# Patient Record
Sex: Female | Born: 1935
Health system: Southern US, Community
[De-identification: ages and names within clinical notes are randomized; demographics above are authoritative.]

## PROBLEM LIST (undated history)

## (undated) DIAGNOSIS — E7800 Pure hypercholesterolemia, unspecified: Secondary | ICD-10-CM

## (undated) DIAGNOSIS — K219 Gastro-esophageal reflux disease without esophagitis: Secondary | ICD-10-CM

## (undated) DIAGNOSIS — D649 Anemia, unspecified: Secondary | ICD-10-CM

## (undated) DIAGNOSIS — F329 Major depressive disorder, single episode, unspecified: Secondary | ICD-10-CM

## (undated) DIAGNOSIS — C50919 Malignant neoplasm of unspecified site of unspecified female breast: Secondary | ICD-10-CM

## (undated) DIAGNOSIS — M1990 Unspecified osteoarthritis, unspecified site: Secondary | ICD-10-CM

## (undated) DIAGNOSIS — Z801 Family history of malignant neoplasm of trachea, bronchus and lung: Secondary | ICD-10-CM

## (undated) DIAGNOSIS — G2581 Restless legs syndrome: Secondary | ICD-10-CM

## (undated) DIAGNOSIS — F32A Depression, unspecified: Secondary | ICD-10-CM

## (undated) DIAGNOSIS — F419 Anxiety disorder, unspecified: Secondary | ICD-10-CM

## (undated) DIAGNOSIS — I10 Essential (primary) hypertension: Secondary | ICD-10-CM

## (undated) DIAGNOSIS — R51 Headache: Secondary | ICD-10-CM

## (undated) DIAGNOSIS — C801 Malignant (primary) neoplasm, unspecified: Secondary | ICD-10-CM

## (undated) DIAGNOSIS — Z803 Family history of malignant neoplasm of breast: Secondary | ICD-10-CM

## (undated) DIAGNOSIS — K8020 Calculus of gallbladder without cholecystitis without obstruction: Secondary | ICD-10-CM

## (undated) DIAGNOSIS — R519 Headache, unspecified: Secondary | ICD-10-CM

## (undated) DIAGNOSIS — Z923 Personal history of irradiation: Secondary | ICD-10-CM

## (undated) DIAGNOSIS — N390 Urinary tract infection, site not specified: Secondary | ICD-10-CM

## (undated) HISTORY — PX: COLONOSCOPY: SHX174

## (undated) HISTORY — PX: ABDOMINAL HYSTERECTOMY: SHX81

## (undated) HISTORY — PX: EYE SURGERY: SHX253

## (undated) HISTORY — DX: Calculus of gallbladder without cholecystitis without obstruction: K80.20

## (undated) HISTORY — PX: BREAST SURGERY: SHX581

## (undated) HISTORY — PX: BREAST LUMPECTOMY: SHX2

## (undated) HISTORY — DX: Urinary tract infection, site not specified: N39.0

## (undated) HISTORY — PX: BREAST BIOPSY: SHX20

## (undated) HISTORY — PX: BREAST CYST EXCISION: SHX579

## (undated) HISTORY — DX: Family history of malignant neoplasm of trachea, bronchus and lung: Z80.1

## (undated) HISTORY — DX: Family history of malignant neoplasm of breast: Z80.3

## (undated) HISTORY — PX: MUSCLE BIOPSY: SHX716

## (undated) HISTORY — PX: CHOLECYSTECTOMY: SHX55

---

## 2000-11-05 ENCOUNTER — Ambulatory Visit (HOSPITAL_COMMUNITY): Admission: RE | Admit: 2000-11-05 | Discharge: 2000-11-05 | Payer: Self-pay | Admitting: Internal Medicine

## 2000-11-05 ENCOUNTER — Encounter: Payer: Self-pay | Admitting: Internal Medicine

## 2002-01-04 ENCOUNTER — Ambulatory Visit (HOSPITAL_COMMUNITY): Admission: RE | Admit: 2002-01-04 | Discharge: 2002-01-04 | Payer: Self-pay | Admitting: Internal Medicine

## 2002-01-04 ENCOUNTER — Encounter: Payer: Self-pay | Admitting: Internal Medicine

## 2003-05-09 ENCOUNTER — Ambulatory Visit (HOSPITAL_COMMUNITY): Admission: RE | Admit: 2003-05-09 | Discharge: 2003-05-09 | Payer: Self-pay | Admitting: Internal Medicine

## 2004-09-03 ENCOUNTER — Ambulatory Visit (HOSPITAL_COMMUNITY): Admission: RE | Admit: 2004-09-03 | Discharge: 2004-09-03 | Payer: Self-pay | Admitting: Internal Medicine

## 2004-10-03 ENCOUNTER — Ambulatory Visit: Payer: Self-pay | Admitting: Internal Medicine

## 2004-10-28 ENCOUNTER — Ambulatory Visit (HOSPITAL_COMMUNITY): Admission: RE | Admit: 2004-10-28 | Discharge: 2004-10-28 | Payer: Self-pay | Admitting: Internal Medicine

## 2004-10-28 ENCOUNTER — Ambulatory Visit: Payer: Self-pay | Admitting: Internal Medicine

## 2005-11-10 ENCOUNTER — Ambulatory Visit (HOSPITAL_COMMUNITY): Admission: RE | Admit: 2005-11-10 | Discharge: 2005-11-10 | Payer: Self-pay | Admitting: Internal Medicine

## 2006-12-24 ENCOUNTER — Ambulatory Visit (HOSPITAL_COMMUNITY): Admission: RE | Admit: 2006-12-24 | Discharge: 2006-12-24 | Payer: Self-pay | Admitting: Internal Medicine

## 2007-10-27 ENCOUNTER — Other Ambulatory Visit: Admission: RE | Admit: 2007-10-27 | Discharge: 2007-10-27 | Payer: Self-pay | Admitting: Obstetrics and Gynecology

## 2007-12-29 ENCOUNTER — Ambulatory Visit (HOSPITAL_COMMUNITY): Admission: RE | Admit: 2007-12-29 | Discharge: 2007-12-29 | Payer: Self-pay | Admitting: Internal Medicine

## 2008-12-29 ENCOUNTER — Ambulatory Visit (HOSPITAL_COMMUNITY): Admission: RE | Admit: 2008-12-29 | Discharge: 2008-12-29 | Payer: Self-pay | Admitting: Internal Medicine

## 2010-01-03 ENCOUNTER — Ambulatory Visit (HOSPITAL_COMMUNITY): Admission: RE | Admit: 2010-01-03 | Discharge: 2010-01-03 | Payer: Self-pay | Admitting: Internal Medicine

## 2010-08-23 NOTE — Op Note (Signed)
NAME:  Julie Sosa, Julie Sosa              ACCOUNT NO.:  [id number]   MEDICAL RECORD NO.:  [id number]          PATIENT TYPE:  AMB   LOCATION:  DAY                           FACILITY:  [facility]   PHYSICIAN:  R. Roetta Sessions, M.D. DATE OF BIRTH:  1935-10-13   DATE OF PROCEDURE:  10/28/2004  DATE OF DISCHARGE:                                 OPERATIVE REPORT   PROCEDURE:  Colonoscopy.   ENDOSCOPIST:  Jonathon Bellows, M.D.   INDICATIONS FOR PROCEDURE:  The patient is a 75 year old lady sent from Dr.  Ouida Sills for further evaluation of hemoccult positive stool found on digital  rectal exam.  [name] is devoid of any GI symptoms.  She is not having any  melena or hematochezia.  She had a sigmoidoscopy several years ago without  significant findings (Dr. Ouida Sills).  No family history of colorectal  neoplasia.  Colonoscopy is now being done.  This procedure was discussed  with the patient at length.  Potential risks, benefits, alternatives, and  maneuvers with any questions answered.  She is agreeable to proceed.  Oxygen  saturation, blood pressure, and pulse were monitored throughout the entire  procedure.   SEDATION:  Conscious sedation with Versed 3 mg intravenously, Demerol 50 mg  intravenously in divided doses.   INSTRUMENT:  Olympus video system.   FINDINGS:  Digital rectal exam revealed no abnormalities.  The prep was  adequate in the rectum.  Examination of the rectal mucosa including  retroflexed view of the anal verge revealed a couple of internal  hemorrhoids.  One was somewhat juicy, otherwise, her rectal mucosa appeared  normal.  Colonic mucosa was surveyed from the rectosigmoid junction to the  low transverse right colon, appendiceal orifice, ileocecal valve and  cecum.  The structures were seen and photographed for the record.  The Olympus scope  was slowly withdrawn and previously mentioned gastric surfaces were again  seen.  The patient was noted to have sigmoid diverticula.  Colonic  mucosa  appeared normal.  Attempt to intubate the terminal ileum was unsuccessful.  The patient tolerated the procedure well and was reacting in Endoscopy.   IMPRESSION:  1.  Internal hemorrhoids, otherwise normal rectum.  2.  Left-sided diverticula; colonic mucosa appeared normal.   RECOMMENDATIONS:  Will check a CBC today and see where we stand.  If she is  anemic, she will need further evaluation.  However, hemoccult positive stool  could conceivably have been related to the presence of internal hemorrhoids.       RMR/MEDQ  D:  10/28/2004  T:  10/28/2004  Job:  [id number]   cc:   Kingsley Callander. Ouida Sills, MD  [address]  [place]  [place] [address]  Fax: [phone]

## 2010-08-23 NOTE — Consult Note (Signed)
NAME:  Julie Sosa, Julie Sosa              ACCOUNT NO.:  [id number]   MEDICAL RECORD NO.:  [id number]          PATIENT TYPE:  AMB   LOCATION:  DAY                           FACILITY:  [facility]   PHYSICIAN:  R. Roetta Sessions, M.D. DATE OF BIRTH:  10/04/1935   DATE OF CONSULTATION:  DATE OF DISCHARGE:                                   CONSULTATION   REASON FOR CONSULTATION:  Heme-positive stools.   PHYSICIAN REQUESTING CONSULTATION:  Dr. Carylon Perches.   PHYSICIAN COSIGNING NOTE:  Dr. Roetta Sessions.   HISTORY OF PRESENT ILLNESS:  The patient is a 75 year old Caucasian female  patient of Dr. Carylon Perches, who presents today for further evaluation of  hemoccult-positive stool recently found on rectal examination.  She denies  any melena or gross hematochezia.  Her bowel movements sometimes are  irregular.  She has increased her dietary fiber intake and has been more  regular of lately.  She does have some fecal urgency at times.  Sometimes  she feels like she has incomplete rectal evacuation.  Denies any abdominal  pain, nausea, or vomiting.  She rarely has heartburn.  She takes Advil only  occasionally for arthritis.  She has never had a colonoscopy, but had a  flexible sigmoidoscopy by Dr. Ouida Sills in 1999.   CURRENT MEDICATIONS:  1.  Zoloft 100 mg 1/2 tablet daily.  2.  Crestor 10 mg daily.  3.  Hydrochlorothiazide 12.5 mg daily.  4.  Potassium chloride 20 mEq daily.  5.  Atenolol 50 mg daily.  6.  Requip 0.5 mg daily.  7.  Advil occasionally.   ALLERGIES:  No known drug allergies.   PAST MEDICAL HISTORY:  1.  Hypertension.  2.  Hypercholesterolemia.  3.  Depression.  4.  Restless legs syndrome.  5.  Arthritis.  6.  Status post partial hysterectomy, cholecystectomy, and left breast      biopsy, which was benign.   FAMILY HISTORY:  She has a sister, who had throat cancer and history of DVT.  She has another sister, who was treated for cervical dysplasia.  She has a  granddaughter, who is  currently being treated for cervical dysplasia.   SOCIAL HISTORY:  She is married and has two daughters.  She is retired.  She  has never been a smoker.  She denies any alcohol use.   REVIEW OF SYSTEMS:  See HPI for GI.  Constitutional:  Denies weight loss.  Cardiopulmonary:  Denies any chest pain or shortness of breath.   PHYSICAL EXAMINATION:  Weight 164, height 5 feet 4 inches, temperature 98.3,  blood pressure 124/64, pulse 64.  General:  Pleasant well-nourished well-  developed Caucasian female in no acute distress.  Skin:  Warm and dry.  No  jaundice.  HEENT:  Conjunctivae are pink.  Sclerae are nonicteric.  Oropharyngeal mucosa moist and pink.  No lesions, erythema, or exudate.  No  lymphadenopathy or thyromegaly.  Chest:  Lungs are clear to auscultation.  Cardiac exam reveals regular rate and rhythm, normal S1/S2, no murmurs,  rubs, or gallops.  Abdomen:  Positive bowel sounds, soft, nontender,  nondistended, no organomegaly or masses.  No rebound tenderness or guarding.  Extremities:  No edema.   IMPRESSION:  Julie Sosa is a 75 year old lady, who recently was found to  have Hemoccult-positive stool on digital rectal examination. She has never  had a complete colonoscopy.  She needs to have a colonoscopy at this time to  rule out colorectal polyps or colorectal cancer.  She is otherwise devoid of  any significant GI symptoms or alarm symptoms.  Denies any upper GI symptoms  at all.  I have discussed risks, alternatives, and benefits with regards to  colonoscopy and including risk of medication reactions, incomplete exam,  bleeding, infection, perforation, and she is agreeable to proceed.   PLAN:  Colonoscopy with Dr. Jena Gauss in the near future.       LL/MEDQ  D:  10/03/2004  T:  10/03/2004  Job:  [id number]   cc:   Kingsley Callander. Ouida Sills, MD  [address]  [place]  [place] [address]  Fax: [phone]

## 2010-11-13 DIAGNOSIS — C439 Malignant melanoma of skin, unspecified: Secondary | ICD-10-CM

## 2010-11-13 HISTORY — DX: Malignant melanoma of skin, unspecified: C43.9

## 2011-01-13 ENCOUNTER — Other Ambulatory Visit (HOSPITAL_COMMUNITY): Payer: Self-pay | Admitting: Internal Medicine

## 2011-01-13 DIAGNOSIS — Z139 Encounter for screening, unspecified: Secondary | ICD-10-CM

## 2011-01-16 ENCOUNTER — Ambulatory Visit (HOSPITAL_COMMUNITY)
Admission: RE | Admit: 2011-01-16 | Discharge: 2011-01-16 | Disposition: A | Discharge status: Home / Self Care | Payer: Medicare HMO | Source: Ambulatory Visit | Attending: Internal Medicine | Admitting: Internal Medicine

## 2011-01-16 DIAGNOSIS — Z139 Encounter for screening, unspecified: Secondary | ICD-10-CM

## 2011-01-16 DIAGNOSIS — Z1231 Encounter for screening mammogram for malignant neoplasm of breast: Secondary | ICD-10-CM | POA: Insufficient documentation

## 2011-04-17 ENCOUNTER — Ambulatory Visit (HOSPITAL_COMMUNITY)
Admission: RE | Admit: 2011-04-17 | Discharge: 2011-04-17 | Disposition: A | Discharge status: Home / Self Care | Payer: MEDICARE | Source: Ambulatory Visit | Attending: Internal Medicine | Admitting: Internal Medicine

## 2011-04-17 ENCOUNTER — Other Ambulatory Visit (HOSPITAL_COMMUNITY): Payer: Self-pay | Admitting: Internal Medicine

## 2011-04-17 DIAGNOSIS — R05 Cough: Secondary | ICD-10-CM

## 2011-04-17 DIAGNOSIS — R059 Cough, unspecified: Secondary | ICD-10-CM | POA: Insufficient documentation

## 2011-12-26 ENCOUNTER — Other Ambulatory Visit (HOSPITAL_COMMUNITY): Payer: Self-pay | Admitting: Internal Medicine

## 2011-12-26 DIAGNOSIS — Z139 Encounter for screening, unspecified: Secondary | ICD-10-CM

## 2012-01-19 ENCOUNTER — Ambulatory Visit (HOSPITAL_COMMUNITY)
Admission: RE | Admit: 2012-01-19 | Discharge: 2012-01-19 | Disposition: A | Discharge status: Home / Self Care | Payer: Medicare Other | Source: Ambulatory Visit | Attending: Internal Medicine | Admitting: Internal Medicine

## 2012-01-19 DIAGNOSIS — Z1231 Encounter for screening mammogram for malignant neoplasm of breast: Secondary | ICD-10-CM | POA: Insufficient documentation

## 2012-01-19 DIAGNOSIS — Z139 Encounter for screening, unspecified: Secondary | ICD-10-CM

## 2012-02-24 ENCOUNTER — Encounter (HOSPITAL_COMMUNITY): Payer: Self-pay | Admitting: Pharmacy Technician

## 2012-03-01 ENCOUNTER — Encounter (HOSPITAL_COMMUNITY)
Admission: RE | Admit: 2012-03-01 | Discharge: 2012-03-01 | Payer: Medicare Other | Source: Ambulatory Visit | Attending: Ophthalmology | Admitting: Ophthalmology

## 2012-03-01 ENCOUNTER — Encounter (HOSPITAL_COMMUNITY): Payer: Self-pay

## 2012-03-01 ENCOUNTER — Other Ambulatory Visit: Payer: Self-pay

## 2012-03-01 HISTORY — DX: Anxiety disorder, unspecified: F41.9

## 2012-03-01 HISTORY — DX: Pure hypercholesterolemia, unspecified: E78.00

## 2012-03-01 HISTORY — DX: Restless legs syndrome: G25.81

## 2012-03-01 HISTORY — DX: Major depressive disorder, single episode, unspecified: F32.9

## 2012-03-01 HISTORY — DX: Essential (primary) hypertension: I10

## 2012-03-01 HISTORY — DX: Unspecified osteoarthritis, unspecified site: M19.90

## 2012-03-01 HISTORY — DX: Depression, unspecified: F32.A

## 2012-03-01 LAB — BASIC METABOLIC PANEL
BUN: 14 mg/dL (ref 6–23)
GFR calc Af Amer: 61 mL/min — ABNORMAL LOW (ref >90)
GFR calc non Af Amer: 53 mL/min — ABNORMAL LOW (ref >90)
Potassium: 3.8 mEq/L (ref 3.5–5.1)
Sodium: 137 mEq/L (ref 135–145)

## 2012-03-01 LAB — HEMOGLOBIN AND HEMATOCRIT, BLOOD
HCT: 37.6 % (ref 36.0–46.0)
Hemoglobin: 12.6 g/dL (ref 12.0–15.0)

## 2012-03-01 NOTE — Patient Instructions (Addendum)
Your procedure is scheduled on: 03/11/2012  Report to [facility] at   800     AM.  Call this number if you have problems the morning of surgery: [phone]   Do not eat food or drink liquids :After Midnight.      Take these medicines the morning of surgery with A SIP OF WATER: atenolol,zoloft,clonazepam   Do not wear jewelry, make-up or nail polish.  Do not wear lotions, powders, or perfumes. You may wear deodorant.  Do not shave 48 hours prior to surgery.  Do not bring valuables to the hospital.  Contacts, dentures or bridgework may not be worn into surgery.  Leave suitcase in the car. After surgery it may be brought to your room.  For patients admitted to the hospital, checkout time is 11:00 AM the day of discharge.   Patients discharged the day of surgery will not be allowed to drive home.  :     Please read over the following fact sheets that you were given: Coughing and Deep Breathing, Surgical Site Infection Prevention, Anesthesia Post-op Instructions and Care and Recovery After Surgery    Cataract A cataract is a clouding of the lens of the eye. When a lens becomes cloudy, vision is reduced based on the degree and nature of the clouding. Many cataracts reduce vision to some degree. Some cataracts make people more near-sighted as they develop. Other cataracts increase glare. Cataracts that are ignored and become worse can sometimes look white. The white color can be seen through the pupil. CAUSES   Aging. However, cataracts may occur at any age, even in newborns.   Certain drugs.   Trauma to the eye.   Certain diseases such as diabetes.   Specific eye diseases such as chronic inflammation inside the eye or a sudden attack of a rare form of glaucoma.   Inherited or acquired medical problems.  SYMPTOMS   Gradual, progressive drop in vision in the affected eye.   Severe, rapid visual loss. This most often happens when trauma is the cause.  DIAGNOSIS  To detect a cataract,  an eye doctor examines the lens. Cataracts are best diagnosed with an exam of the eyes with the pupils enlarged (dilated) by drops.  TREATMENT  For an early cataract, vision may improve by using different eyeglasses or stronger lighting. If that does not help your vision, surgery is the only effective treatment. A cataract needs to be surgically removed when vision loss interferes with your everyday activities, such as driving, reading, or watching TV. A cataract may also have to be removed if it prevents examination or treatment of another eye problem. Surgery removes the cloudy lens and usually replaces it with a substitute lens (intraocular lens, IOL).  At a time when both you and your doctor agree, the cataract will be surgically removed. If you have cataracts in both eyes, only one is usually removed at a time. This allows the operated eye to heal and be out of danger from any possible problems after surgery (such as infection or poor wound healing). In rare cases, a cataract may be doing damage to your eye. In these cases, your caregiver may advise surgical removal right away. The vast majority of people who have cataract surgery have better vision afterward. HOME CARE INSTRUCTIONS  If you are not planning surgery, you may be asked to do the following:  Use different eyeglasses.   Use stronger or brighter lighting.   Ask your eye doctor about reducing  your medicine dose or changing medicines if it is thought that a medicine caused your cataract. Changing medicines does not make the cataract go away on its own.   Become familiar with your surroundings. Poor vision can lead to injury. Avoid bumping into things on the affected side. You are at a higher risk for tripping or falling.   Exercise extreme care when driving or operating machinery.   Wear sunglasses if you are sensitive to bright light or experiencing problems with glare.  SEEK IMMEDIATE MEDICAL CARE IF:   You have a worsening or  sudden vision loss.   You notice redness, swelling, or increasing pain in the eye.   You have a fever.  Document Released: 03/24/2005 Document Revised: 03/13/2011 Document Reviewed: 11/15/2010 [facility] Patient Information 2012 [place], [place].PATIENT INSTRUCTIONS POST-ANESTHESIA  IMMEDIATELY FOLLOWING SURGERY:  Do not drive or operate machinery for the first twenty four hours after surgery.  Do not make any important decisions for twenty four hours after surgery or while taking narcotic pain medications or sedatives.  If you develop intractable nausea and vomiting or a severe headache please notify your doctor immediately.  FOLLOW-UP:  Please make an appointment with your surgeon as instructed. You do not need to follow up with anesthesia unless specifically instructed to do so.  WOUND CARE INSTRUCTIONS (if applicable):  Keep a dry clean dressing on the anesthesia/puncture wound site if there is drainage.  Once the wound has quit draining you may leave it open to air.  Generally you should leave the bandage intact for twenty four hours unless there is drainage.  If the epidural site drains for more than 36-48 hours please call the anesthesia department.  QUESTIONS?:  Please feel free to call your physician or the hospital operator if you have any questions, and they will be happy to assist you.

## 2012-03-10 MED ORDER — LIDOCAINE HCL (PF) 1 % IJ SOLN
INTRAMUSCULAR | Status: AC
  Filled 2012-03-10: qty 2

## 2012-03-10 MED ORDER — PHENYLEPHRINE HCL 2.5 % OP SOLN
OPHTHALMIC | Status: AC
  Filled 2012-03-10: qty 2

## 2012-03-10 MED ORDER — LIDOCAINE HCL 3.5 % OP GEL
OPHTHALMIC | Status: AC
  Filled 2012-03-10: qty 5

## 2012-03-10 MED ORDER — CYCLOPENTOLATE-PHENYLEPHRINE 0.2-1 % OP SOLN
OPHTHALMIC | Status: AC
  Filled 2012-03-10: qty 2

## 2012-03-10 MED ORDER — TETRACAINE HCL 0.5 % OP SOLN
OPHTHALMIC | Status: AC
  Filled 2012-03-10: qty 2

## 2012-03-10 MED ORDER — NEOMYCIN-POLYMYXIN-DEXAMETH 3.5-10000-0.1 OP OINT
TOPICAL_OINTMENT | OPHTHALMIC | Status: AC
  Filled 2012-03-10: qty 3.5

## 2012-03-11 ENCOUNTER — Encounter (HOSPITAL_COMMUNITY): Payer: Self-pay | Admitting: *Deleted

## 2012-03-11 ENCOUNTER — Encounter (HOSPITAL_COMMUNITY): Payer: Self-pay | Admitting: Anesthesiology

## 2012-03-11 ENCOUNTER — Ambulatory Visit (HOSPITAL_COMMUNITY): Payer: Medicare Other | Admitting: Anesthesiology

## 2012-03-11 ENCOUNTER — Ambulatory Visit (HOSPITAL_COMMUNITY)
Admission: RE | Admit: 2012-03-11 | Discharge: 2012-03-11 | Disposition: A | Discharge status: Home / Self Care | Payer: Medicare Other | Source: Ambulatory Visit | Attending: Ophthalmology | Admitting: Ophthalmology

## 2012-03-11 ENCOUNTER — Encounter (HOSPITAL_COMMUNITY)
Admission: RE | Disposition: A | Discharge status: Home / Self Care | Payer: Self-pay | Source: Ambulatory Visit | Attending: Ophthalmology

## 2012-03-11 DIAGNOSIS — Z01812 Encounter for preprocedural laboratory examination: Secondary | ICD-10-CM | POA: Insufficient documentation

## 2012-03-11 DIAGNOSIS — I10 Essential (primary) hypertension: Secondary | ICD-10-CM | POA: Insufficient documentation

## 2012-03-11 DIAGNOSIS — Z01810 Encounter for preprocedural cardiovascular examination: Secondary | ICD-10-CM | POA: Insufficient documentation

## 2012-03-11 DIAGNOSIS — H2510 Age-related nuclear cataract, unspecified eye: Secondary | ICD-10-CM | POA: Insufficient documentation

## 2012-03-11 HISTORY — PX: CATARACT EXTRACTION W/PHACO: SHX586

## 2012-03-11 SURGERY — PHACOEMULSIFICATION, CATARACT, WITH IOL INSERTION
Anesthesia: Monitor Anesthesia Care | Site: Eye | Laterality: Right | Wound class: Clean

## 2012-03-11 MED ORDER — LIDOCAINE HCL (PF) 1 % IJ SOLN
INTRAMUSCULAR | Status: DC | PRN
  Administered 2012-03-11: .4 mL

## 2012-03-11 MED ORDER — LACTATED RINGERS IV SOLN
INTRAVENOUS | Status: DC
  Administered 2012-03-11: 08:00:00 via INTRAVENOUS

## 2012-03-11 MED ORDER — LIDOCAINE HCL 3.5 % OP GEL
1.0000 "application " | Freq: Once | OPHTHALMIC | Status: AC
  Administered 2012-03-11: 1 via OPHTHALMIC

## 2012-03-11 MED ORDER — CYCLOPENTOLATE-PHENYLEPHRINE 0.2-1 % OP SOLN
1.0000 [drp] | OPHTHALMIC | Status: AC
  Administered 2012-03-11 (×3): 1 [drp] via OPHTHALMIC

## 2012-03-11 MED ORDER — MIDAZOLAM HCL 2 MG/2ML IJ SOLN
INTRAMUSCULAR | Status: AC
  Filled 2012-03-11: qty 2

## 2012-03-11 MED ORDER — POVIDONE-IODINE 5 % OP SOLN
OPHTHALMIC | Status: DC | PRN
  Administered 2012-03-11: 1 via OPHTHALMIC

## 2012-03-11 MED ORDER — EPINEPHRINE HCL 1 MG/ML IJ SOLN
INTRAMUSCULAR | Status: AC
  Filled 2012-03-11: qty 1

## 2012-03-11 MED ORDER — NEOMYCIN-POLYMYXIN-DEXAMETH 0.1 % OP OINT
TOPICAL_OINTMENT | OPHTHALMIC | Status: DC | PRN
  Administered 2012-03-11: 1 via OPHTHALMIC

## 2012-03-11 MED ORDER — PHENYLEPHRINE HCL 2.5 % OP SOLN
1.0000 [drp] | OPHTHALMIC | Status: AC
  Administered 2012-03-11 (×3): 1 [drp] via OPHTHALMIC

## 2012-03-11 MED ORDER — LIDOCAINE 3.5 % OP GEL OPTIME - NO CHARGE
OPHTHALMIC | Status: DC | PRN
  Administered 2012-03-11: 1 [drp] via OPHTHALMIC

## 2012-03-11 MED ORDER — EPINEPHRINE HCL 1 MG/ML IJ SOLN
INTRAOCULAR | Status: DC | PRN
  Administered 2012-03-11: 09:00:00

## 2012-03-11 MED ORDER — MIDAZOLAM HCL 2 MG/2ML IJ SOLN
1.0000 mg | INTRAMUSCULAR | Status: AC | PRN
  Administered 2012-03-11 (×2): 1 mg via INTRAVENOUS
  Administered 2012-03-11: 2 mg via INTRAVENOUS

## 2012-03-11 MED ORDER — TETRACAINE HCL 0.5 % OP SOLN
1.0000 [drp] | OPHTHALMIC | Status: AC
  Administered 2012-03-11 (×3): 1 [drp] via OPHTHALMIC

## 2012-03-11 MED ORDER — PROVISC 10 MG/ML IO SOLN
INTRAOCULAR | Status: DC | PRN
  Administered 2012-03-11: 8.5 mg via INTRAOCULAR

## 2012-03-11 MED ORDER — BSS IO SOLN
INTRAOCULAR | Status: DC | PRN
  Administered 2012-03-11: 15 mL via INTRAOCULAR

## 2012-03-11 SURGICAL SUPPLY — 32 items

## 2012-03-11 NOTE — Anesthesia Postprocedure Evaluation (Signed)
  Anesthesia Post-op Note  Patient: Julie Sosa  Procedure(s) Performed: Procedure(s) (LRB) with comments: CATARACT EXTRACTION PHACO AND INTRAOCULAR LENS PLACEMENT (IOC) (Right) - CDE:17.71  Patient Location: Short Stay  Anesthesia Type:MAC  Level of Consciousness: awake, alert , oriented and patient cooperative  Airway and Oxygen Therapy: Patient Spontanous Breathing  Post-op Pain: none  Post-op Assessment: Post-op Vital signs reviewed, Patient's Cardiovascular Status Stable, Respiratory Function Stable, Patent Airway, No signs of Nausea or vomiting and Adequate PO intake  Post-op Vital Signs: Reviewed and stable  Complications: No apparent anesthesia complications

## 2012-03-11 NOTE — Brief Op Note (Signed)
Pre-Op Dx: Cataract OD Post-Op Dx: Cataract OD Surgeon: Cortina Vultaggio Anesthesia: Topical with MAC Surgery: Cataract Extraction with Intraocular lens Implant OD Implant: B&L enVista Specimen: None Complications: None 

## 2012-03-11 NOTE — Op Note (Signed)
NAME:  Julie Sosa, Julie Sosa              ACCOUNT NO.:  [id number]  MEDICAL RECORD NO.:  [id number]  LOCATION:  APPO                          FACILITY:  [facility]  PHYSICIAN:  Susanne Greenhouse, MD       DATE OF BIRTH:  1935/09/15  DATE OF PROCEDURE:  03/11/2012 DATE OF DISCHARGE:  03/11/2012                              OPERATIVE REPORT   PREOPERATIVE DIAGNOSIS:  Nuclear cataract, right eye, diagnosis code 366.16.  POSTOPERATIVE DIAGNOSIS:  Nuclear cataract, right eye, diagnosis code 366.16.  SURGEON:  Susanne Greenhouse, MD  OPERATION PERFORMED:  Phacoemulsification with posterior chamber intraocular lens implantation, right eye.  ANESTHESIA:  Topical with monitored anesthesia care and IV sedation.  OPERATIVE SUMMARY:  In the preoperative area, dilating drops were placed into the right eye.  The patient was then brought into the operating room where she was placed under topical anesthesia and IV sedation.  The eye was then prepped and draped.  Beginning with a 75 blade, a paracentesis port was made at the surgeon's 2 o'clock position.  The anterior chamber was then filled with a 1% nonpreserved lidocaine solution with epinephrine.  This was followed by Viscoat to deepen the chamber.  A small fornix-based peritomy was performed superiorly.  Next, a single iris hook was placed through the limbus superiorly.  A 2.4-mm keratome blade was then used to make a clear corneal incision over the iris hook.  A bent cystotome needle and Utrata forceps were used to create a continuous tear capsulotomy.  Hydrodissection was performed using balanced salt solution on a fine cannula.  The lens nucleus was then removed using phacoemulsification in a quadrant cracking technique. The cortical material was then removed with irrigation and aspiration. The capsular bag and anterior chamber were refilled with Provisc.  The wound was widened to approximately 3 mm and a posterior chamber intraocular lens was placed into  the capsular bag without difficulty using an [employer] lens injecting system.  A single 10-0 nylon suture was then used to close the incision as well as stromal hydration. The Provisc was removed from the anterior chamber and capsular bag with irrigation and aspiration.  At this point, the wounds were tested for leak, which were negative.  The anterior chamber remained deep and stable.  The patient tolerated the procedure well.  There were no operative complications, and she awoke from topical anesthesia and IV sedation without problem.  No surgical specimens.  Prosthetic device used is a Bausch and Lomb posterior chamber lens, model enVista, model number MX60, power of 23.5, serial number is [id number].          ______________________________ Susanne Greenhouse, MD     KEH/MEDQ  D:  03/11/2012  T:  03/11/2012  Job:  [id number]

## 2012-03-11 NOTE — H&P (Signed)
I have reviewed the H&P, the patient was re-examined, and I have identified no interval changes in medical condition and plan of care since the history and physical of record  

## 2012-03-11 NOTE — Transfer of Care (Signed)
Immediate Anesthesia Transfer of Care Note  Patient: Julie Sosa  Procedure(s) Performed: Procedure(s) (LRB) with comments: CATARACT EXTRACTION PHACO AND INTRAOCULAR LENS PLACEMENT (IOC) (Right) - CDE:17.71  Patient Location: Short Stay  Anesthesia Type:MAC  Level of Consciousness: awake, alert , oriented and patient cooperative  Airway & Oxygen Therapy: Patient Spontanous Breathing  Post-op Assessment: Report given to PACU RN, Post -op Vital signs reviewed and stable and Patient moving all extremities  Post vital signs: Reviewed and stable  Complications: No apparent anesthesia complications

## 2012-03-11 NOTE — Anesthesia Preprocedure Evaluation (Signed)
Anesthesia Evaluation  Patient identified by MRN, date of birth, ID band Patient awake    Reviewed: Allergy & Precautions, H&P , NPO status , Patient's Chart, lab work & pertinent test results, reviewed documented beta blocker date and time   Airway Mallampati: II      Dental  (+) Teeth Intact   Pulmonary neg pulmonary ROS,  breath sounds clear to auscultation        Cardiovascular hypertension, Pt. on medications Rhythm:Regular Rate:Normal     Neuro/Psych PSYCHIATRIC DISORDERS Anxiety Depression    GI/Hepatic negative GI ROS,   Endo/Other    Renal/GU      Musculoskeletal   Abdominal   Peds  Hematology   Anesthesia Other Findings   Reproductive/Obstetrics                           Anesthesia Physical Anesthesia Plan  ASA: II  Anesthesia Plan: MAC   Post-op Pain Management:    Induction: Intravenous  Airway Management Planned: Nasal Cannula  Additional Equipment:   Intra-op Plan:   Post-operative Plan:   Informed Consent: I have reviewed the patients History and Physical, chart, labs and discussed the procedure including the risks, benefits and alternatives for the proposed anesthesia with the patient or authorized representative who has indicated his/her understanding and acceptance.     Plan Discussed with:   Anesthesia Plan Comments:         Anesthesia Quick Evaluation

## 2012-03-15 ENCOUNTER — Encounter (HOSPITAL_COMMUNITY): Payer: Self-pay | Admitting: Ophthalmology

## 2012-03-19 ENCOUNTER — Encounter (HOSPITAL_COMMUNITY): Payer: Self-pay | Admitting: Pharmacy Technician

## 2012-03-23 ENCOUNTER — Encounter (HOSPITAL_COMMUNITY)
Admission: RE | Admit: 2012-03-23 | Discharge: 2012-03-23 | Payer: Medicare Other | Source: Ambulatory Visit | Admitting: Ophthalmology

## 2012-03-23 ENCOUNTER — Encounter (HOSPITAL_COMMUNITY): Payer: Self-pay

## 2012-03-23 NOTE — Patient Instructions (Signed)
[address] [name]  03/23/2012   Your procedure is scheduled on:  03/29/2012  Report to [facility] at 7:30 am AM.  Call this number if you have problems the morning of surgery: [phone]   Remember:   Do not eat food:After Midnight.  May have clear liquids:until Midnight .  Clear liquids include soda, tea, black coffee, apple or grape juice, broth.  Take these medicines the morning of surgery with A SIP OF WATER: atenolol   Do not wear jewelry, make-up or nail polish.  Do not wear lotions, powders, or perfumes. You may wear deodorant.  Do not shave 48 hours prior to surgery. Men may shave face and neck.  Do not bring valuables to the hospital.  Contacts, dentures or bridgework may not be worn into surgery.  Leave suitcase in the car. After surgery it may be brought to your room.  For patients admitted to the hospital, checkout time is 11:00 AM the day of discharge.   Patients discharged the day of surgery will not be allowed to drive home.  Name and phone number of your driver: Family  Special Instructions: N/A   Please read over the following fact sheets that you were given: Care and Recovery After Surgery

## 2012-03-26 MED ORDER — ONDANSETRON HCL 4 MG/2ML IJ SOLN: 4.0000 mg | Freq: Once | INTRAMUSCULAR | Status: AC | PRN

## 2012-03-26 MED ORDER — FENTANYL CITRATE 0.05 MG/ML IJ SOLN: 25.0000 ug | INTRAMUSCULAR | Status: DC | PRN

## 2012-03-29 ENCOUNTER — Ambulatory Visit (HOSPITAL_COMMUNITY): Payer: Medicare Other | Admitting: Anesthesiology

## 2012-03-29 ENCOUNTER — Encounter (HOSPITAL_COMMUNITY): Payer: Self-pay | Admitting: *Deleted

## 2012-03-29 ENCOUNTER — Ambulatory Visit (HOSPITAL_COMMUNITY)
Admission: RE | Admit: 2012-03-29 | Discharge: 2012-03-29 | Disposition: A | Discharge status: Home / Self Care | Payer: Medicare Other | Source: Ambulatory Visit | Attending: Ophthalmology | Admitting: Ophthalmology

## 2012-03-29 ENCOUNTER — Encounter (HOSPITAL_COMMUNITY)
Admission: RE | Disposition: A | Discharge status: Home / Self Care | Payer: Self-pay | Source: Ambulatory Visit | Attending: Ophthalmology

## 2012-03-29 ENCOUNTER — Encounter (HOSPITAL_COMMUNITY): Payer: Self-pay | Admitting: Anesthesiology

## 2012-03-29 DIAGNOSIS — H2510 Age-related nuclear cataract, unspecified eye: Secondary | ICD-10-CM | POA: Insufficient documentation

## 2012-03-29 DIAGNOSIS — I10 Essential (primary) hypertension: Secondary | ICD-10-CM | POA: Insufficient documentation

## 2012-03-29 HISTORY — PX: CATARACT EXTRACTION W/PHACO: SHX586

## 2012-03-29 SURGERY — PHACOEMULSIFICATION, CATARACT, WITH IOL INSERTION
Anesthesia: Monitor Anesthesia Care | Site: Eye | Laterality: Left | Wound class: Clean

## 2012-03-29 MED ORDER — LACTATED RINGERS IV SOLN
INTRAVENOUS | Status: DC
  Administered 2012-03-29: 1000 mL via INTRAVENOUS

## 2012-03-29 MED ORDER — POVIDONE-IODINE 5 % OP SOLN
OPHTHALMIC | Status: DC | PRN
  Administered 2012-03-29: 1 via OPHTHALMIC

## 2012-03-29 MED ORDER — LIDOCAINE HCL 3.5 % OP GEL
1.0000 "application " | Freq: Once | OPHTHALMIC | Status: AC
  Administered 2012-03-29: 1 via OPHTHALMIC

## 2012-03-29 MED ORDER — MIDAZOLAM HCL 2 MG/2ML IJ SOLN
INTRAMUSCULAR | Status: AC
  Filled 2012-03-29: qty 2

## 2012-03-29 MED ORDER — TETRACAINE HCL 0.5 % OP SOLN
1.0000 [drp] | OPHTHALMIC | Status: AC
  Administered 2012-03-29 (×3): 1 [drp] via OPHTHALMIC

## 2012-03-29 MED ORDER — MIDAZOLAM HCL 2 MG/2ML IJ SOLN
1.0000 mg | INTRAMUSCULAR | Status: DC | PRN
  Administered 2012-03-29: 2 mg via INTRAVENOUS

## 2012-03-29 MED ORDER — CYCLOPENTOLATE-PHENYLEPHRINE 0.2-1 % OP SOLN
1.0000 [drp] | OPHTHALMIC | Status: AC
  Administered 2012-03-29 (×3): 1 [drp] via OPHTHALMIC

## 2012-03-29 MED ORDER — EPINEPHRINE HCL 1 MG/ML IJ SOLN
INTRAOCULAR | Status: DC | PRN
  Administered 2012-03-29: 09:00:00

## 2012-03-29 MED ORDER — PHENYLEPHRINE HCL 2.5 % OP SOLN
1.0000 [drp] | OPHTHALMIC | Status: AC
  Administered 2012-03-29 (×3): 1 [drp] via OPHTHALMIC

## 2012-03-29 MED ORDER — PROVISC 10 MG/ML IO SOLN
INTRAOCULAR | Status: DC | PRN
  Administered 2012-03-29: 8.5 mg via INTRAOCULAR

## 2012-03-29 MED ORDER — LIDOCAINE HCL (PF) 1 % IJ SOLN
INTRAMUSCULAR | Status: DC | PRN
  Administered 2012-03-29: .3 mL

## 2012-03-29 MED ORDER — BSS IO SOLN
INTRAOCULAR | Status: DC | PRN
  Administered 2012-03-29: 15 mL via INTRAOCULAR

## 2012-03-29 MED ORDER — NEOMYCIN-POLYMYXIN-DEXAMETH 0.1 % OP OINT
TOPICAL_OINTMENT | OPHTHALMIC | Status: DC | PRN
  Administered 2012-03-29: 1 via OPHTHALMIC

## 2012-03-29 SURGICAL SUPPLY — 32 items

## 2012-03-29 NOTE — Brief Op Note (Signed)
Pre-Op Dx: Cataract OS Post-Op Dx: Cataract OS Surgeon: Cherrise Occhipinti Anesthesia: Topical with MAC Surgery: Cataract Extraction with Intraocular lens Implant OS Implant: B&L enVista Specimen: None Complications: None 

## 2012-03-29 NOTE — H&P (Signed)
I have reviewed the H&P, the patient was re-examined, and I have identified no interval changes in medical condition and plan of care since the history and physical of record  

## 2012-03-29 NOTE — Anesthesia Procedure Notes (Signed)
Date/Time: 03/29/2012 8:42 AM Performed by: Franco Nones

## 2012-03-29 NOTE — Anesthesia Preprocedure Evaluation (Signed)
Anesthesia Evaluation  Patient identified by MRN, date of birth, ID band Patient awake    Reviewed: Allergy & Precautions, H&P , NPO status , Patient's Chart, lab work & pertinent test results, reviewed documented beta blocker date and time   Airway Mallampati: II      Dental  (+) Teeth Intact   Pulmonary neg pulmonary ROS,  breath sounds clear to auscultation        Cardiovascular hypertension, Pt. on medications Rhythm:Regular Rate:Normal     Neuro/Psych PSYCHIATRIC DISORDERS Anxiety Depression    GI/Hepatic negative GI ROS,   Endo/Other    Renal/GU      Musculoskeletal   Abdominal   Peds  Hematology   Anesthesia Other Findings   Reproductive/Obstetrics                           Anesthesia Physical Anesthesia Plan  ASA: II  Anesthesia Plan: MAC   Post-op Pain Management:    Induction: Intravenous  Airway Management Planned: Nasal Cannula  Additional Equipment:   Intra-op Plan:   Post-operative Plan:   Informed Consent: I have reviewed the patients History and Physical, chart, labs and discussed the procedure including the risks, benefits and alternatives for the proposed anesthesia with the patient or authorized representative who has indicated his/her understanding and acceptance.     Plan Discussed with:   Anesthesia Plan Comments:         Anesthesia Quick Evaluation  

## 2012-03-29 NOTE — Anesthesia Postprocedure Evaluation (Signed)
  Anesthesia Post-op Note  Patient: Julie Sosa  Procedure(s) Performed: Procedure(s) (LRB): CATARACT EXTRACTION PHACO AND INTRAOCULAR LENS PLACEMENT (IOC) (Left)  Patient Location:  Short Stay  Anesthesia Type: MAC  Level of Consciousness: awake  Airway and Oxygen Therapy: Patient Spontanous Breathing  Post-op Pain: none  Post-op Assessment: Post-op Vital signs reviewed, Patient's Cardiovascular Status Stable, Respiratory Function Stable, Patent Airway, No signs of Nausea or vomiting and Pain level controlled  Post-op Vital Signs: Reviewed and stable  Complications: No apparent anesthesia complications

## 2012-03-29 NOTE — Op Note (Signed)
NAME:  Julie Sosa, Julie Sosa              ACCOUNT NO.:  [id number]  MEDICAL RECORD NO.:  [id number]  LOCATION:  [facility]                          FACILITY:  [facility]  PHYSICIAN:  Susanne Greenhouse, MD       DATE OF BIRTH:  [birth year]  DATE OF PROCEDURE:  03/29/2012 DATE OF DISCHARGE:  03/29/2012                              OPERATIVE REPORT   PREOPERATIVE DIAGNOSIS:  Nuclear cataract, left eye, diagnosis code 366.16.  POSTOPERATIVE DIAGNOSIS:  Nuclear cataract, left eye, diagnosis code 366.16.  OPERATION PERFORMED:  Phacoemulsification with posterior chamber intraocular lens implantation, left eye.  SURGEON:  Bonne Dolores. Antoneo Ghrist, MD.  ANESTHESIA:  Topical with monitored anesthesia care and IV sedation.  OPERATIVE SUMMARY:  In the preoperative area, dilating drops were placed into the left eye.  The patient was then brought into the operating room where she was placed under topical anesthesia and IV sedation.  The eye was then prepped and draped.  Beginning with a 75 blade, a paracentesis port was made at the surgeon's 2 o'clock position.  The anterior chamber was then filled with a 1% nonpreserved lidocaine solution with epinephrine.  This was followed by Viscoat to deepen the chamber.  A small fornix-based peritomy was performed superiorly.  Next, a single iris hook was placed through the limbus superiorly.  A 2.4-mm keratome blade was then used to make a clear corneal incision over the iris hook. A bent cystotome needle and Utrata forceps were used to create a continuous tear capsulotomy.  Hydrodissection was performed using balanced salt solution on a fine cannula.  The lens nucleus was then removed using phacoemulsification in a quadrant cracking technique.  The cortical material was then removed with irrigation and aspiration.  The capsular bag and anterior chamber were refilled with Provisc.  The wound was widened to approximately 3 mm and a posterior chamber intraocular lens was placed into the  capsular bag without difficulty using an [employer] lens injecting system.  A single 10-0 nylon suture was then used to close the incision as well as stromal hydration.  The Provisc was removed from the anterior chamber and capsular bag with irrigation and aspiration.  At this point, the wounds were tested for leak, which were negative.  The anterior chamber remained deep and stable.  The patient tolerated the procedure well.  There were no operative complications, and she awoke from topical anesthesia and IV sedation without problem.  No surgical specimens.  Prosthetic device used is a Theme park manager, model [employer], model number MX60 power of 23.5, serial number is [id number].          ______________________________ Susanne Greenhouse, MD     KEH/MEDQ  D:  03/29/2012  T:  03/29/2012  Job:  [id number]

## 2012-03-29 NOTE — Transfer of Care (Signed)
Immediate Anesthesia Transfer of Care Note  Patient: Julie Sosa  Procedure(s) Performed: Procedure(s) (LRB): CATARACT EXTRACTION PHACO AND INTRAOCULAR LENS PLACEMENT (IOC) (Left)  Patient Location: Shortstay  Anesthesia Type: MAC  Level of Consciousness: awake  Airway & Oxygen Therapy: Patient Spontanous Breathing   Post-op Assessment: Report given to PACU RN, Post -op Vital signs reviewed and stable and Patient moving all extremities  Post vital signs: Reviewed and stable  Complications: No apparent anesthesia complications

## 2012-04-02 ENCOUNTER — Encounter (HOSPITAL_COMMUNITY): Payer: Self-pay | Admitting: Ophthalmology

## 2012-12-31 ENCOUNTER — Other Ambulatory Visit (HOSPITAL_COMMUNITY): Payer: Self-pay | Admitting: Internal Medicine

## 2012-12-31 DIAGNOSIS — Z139 Encounter for screening, unspecified: Secondary | ICD-10-CM

## 2013-01-20 ENCOUNTER — Ambulatory Visit (HOSPITAL_COMMUNITY)
Admission: RE | Admit: 2013-01-20 | Discharge: 2013-01-20 | Disposition: A | Discharge status: Home / Self Care | Payer: Medicare Other | Source: Ambulatory Visit | Attending: Internal Medicine | Admitting: Internal Medicine

## 2013-01-20 DIAGNOSIS — Z139 Encounter for screening, unspecified: Secondary | ICD-10-CM

## 2013-01-20 DIAGNOSIS — Z1231 Encounter for screening mammogram for malignant neoplasm of breast: Secondary | ICD-10-CM | POA: Insufficient documentation

## 2014-01-09 ENCOUNTER — Other Ambulatory Visit (HOSPITAL_COMMUNITY): Payer: Self-pay | Admitting: Internal Medicine

## 2014-01-09 DIAGNOSIS — Z139 Encounter for screening, unspecified: Secondary | ICD-10-CM

## 2014-01-23 ENCOUNTER — Ambulatory Visit (HOSPITAL_COMMUNITY): Payer: Medicare Other

## 2014-01-26 ENCOUNTER — Ambulatory Visit (HOSPITAL_COMMUNITY)
Admission: RE | Admit: 2014-01-26 | Discharge: 2014-01-26 | Disposition: A | Discharge status: Home / Self Care | Payer: Medicare HMO | Source: Ambulatory Visit | Attending: Internal Medicine | Admitting: Internal Medicine

## 2014-01-26 DIAGNOSIS — Z139 Encounter for screening, unspecified: Secondary | ICD-10-CM

## 2014-01-26 DIAGNOSIS — Z1231 Encounter for screening mammogram for malignant neoplasm of breast: Secondary | ICD-10-CM | POA: Diagnosis present

## 2014-09-11 ENCOUNTER — Other Ambulatory Visit (HOSPITAL_COMMUNITY): Payer: Self-pay | Admitting: Internal Medicine

## 2014-09-11 ENCOUNTER — Ambulatory Visit (HOSPITAL_COMMUNITY)
Admission: RE | Admit: 2014-09-11 | Discharge: 2014-09-11 | Disposition: A | Discharge status: Home / Self Care | Payer: Medicare HMO | Source: Ambulatory Visit | Attending: Internal Medicine | Admitting: Internal Medicine

## 2014-09-11 DIAGNOSIS — M25552 Pain in left hip: Secondary | ICD-10-CM | POA: Insufficient documentation

## 2014-09-11 DIAGNOSIS — M25562 Pain in left knee: Secondary | ICD-10-CM | POA: Diagnosis not present

## 2014-09-11 DIAGNOSIS — M25551 Pain in right hip: Secondary | ICD-10-CM

## 2014-09-11 DIAGNOSIS — M25561 Pain in right knee: Secondary | ICD-10-CM | POA: Diagnosis present

## 2014-12-13 ENCOUNTER — Encounter
Admission: RE | Admit: 2014-12-13 | Discharge: 2014-12-13 | Disposition: A | Discharge status: Home / Self Care | Payer: Medicare HMO | Source: Ambulatory Visit | Attending: Orthopedic Surgery | Admitting: Orthopedic Surgery

## 2014-12-13 DIAGNOSIS — E785 Hyperlipidemia, unspecified: Secondary | ICD-10-CM | POA: Diagnosis not present

## 2014-12-13 DIAGNOSIS — M79605 Pain in left leg: Secondary | ICD-10-CM | POA: Insufficient documentation

## 2014-12-13 DIAGNOSIS — M1612 Unilateral primary osteoarthritis, left hip: Secondary | ICD-10-CM | POA: Diagnosis not present

## 2014-12-13 DIAGNOSIS — I10 Essential (primary) hypertension: Secondary | ICD-10-CM | POA: Insufficient documentation

## 2014-12-13 DIAGNOSIS — Z01812 Encounter for preprocedural laboratory examination: Secondary | ICD-10-CM | POA: Diagnosis not present

## 2014-12-13 HISTORY — DX: Gastro-esophageal reflux disease without esophagitis: K21.9

## 2014-12-13 HISTORY — DX: Malignant (primary) neoplasm, unspecified: C80.1

## 2014-12-13 LAB — URINALYSIS COMPLETE WITH MICROSCOPIC (ARMC ONLY)
BACTERIA UA: NONE SEEN
BILIRUBIN URINE: NEGATIVE
GLUCOSE, UA: NEGATIVE mg/dL
Ketones, ur: NEGATIVE mg/dL
NITRITE: NEGATIVE
PH: 5 (ref 5.0–8.0)
Protein, ur: NEGATIVE mg/dL
SPECIFIC GRAVITY, URINE: 1.016 (ref 1.005–1.030)

## 2014-12-13 LAB — BASIC METABOLIC PANEL
ANION GAP: 8 (ref 5–15)
BUN: 13 mg/dL (ref 6–20)
CALCIUM: 9.2 mg/dL (ref 8.9–10.3)
CO2: 26 mmol/L (ref 22–32)
CREATININE: 1.03 mg/dL — AB (ref 0.44–1.00)
Chloride: 106 mmol/L (ref 101–111)
GFR, EST AFRICAN AMERICAN: 60 mL/min — AB (ref >60)
GFR, EST NON AFRICAN AMERICAN: 51 mL/min — AB (ref >60)
Glucose, Bld: 103 mg/dL — ABNORMAL HIGH (ref 65–99)
Potassium: 3.8 mmol/L (ref 3.5–5.1)
SODIUM: 140 mmol/L (ref 135–145)

## 2014-12-13 LAB — CBC
HEMATOCRIT: 37.3 % (ref 35.0–47.0)
Hemoglobin: 12.5 g/dL (ref 12.0–16.0)
MCH: 29.3 pg (ref 26.0–34.0)
MCHC: 33.5 g/dL (ref 32.0–36.0)
MCV: 87.4 fL (ref 80.0–100.0)
PLATELETS: 239 10*3/uL (ref 150–440)
RBC: 4.27 MIL/uL (ref 3.80–5.20)
RDW: 13.2 % (ref 11.5–14.5)
WBC: 6.6 10*3/uL (ref 3.6–11.0)

## 2014-12-13 LAB — SEDIMENTATION RATE: Sed Rate: 17 mm/hr (ref 0–30)

## 2014-12-13 LAB — PROTIME-INR
INR: 0.99
Prothrombin Time: 13.3 seconds (ref 11.4–15.0)

## 2014-12-13 LAB — TYPE AND SCREEN
ABO/RH(D): A POS
Antibody Screen: NEGATIVE

## 2014-12-13 LAB — APTT: aPTT: 28 seconds (ref 24–36)

## 2014-12-13 LAB — ABO/RH: ABO/RH(D): A POS

## 2014-12-13 LAB — SURGICAL PCR SCREEN
MRSA, PCR: NEGATIVE
STAPHYLOCOCCUS AUREUS: NEGATIVE

## 2014-12-13 NOTE — Patient Instructions (Signed)
  Your procedure is scheduled on: December 25, 2014 (Monday) Report to Day Surgery. [name]) Second Floor To find out your arrival time please call [phone] between 1PM - 3PM on December 22, 2014 (Friday).  Remember: Instructions that are not followed completely may result in serious medical risk, up to and including death, or upon the discretion of your surgeon and anesthesiologist your surgery may need to be rescheduled.    __x__ 1. Do not eat food or drink liquids after midnight. No gum chewing or hard candies.     ____ 2. No Alcohol for 24 hours before or after surgery.   ____ 3. Bring all medications with you on the day of surgery if instructed.    __x__ 4. Notify your doctor if there is any change in your medical condition     (cold, fever, infections).     Do not wear jewelry, make-up, hairpins, clips or nail polish.  Do not wear lotions, powders, or perfumes. You may wear deodorant.  Do not shave 48 hours prior to surgery. Men may shave face and neck.  Do not bring valuables to the hospital.    [facility] is not responsible for any belongings or valuables.               Contacts, dentures or bridgework may not be worn into surgery.  Leave your suitcase in the car. After surgery it may be brought to your room.  For patients admitted to the hospital, discharge time is determined by your                treatment team.   Patients discharged the day of surgery will not be allowed to drive home.   Please read over the following fact sheets that you were given:   MRSA Information and Surgical Site Infection Prevention   ____ Take these medicines the morning of surgery with A SIP OF WATER:    1. Atenolol  2. Simvastatin  3.   4.  5.  6.  ____ Fleet Enema (as directed)   _x___ Use CHG Soap as directed  ____ Use inhalers on the day of surgery  ____ Stop metformin 2 days prior to surgery    ____ Take 1/2 of usual insulin dose the night before surgery and  none on the morning of surgery.   ____ Stop Coumadin/Plavix/aspirin on   ____ Stop Anti-inflammatories on    ____ Stop supplements until after surgery.    ____ Bring C-Pap to the hospital.

## 2014-12-16 LAB — URINE CULTURE

## 2014-12-18 NOTE — Pre-Procedure Instructions (Signed)
Faxed and called Dr. Marry Guan office regarding urine culture results , spoke to [place].

## 2014-12-25 ENCOUNTER — Inpatient Hospital Stay: Payer: Medicare HMO | Admitting: Anesthesiology

## 2014-12-25 ENCOUNTER — Encounter: Payer: Self-pay | Admitting: *Deleted

## 2014-12-25 ENCOUNTER — Inpatient Hospital Stay
Admission: RE | Admit: 2014-12-25 | Discharge: 2014-12-28 | DRG: 470 | Disposition: A | Discharge status: Home Health | Payer: Medicare HMO | Source: Ambulatory Visit | Attending: Orthopedic Surgery | Admitting: Orthopedic Surgery

## 2014-12-25 ENCOUNTER — Inpatient Hospital Stay: Payer: Medicare HMO

## 2014-12-25 ENCOUNTER — Encounter
Admission: RE | Disposition: A | Discharge status: Home Health | Payer: Self-pay | Source: Ambulatory Visit | Attending: Orthopedic Surgery

## 2014-12-25 DIAGNOSIS — F329 Major depressive disorder, single episode, unspecified: Secondary | ICD-10-CM | POA: Diagnosis present

## 2014-12-25 DIAGNOSIS — E785 Hyperlipidemia, unspecified: Secondary | ICD-10-CM | POA: Diagnosis present

## 2014-12-25 DIAGNOSIS — G2581 Restless legs syndrome: Secondary | ICD-10-CM | POA: Diagnosis present

## 2014-12-25 DIAGNOSIS — E780 Pure hypercholesterolemia: Secondary | ICD-10-CM | POA: Diagnosis present

## 2014-12-25 DIAGNOSIS — M169 Osteoarthritis of hip, unspecified: Secondary | ICD-10-CM | POA: Diagnosis present

## 2014-12-25 DIAGNOSIS — M1612 Unilateral primary osteoarthritis, left hip: Secondary | ICD-10-CM | POA: Diagnosis present

## 2014-12-25 DIAGNOSIS — Z85820 Personal history of malignant melanoma of skin: Secondary | ICD-10-CM

## 2014-12-25 DIAGNOSIS — K219 Gastro-esophageal reflux disease without esophagitis: Secondary | ICD-10-CM | POA: Diagnosis present

## 2014-12-25 DIAGNOSIS — I10 Essential (primary) hypertension: Secondary | ICD-10-CM | POA: Diagnosis present

## 2014-12-25 DIAGNOSIS — D62 Acute posthemorrhagic anemia: Secondary | ICD-10-CM | POA: Diagnosis not present

## 2014-12-25 DIAGNOSIS — E876 Hypokalemia: Secondary | ICD-10-CM | POA: Diagnosis not present

## 2014-12-25 DIAGNOSIS — Z96649 Presence of unspecified artificial hip joint: Secondary | ICD-10-CM

## 2014-12-25 DIAGNOSIS — F419 Anxiety disorder, unspecified: Secondary | ICD-10-CM | POA: Diagnosis present

## 2014-12-25 DIAGNOSIS — Z888 Allergy status to other drugs, medicaments and biological substances status: Secondary | ICD-10-CM

## 2014-12-25 HISTORY — PX: TOTAL HIP ARTHROPLASTY: SHX124

## 2014-12-25 SURGERY — ARTHROPLASTY, HIP, TOTAL,POSTERIOR APPROACH
Anesthesia: Spinal | Site: Hip | Laterality: Left | Wound class: Clean

## 2014-12-25 MED ORDER — ACETAMINOPHEN 10 MG/ML IV SOLN
INTRAVENOUS | Status: AC
  Filled 2014-12-25: qty 100

## 2014-12-25 MED ORDER — PROPOFOL 10 MG/ML IV BOLUS
INTRAVENOUS | Status: DC | PRN
  Administered 2014-12-25: 7.5 mg via INTRAVENOUS

## 2014-12-25 MED ORDER — ONDANSETRON HCL 4 MG PO TABS
4.0000 mg | ORAL_TABLET | Freq: Four times a day (QID) | ORAL | Status: DC | PRN
Start: 2014-12-25 — End: 2014-12-28

## 2014-12-25 MED ORDER — ACETAMINOPHEN 650 MG RE SUPP: 650.0000 mg | Freq: Four times a day (QID) | RECTAL | Status: DC | PRN

## 2014-12-25 MED ORDER — PROPOFOL INFUSION 10 MG/ML OPTIME
INTRAVENOUS | Status: DC | PRN
  Administered 2014-12-25: 20 ug/kg/min via INTRAVENOUS

## 2014-12-25 MED ORDER — SODIUM CHLORIDE 0.9 % IV SOLN
10000.0000 ug | INTRAVENOUS | Status: DC | PRN
  Administered 2014-12-25: 20 ug/min via INTRAVENOUS

## 2014-12-25 MED ORDER — FAMOTIDINE 20 MG PO TABS
ORAL_TABLET | ORAL | Status: AC
  Filled 2014-12-25: qty 1

## 2014-12-25 MED ORDER — SIMVASTATIN 20 MG PO TABS
20.0000 mg | ORAL_TABLET | ORAL | Status: DC
  Administered 2014-12-26 – 2014-12-28 (×3): 20 mg via ORAL
  Filled 2014-12-25 (×3): qty 1

## 2014-12-25 MED ORDER — SODIUM CHLORIDE 0.9 % IV SOLN
INTRAVENOUS | Status: DC
  Administered 2014-12-25 – 2014-12-26 (×3): via INTRAVENOUS

## 2014-12-25 MED ORDER — ATENOLOL 25 MG PO TABS
25.0000 mg | ORAL_TABLET | Freq: Every day | ORAL | Status: DC
  Administered 2014-12-26 – 2014-12-28 (×3): 25 mg via ORAL
  Filled 2014-12-25 (×3): qty 1

## 2014-12-25 MED ORDER — INFLUENZA VAC SPLIT QUAD 0.5 ML IM SUSY
0.5000 mL | PREFILLED_SYRINGE | INTRAMUSCULAR | Status: AC
  Administered 2014-12-26: 0.5 mL via INTRAMUSCULAR
  Filled 2014-12-25: qty 0.5

## 2014-12-25 MED ORDER — TRAMADOL HCL 50 MG PO TABS
50.0000 mg | ORAL_TABLET | ORAL | Status: DC | PRN
  Administered 2014-12-25 – 2014-12-26 (×4): 50 mg via ORAL
  Filled 2014-12-25 (×4): qty 1

## 2014-12-25 MED ORDER — ALUM & MAG HYDROXIDE-SIMETH 200-200-20 MG/5ML PO SUSP: 30.0000 mL | ORAL | Status: DC | PRN

## 2014-12-25 MED ORDER — PHENOL 1.4 % MT LIQD: 1.0000 | OROMUCOSAL | Status: DC | PRN

## 2014-12-25 MED ORDER — MORPHINE SULFATE (PF) 2 MG/ML IV SOLN
2.0000 mg | INTRAVENOUS | Status: DC | PRN
  Administered 2014-12-25: 4 mg via INTRAVENOUS
  Administered 2014-12-25 (×2): 2 mg via INTRAVENOUS
  Filled 2014-12-25 (×2): qty 1
  Filled 2014-12-25: qty 2
  Filled 2014-12-25: qty 1

## 2014-12-25 MED ORDER — PROMETHAZINE HCL 25 MG/ML IJ SOLN
12.5000 mg | Freq: Once | INTRAMUSCULAR | Status: AC
  Administered 2014-12-25: 12.5 mg via INTRAVENOUS
  Filled 2014-12-25: qty 1

## 2014-12-25 MED ORDER — OXYCODONE HCL 5 MG PO TABS
5.0000 mg | ORAL_TABLET | ORAL | Status: DC | PRN
  Administered 2014-12-25 – 2014-12-26 (×3): 5 mg via ORAL
  Administered 2014-12-26: 10 mg via ORAL
  Administered 2014-12-26 – 2014-12-28 (×10): 5 mg via ORAL
  Filled 2014-12-25 (×6): qty 1
  Filled 2014-12-25: qty 2
  Filled 2014-12-25 (×7): qty 1

## 2014-12-25 MED ORDER — SENNOSIDES-DOCUSATE SODIUM 8.6-50 MG PO TABS
1.0000 | ORAL_TABLET | Freq: Two times a day (BID) | ORAL | Status: DC
  Administered 2014-12-25 – 2014-12-27 (×6): 1 via ORAL
  Filled 2014-12-25 (×7): qty 1

## 2014-12-25 MED ORDER — GLYCOPYRROLATE 0.2 MG/ML IJ SOLN
INTRAMUSCULAR | Status: DC | PRN
  Administered 2014-12-25: 0.3 mg via INTRAVENOUS

## 2014-12-25 MED ORDER — BISACODYL 10 MG RE SUPP: 10.0000 mg | Freq: Every day | RECTAL | Status: DC | PRN

## 2014-12-25 MED ORDER — POTASSIUM CHLORIDE CRYS ER 20 MEQ PO TBCR
40.0000 meq | EXTENDED_RELEASE_TABLET | ORAL | Status: DC
  Administered 2014-12-25 – 2014-12-28 (×4): 40 meq via ORAL
  Filled 2014-12-25 (×4): qty 2

## 2014-12-25 MED ORDER — SERTRALINE HCL 100 MG PO TABS
100.0000 mg | ORAL_TABLET | Freq: Every day | ORAL | Status: DC
  Administered 2014-12-25 – 2014-12-28 (×4): 100 mg via ORAL
  Filled 2014-12-25 (×4): qty 1

## 2014-12-25 MED ORDER — CLINDAMYCIN PHOSPHATE 600 MG/50ML IV SOLN
600.0000 mg | Freq: Four times a day (QID) | INTRAVENOUS | Status: AC
  Administered 2014-12-25 – 2014-12-26 (×4): 600 mg via INTRAVENOUS
  Filled 2014-12-25 (×4): qty 50

## 2014-12-25 MED ORDER — CLINDAMYCIN PHOSPHATE 600 MG/50ML IV SOLN: 600.0000 mg | Freq: Once | INTRAVENOUS | Status: DC

## 2014-12-25 MED ORDER — ENOXAPARIN SODIUM 30 MG/0.3ML ~~LOC~~ SOLN
30.0000 mg | Freq: Two times a day (BID) | SUBCUTANEOUS | Status: DC
  Administered 2014-12-26 – 2014-12-28 (×5): 30 mg via SUBCUTANEOUS
  Filled 2014-12-25 (×5): qty 0.3

## 2014-12-25 MED ORDER — MEPERIDINE HCL 25 MG/ML IJ SOLN
25.0000 mg | Freq: Once | INTRAMUSCULAR | Status: AC
  Administered 2014-12-25: 25 mg via INTRAVENOUS
  Filled 2014-12-25: qty 1

## 2014-12-25 MED ORDER — CLINDAMYCIN PHOSPHATE 600 MG/50ML IV SOLN
INTRAVENOUS | Status: AC
  Administered 2014-12-25: 600 mg via INTRAVENOUS
  Filled 2014-12-25: qty 50

## 2014-12-25 MED ORDER — ACETAMINOPHEN 325 MG PO TABS: 650.0000 mg | ORAL_TABLET | Freq: Four times a day (QID) | ORAL | Status: DC | PRN

## 2014-12-25 MED ORDER — MENTHOL 3 MG MT LOZG: 1.0000 | LOZENGE | OROMUCOSAL | Status: DC | PRN

## 2014-12-25 MED ORDER — POLYVINYL ALCOHOL 1.4 % OP SOLN
1.0000 [drp] | Freq: Every day | OPHTHALMIC | Status: DC | PRN
Start: 2014-12-25 — End: 2014-12-28

## 2014-12-25 MED ORDER — TRANEXAMIC ACID 1000 MG/10ML IV SOLN
1000.0000 mg | INTRAVENOUS | Status: AC
  Administered 2014-12-25: 1000 mg via INTRAVENOUS
  Filled 2014-12-25: qty 10

## 2014-12-25 MED ORDER — ROPINIROLE HCL 1 MG PO TABS
1.0000 mg | ORAL_TABLET | Freq: Every day | ORAL | Status: DC
  Administered 2014-12-25 – 2014-12-27 (×3): 1 mg via ORAL
  Filled 2014-12-25 (×3): qty 1

## 2014-12-25 MED ORDER — NEOMYCIN-POLYMYXIN B GU 40-200000 IR SOLN
Status: DC | PRN
  Administered 2014-12-25: 2 mL

## 2014-12-25 MED ORDER — ACETAMINOPHEN 10 MG/ML IV SOLN
INTRAVENOUS | Status: DC | PRN
  Administered 2014-12-25: 1000 mg via INTRAVENOUS

## 2014-12-25 MED ORDER — SODIUM CHLORIDE 0.9 % IV SOLN
250.0000 mg | INTRAVENOUS | Status: DC | PRN
  Administered 2014-12-25: 2 ug/kg/min via INTRAVENOUS

## 2014-12-25 MED ORDER — FENTANYL CITRATE (PF) 100 MCG/2ML IJ SOLN
25.0000 ug | INTRAMUSCULAR | Status: DC | PRN
  Administered 2014-12-25 (×2): 25 ug via INTRAVENOUS

## 2014-12-25 MED ORDER — NEOMYCIN-POLYMYXIN B GU 40-200000 IR SOLN
Status: AC
  Filled 2014-12-25: qty 20

## 2014-12-25 MED ORDER — FENTANYL CITRATE (PF) 100 MCG/2ML IJ SOLN
INTRAMUSCULAR | Status: DC | PRN
  Administered 2014-12-25: 100 ug via INTRAVENOUS

## 2014-12-25 MED ORDER — HYDROCHLOROTHIAZIDE 25 MG PO TABS
25.0000 mg | ORAL_TABLET | Freq: Every day | ORAL | Status: DC
  Administered 2014-12-26 – 2014-12-28 (×2): 25 mg via ORAL
  Filled 2014-12-25 (×3): qty 1

## 2014-12-25 MED ORDER — FERROUS SULFATE 325 (65 FE) MG PO TABS
325.0000 mg | ORAL_TABLET | Freq: Two times a day (BID) | ORAL | Status: DC
Start: 2014-12-25 — End: 2014-12-28
  Administered 2014-12-25 – 2014-12-28 (×6): 325 mg via ORAL
  Filled 2014-12-25 (×6): qty 1

## 2014-12-25 MED ORDER — TRANEXAMIC ACID 1000 MG/10ML IV SOLN
1000.0000 mg | Freq: Once | INTRAVENOUS | Status: AC
  Administered 2014-12-25: 1000 mg via INTRAVENOUS
  Filled 2014-12-25: qty 10

## 2014-12-25 MED ORDER — FLEET ENEMA 7-19 GM/118ML RE ENEM: 1.0000 | ENEMA | Freq: Once | RECTAL | Status: DC | PRN

## 2014-12-25 MED ORDER — CLONAZEPAM 0.5 MG PO TABS
0.2500 mg | ORAL_TABLET | Freq: Every day | ORAL | Status: DC
  Administered 2014-12-25 – 2014-12-27 (×3): 0.25 mg via ORAL
  Filled 2014-12-25 (×3): qty 1

## 2014-12-25 MED ORDER — KETAMINE HCL 50 MG/ML IJ SOLN
INTRAMUSCULAR | Status: DC | PRN
  Administered 2014-12-25: .75 mg via INTRAMUSCULAR

## 2014-12-25 MED ORDER — ONDANSETRON HCL 4 MG/2ML IJ SOLN: 4.0000 mg | Freq: Once | INTRAMUSCULAR | Status: DC | PRN

## 2014-12-25 MED ORDER — MAGNESIUM HYDROXIDE 400 MG/5ML PO SUSP
30.0000 mL | Freq: Every day | ORAL | Status: DC | PRN
  Administered 2014-12-26 – 2014-12-27 (×2): 30 mL via ORAL
  Filled 2014-12-25 (×2): qty 30

## 2014-12-25 MED ORDER — METOCLOPRAMIDE HCL 10 MG PO TABS
10.0000 mg | ORAL_TABLET | Freq: Three times a day (TID) | ORAL | Status: AC
  Administered 2014-12-25 – 2014-12-27 (×8): 10 mg via ORAL
  Filled 2014-12-25 (×8): qty 1

## 2014-12-25 MED ORDER — FAMOTIDINE 20 MG PO TABS
20.0000 mg | ORAL_TABLET | Freq: Once | ORAL | Status: AC
  Administered 2014-12-25: 20 mg via ORAL

## 2014-12-25 MED ORDER — DIPHENHYDRAMINE HCL 12.5 MG/5ML PO ELIX: 12.5000 mg | ORAL_SOLUTION | ORAL | Status: DC | PRN

## 2014-12-25 MED ORDER — ONDANSETRON HCL 4 MG/2ML IJ SOLN: 4.0000 mg | Freq: Four times a day (QID) | INTRAMUSCULAR | Status: DC | PRN

## 2014-12-25 MED ORDER — MIDAZOLAM HCL 5 MG/5ML IJ SOLN
INTRAMUSCULAR | Status: DC | PRN
  Administered 2014-12-25: 1 mg via INTRAVENOUS

## 2014-12-25 MED ORDER — FENTANYL CITRATE (PF) 100 MCG/2ML IJ SOLN
INTRAMUSCULAR | Status: AC
  Filled 2014-12-25: qty 2

## 2014-12-25 MED ORDER — ACETAMINOPHEN 10 MG/ML IV SOLN
1000.0000 mg | Freq: Four times a day (QID) | INTRAVENOUS | Status: AC
  Administered 2014-12-25 – 2014-12-26 (×3): 1000 mg via INTRAVENOUS
  Filled 2014-12-25 (×5): qty 100

## 2014-12-25 MED ORDER — LACTATED RINGERS IV SOLN
INTRAVENOUS | Status: DC
  Administered 2014-12-25 (×2): via INTRAVENOUS

## 2014-12-25 SURGICAL SUPPLY — 53 items
BLADE DRUM FLTD (BLADE) ×3 IMPLANT
BLADE SAW 1 (BLADE) ×3 IMPLANT
CANISTER SUCT 1200ML W/VALVE (MISCELLANEOUS) ×3 IMPLANT
CANISTER SUCT 3000ML (MISCELLANEOUS) ×6 IMPLANT
CAPT HIP TOTAL 2 ×2 IMPLANT
CARTRIDGE OIL MAESTRO DRILL (MISCELLANEOUS) ×1 IMPLANT
CATH FOL LEG HOLDER (MISCELLANEOUS) ×3 IMPLANT
CATH TRAY METER 16FR LF (MISCELLANEOUS) ×3 IMPLANT
DIFFUSER MAESTRO (MISCELLANEOUS) ×3 IMPLANT
DRAPE INCISE IOBAN 66X60 STRL (DRAPES) ×3 IMPLANT
DRAPE SHEET LG 3/4 BI-LAMINATE (DRAPES) ×3 IMPLANT
DRAPE TABLE BACK 80X90 (DRAPES) ×3 IMPLANT
DRSG DERMACEA 8X12 NADH (GAUZE/BANDAGES/DRESSINGS) ×3 IMPLANT
DRSG OPSITE POSTOP 3X4 (GAUZE/BANDAGES/DRESSINGS) ×3 IMPLANT
DRSG OPSITE POSTOP 4X12 (GAUZE/BANDAGES/DRESSINGS) ×3 IMPLANT
DRSG OPSITE POSTOP 4X14 (GAUZE/BANDAGES/DRESSINGS) ×3 IMPLANT
DRSG TEGADERM 4X4.75 (GAUZE/BANDAGES/DRESSINGS) ×3 IMPLANT
DURAPREP 26ML APPLICATOR (WOUND CARE) ×3 IMPLANT
ELECT BLADE 6.5 EXT (BLADE) ×3 IMPLANT
ELECT CAUTERY BLADE 6.4 (BLADE) ×3 IMPLANT
GLOVE BIOGEL M STRL SZ7.5 (GLOVE) ×3 IMPLANT
GLOVE INDICATOR 8.0 STRL GRN (GLOVE) ×3 IMPLANT
GLOVE SURG 9.0 ORTHO LTXF (GLOVE) ×3 IMPLANT
GLOVE SURG ORTHO 9.0 STRL STRW (GLOVE) ×3 IMPLANT
GOWN STRL REUS W/ TWL LRG LVL3 (GOWN DISPOSABLE) ×1 IMPLANT
GOWN STRL REUS W/ TWL LRG LVL4 (GOWN DISPOSABLE) ×1 IMPLANT
GOWN STRL REUS W/TWL 2XL LVL3 (GOWN DISPOSABLE) ×3 IMPLANT
GOWN STRL REUS W/TWL LRG LVL3 (GOWN DISPOSABLE) ×3
GOWN STRL REUS W/TWL LRG LVL4 (GOWN DISPOSABLE) ×3
HANDPIECE SUCTION TUBG SURGILV (MISCELLANEOUS) ×3 IMPLANT
HEMOVAC 400CC 10FR (MISCELLANEOUS) ×3 IMPLANT
HOOD PEEL AWAY FACE SHEILD DIS (HOOD) ×6 IMPLANT
KIT RM TURNOVER STRD PROC AR (KITS) ×3 IMPLANT
NDL SAFETY 18GX1.5 (NEEDLE) ×3 IMPLANT
NS IRRIG 1000ML POUR BTL (IV SOLUTION) ×3 IMPLANT
OIL CARTRIDGE MAESTRO DRILL (MISCELLANEOUS) ×3
PACK HIP PROSTHESIS (MISCELLANEOUS) ×3 IMPLANT
SOL .9 NS 3000ML IRR  AL (IV SOLUTION) ×2
SOL .9 NS 3000ML IRR AL (IV SOLUTION) ×1
SOL .9 NS 3000ML IRR UROMATIC (IV SOLUTION) ×1 IMPLANT
SOL PREP PVP 2OZ (MISCELLANEOUS) ×3
SOLUTION PREP PVP 2OZ (MISCELLANEOUS) ×1 IMPLANT
SPONGE DRAIN TRACH 4X4 STRL 2S (GAUZE/BANDAGES/DRESSINGS) ×3 IMPLANT
STAPLER SKIN PROX 35W (STAPLE) ×3 IMPLANT
SUT ETHIBOND #5 BRAIDED 30INL (SUTURE) ×3 IMPLANT
SUT VIC AB 0 CT1 36 (SUTURE) ×3 IMPLANT
SUT VIC AB 1 CT1 36 (SUTURE) ×6 IMPLANT
SUT VIC AB 2-0 CT1 27 (SUTURE) ×3
SUT VIC AB 2-0 CT1 TAPERPNT 27 (SUTURE) ×1 IMPLANT
SYR 20CC LL (SYRINGE) ×3 IMPLANT
TAPE ADH 3 LX (MISCELLANEOUS) ×3 IMPLANT
TAPE TRANSPORE STRL 2 31045 (GAUZE/BANDAGES/DRESSINGS) ×3 IMPLANT
WATER STERILE IRR 1000ML POUR (IV SOLUTION) ×2 IMPLANT

## 2014-12-25 NOTE — Anesthesia Procedure Notes (Signed)
Spinal Patient location during procedure: OR Start time: 12/25/2014 7:13 AM End time: 12/25/2014 7:23 AM Staffing Performed by: anesthesiologist  Preanesthetic Checklist Completed: patient identified, site marked, surgical consent, pre-op evaluation, timeout performed, IV checked, risks and benefits discussed and monitors and equipment checked Spinal Block Patient position: sitting Prep: Betadine Patient monitoring: heart rate, continuous pulse ox, blood pressure and cardiac monitor Approach: midline Location: L4-5 Injection technique: single-shot Needle Needle type: Whitacre and Introducer  Needle gauge: 25 G Needle length: 9 cm Needle insertion depth: 6 cm Additional Notes Negative paresthesia. Negative blood return. Positive free-flowing CSF. Expiration date of kit checked and confirmed. Patient tolerated procedure well, without complications.

## 2014-12-25 NOTE — H&P (Signed)
The patient has been re-examined, and the chart reviewed, and there have been no interval changes to the documented history and physical.    The risks, benefits, and alternatives have been discussed at length. The patient expressed understanding of the risks benefits and agreed with plans for surgical intervention.  James P. Hooten, Jr. M.D.    

## 2014-12-25 NOTE — Progress Notes (Signed)
To OR with thermal cap in place Cleocin, TXA, and sacral dressing sent to OR with patient

## 2014-12-25 NOTE — Progress Notes (Signed)
Dr Marry Guan informed of pt pain level orders received

## 2014-12-25 NOTE — Transfer of Care (Signed)
Immediate Anesthesia Transfer of Care Note  Patient: Julie Sosa  Procedure(s) Performed: Procedure(s): TOTAL HIP ARTHROPLASTY (Left)  Patient Location: PACU  Anesthesia Type:Spinal  Level of Consciousness: awake, alert , oriented and patient cooperative  Airway & Oxygen Therapy: Patient Spontanous Breathing and Patient connected to nasal cannula oxygen  Post-op Assessment: Report given to RN and Post -op Vital signs reviewed and stable  Post vital signs: Reviewed and stable  Last Vitals:  Filed Vitals:   12/25/14 1033  BP: 97/57  Pulse:   Temp: 37 C  Resp:     Complications: No apparent anesthesia complications

## 2014-12-25 NOTE — Care Management (Signed)
List of home health care agencies shared with patient's husband along with my contact card. Patient received from PACU; uncomfortable at this time. I will follow up tomorrow to discuss discharge planning. RN at bedside with patient.

## 2014-12-25 NOTE — Anesthesia Preprocedure Evaluation (Signed)
Anesthesia Evaluation  Patient identified by MRN, date of birth, ID band Patient awake    Reviewed: Allergy & Precautions, NPO status , Patient's Chart, lab work & pertinent test results  History of Anesthesia Complications Negative for: history of anesthetic complications  Airway Mallampati: III  TM Distance: >3 FB     Dental  (+) Teeth Intact   Pulmonary           Cardiovascular hypertension, Pt. on medications and Pt. on home beta blockers      Neuro/Psych Anxiety Depression    GI/Hepatic GERD (occassional)  ,  Endo/Other    Renal/GU      Musculoskeletal  (+) Arthritis , Osteoarthritis,    Abdominal   Peds  Hematology   Anesthesia Other Findings   Reproductive/Obstetrics                             Anesthesia Physical Anesthesia Plan  ASA: II  Anesthesia Plan: Spinal   Post-op Pain Management:    Induction: Intravenous  Airway Management Planned: Nasal Cannula  Additional Equipment:   Intra-op Plan:   Post-operative Plan:   Informed Consent: I have reviewed the patients History and Physical, chart, labs and discussed the procedure including the risks, benefits and alternatives for the proposed anesthesia with the patient or authorized representative who has indicated his/her understanding and acceptance.     Plan Discussed with:   Anesthesia Plan Comments:         Anesthesia Quick Evaluation

## 2014-12-25 NOTE — Progress Notes (Signed)
Pt. C/o pain 8 out of 10. Morphine IV given. Pain down to 3 out of 10. IS at the bedside and pt. Encouraged to use it every time she wakes up tonight. Foley patent. Pt. Resting quietly.

## 2014-12-25 NOTE — Op Note (Signed)
OPERATIVE NOTE  DATE OF SURGERY:  12/25/2014  PATIENT NAME:  Julie Sosa   DOB: 11-May-1935  MRN: [id number]  PRE-OPERATIVE DIAGNOSIS: Degenerative arthrosis of the left hip, primary  POST-OPERATIVE DIAGNOSIS:  Same  PROCEDURE:  Left total hip arthroplasty  SURGEON:  Marciano Sequin. M.D.  ASSISTANT:  Vance Peper, PA (present and scrubbed throughout the case, critical for assistance with exposure, retraction, instrumentation, and closure)  ANESTHESIA: spinal  ESTIMATED BLOOD LOSS: 250 mL  FLUIDS REPLACED: 1900 mL of crystalloid  DRAINS: 2 medium drains to a Hemovac reservoir  IMPLANTS UTILIZED: DePuy 12 mm small stature AML femoral stem, 50 mm OD Pinnacle 100 acetabular component, +4 mm neutral Pinnacle Marathon polyethylene insert, and a 32 mm CoCr +1 mm hip ball  INDICATIONS FOR SURGERY: Julie Sosa is a 79 y.o. year old female with a long history of progressive hip and groin  pain. X-rays demonstrated severe degenerative changes. The patient had not seen any significant improvement despite conservative nonsurgical intervention. After discussion of the risks and benefits of surgical intervention, the patient expressed understanding of the risks benefits and agree with plans for total hip arthroplasty.   The risks, benefits, and alternatives were discussed at length including but not limited to the risks of infection, bleeding, nerve injury, stiffness, blood clots, the need for revision surgery, limb length inequality, dislocation, cardiopulmonary complications, among others, and they were willing to proceed.  PROCEDURE IN DETAIL: The patient was brought into the operating room and, after adequate spinal anesthesia was achieved, the patient was placed in a right lateral decubitus position. Axillary roll was placed and all bony prominences were well-padded. The patient's left hip was cleaned and prepped with alcohol and DuraPrep and draped in the usual sterile fashion. A  "timeout" was performed as per usual protocol. A lateral curvilinear incision was made gently curving towards the posterior superior iliac spine. The IT band was incised in line with the skin incision and the fibers of the gluteus maximus were split in line. The piriformis tendon was identified, skeletonized, and incised at its insertion to the proximal femur and reflected posteriorly. A T type posterior capsulotomy was performed. Prior to dislocation of the femoral head, a threaded Steinmann pin was inserted through a separate stab incision into the pelvis superior to the acetabulum and bent in the form of a stylus so as to assess limb length and hip offset throughout the procedure. The femoral head was then dislocated posteriorly. Inspection of the femoral head demonstrated severe degenerative changes with full-thickness loss of articular cartilage. The femoral neck cut was performed using an oscillating saw. The anterior capsule was elevated off of the femoral neck using a periosteal elevator. Attention was then directed to the acetabulum. The remnant of the labrum was excised using electrocautery. Inspection of the acetabulum also demonstrated significant degenerative changes. The acetabulum was reamed in sequential fashion up to a 49 mm diameter. Good punctate bleeding bone was encountered. A 50 mm Pinnacle 100 acetabular component was positioned and impacted into place. Good scratch fit was appreciated. A +4 mm polyethylene trial was inserted.  Attention was then directed to the proximal femur. A hole for reaming of the proximal femoral canal was created using a high-speed burr. The femoral canal was reamed in sequential fashion up to a 12 mm diameter. This allowed for approximately 7 mm of scratch fit. Serial broaches were inserted up to a 12 mm small stature femoral broach. Calcar region was planed and a trial  reduction was performed using a 32 mm hip ball with a +1 mm neck length. Good equalization of  limb lengths and hip offset was appreciated and excellent stability was noted both anteriorly and posteriorly. Trial components were removed. The acetabular shell was irrigated with copious amounts of normal saline with antibiotic solution and suctioned dry. A +4 mm Pinnacle Marathon polyethylene insert was positioned and impacted into place. Next, a 12 mm small stature AML femoral stem was positioned and impacted into place. Excellent scratch fit was appreciated. A trial reduction was again performed with a 32 mm hip ball with a +1 mm neck length. Again, good equalization of limb lengths was appreciated and excellent stability appreciated both anteriorly and posteriorly. The hip was then dislocated and the trial hip ball was removed. The Morse taper was cleaned and dried. A 32 mm CoCr hip ball with a +1 mm neck length was placed on the trunnion and impacted into place. The hip was then reduced and placed through range of motion. Excellent stability was appreciated both anteriorly and posteriorly.  The wound was irrigated with copious amounts of normal saline with antibiotic solution and suctioned dry. Good hemostasis was appreciated. The posterior capsulotomy was repaired using #5 Ethibond. Piriformis tendon was reapproximated to the undersurface of the gluteus medius tendon using #5 Ethibond. Two medium drains were placed in the wound bed and brought out through separate stab incisions to be attached to a Hemovac reservoir. The IT band was reapproximated using interrupted sutures of #1 Vicryl. Subcutaneous tissue was proximal phalanx using first #0 Vicryl followed by #2-0 Vicryl. The skin was closed with skin staples.  The patient tolerated the procedure well and was transported to the recovery room in stable condition.   Marciano Sequin., M.D.

## 2014-12-25 NOTE — Brief Op Note (Signed)
12/25/2014  10:32 AM  PATIENT:  Julie Sosa  79 y.o. female  PRE-OPERATIVE DIAGNOSIS:  degenerative arthrosis left hip  POST-OPERATIVE DIAGNOSIS:  degenerative arthrosis left hip  PROCEDURE:  Procedure(s): TOTAL HIP ARTHROPLASTY (Left)  SURGEON:  Surgeon(s) and Role:    * Dereck Leep, MD - Primary  ASSISTANTS: Vance Peper, PA    ANESTHESIA:   spinal  EBL:  Total I/O In: 1900 [I.V.:1900] Out: 370 [Urine:120; Blood:250]  BLOOD ADMINISTERED:none  DRAINS: 2 medium hemovac   LOCAL MEDICATIONS USED:  NONE  SPECIMEN:  Source of Specimen:  left femoral head  DISPOSITION OF SPECIMEN:  PATHOLOGY  COUNTS:  YES  TOURNIQUET:  * No tourniquets in log *  DICTATION: .Dragon Dictation  PLAN OF CARE: Admit to inpatient   PATIENT DISPOSITION:  PACU - hemodynamically stable.   Delay start of Pharmacological VTE agent (>24hrs) due to surgical blood loss or risk of bleeding: yes

## 2014-12-25 NOTE — Evaluation (Signed)
Physical Therapy Evaluation Patient Details Name: Julie Sosa MRN: [id number] DOB: July 30, 1935 Today's Date: 12/25/2014   History of Present Illness  Patient is a 79 y/o female that is s/p THR (via posterior approach) on LLE on 12/25/2014.  Clinical Impression  PT discussed case with RN, patient had been in pain prior to this session however upon questioning she states this had more to do with her position in bed as she has become use to having her hips adducted. Patient asked to attempt sitting and possibly ambulation. Patient displayed decreased bed mobility, however provided surprisingly good effort with sit to stand and ambulation attempts with RW. Patient demonstrates decreased gait speed and step length, however no loss of balance noted. All neurologic symptoms had worn off prior to this session. Patient appears on track and motivated to return home at this time, though PT will continue to assess with subsequent sessions. Skilled acute PT services are indicated to address her mobility deficits.     Follow Up Recommendations Home health PT    Equipment Recommendations       Recommendations for Other Services       Precautions / Restrictions Precautions Precautions: Posterior Hip Restrictions Weight Bearing Restrictions: Yes LLE Weight Bearing: Weight bearing as tolerated      Mobility  Bed Mobility Overal bed mobility: Needs Assistance Bed Mobility: Supine to Sit;Sit to Supine     Supine to sit: Mod assist Sit to supine: Min assist   General bed mobility comments: Patient requires vc's for hand rail usage and assistance with transferring LE to and from dangling position. Patient able to use her trunk and UE musculature to bring torso upright.   Transfers Overall transfer level: Needs assistance Equipment used: Rolling walker (2 wheeled) Transfers: Sit to/from Stand Sit to Stand: Min guard         General transfer comment: Patient requires vc's for hand  placement, otherwise patient requires no physical assistance with transfer. No loss of balance noted.   Ambulation/Gait Ambulation/Gait assistance: Min guard Ambulation Distance (Feet): 10 Feet Assistive device: Rolling walker (2 wheeled) Gait Pattern/deviations: Decreased step length - left;Decreased step length - right   Gait velocity interpretation: <1.8 ft/sec, indicative of risk for recurrent falls General Gait Details: Patient with valgus bilaterally though this is chronic per patient. Step length and turnover rate decreased secondary to pain from positioning.   Stairs            Wheelchair Mobility    Modified Rankin (Stroke Patients Only)       Balance Overall balance assessment: Needs assistance Sitting-balance support: Feet unsupported Sitting balance-Leahy Scale: Good Sitting balance - Comments: No balance deficits noted      Standing balance-Leahy Scale: Fair Standing balance comment: Patient is able to turn appropriately with RW and no loss of balance noted, though minimal foot clearance in gait noted.                              Pertinent Vitals/Pain Pain Assessment: 0-10 Pain Score: 7  (Patient stated she wanted to attempt mobiltiy ) Pain Location: Bilateral hips  Pain Descriptors / Indicators: Aching Pain Intervention(s): Limited activity within patient's tolerance;Monitored during session;Premedicated before session;Ice applied    Home Living Family/patient expects to be discharged to:: Private residence Living Arrangements: Spouse/significant other Available Help at Discharge: Family Type of Home: House Home Access: Stairs to enter   [employer] of Steps: 2  Home Equipment: [place] - 2 wheels;Toilet riser;Shower seat;Bedside commode      Prior Function Level of Independence: Independent         Comments: Per patient no falls and independent with no devices prior to this admission.      Hand Dominance         Extremity/Trunk Assessment   Upper Extremity Assessment: Overall WFL for tasks assessed           Lower Extremity Assessment: Overall WFL for tasks assessed (Assessed motor and sensation prior to mobility, both had returned appropriately. )         Communication   Communication: No difficulties  Cognition Arousal/Alertness: Awake/alert Behavior During Therapy: WFL for tasks assessed/performed Overall Cognitive Status: Within Functional Limits for tasks assessed                      General Comments General comments (skin integrity, edema, etc.): Sensory and motor in tact. Drains in place.     Exercises Total Joint Exercises Ankle Circles/Pumps: AROM;10 reps;Both Hip ABduction/ADduction: AROM;Right;10 reps Straight Leg Raises: AROM;10 reps;Right Long Arc Quad: AROM;Both;10 reps      Assessment/Plan    PT Assessment Patient needs continued PT services  PT Diagnosis Difficulty walking;Generalized weakness;Acute pain;Abnormality of gait   PT Problem List Decreased strength;Pain;Decreased knowledge of use of DME;Decreased activity tolerance;Decreased balance;Decreased mobility  PT Treatment Interventions DME instruction;Gait training;Stair training;Therapeutic activities;Balance training;Therapeutic exercise   PT Goals (Current goals can be found in the Care Plan section) Acute Rehab PT Goals Patient Stated Goal: To return home safely  PT Goal Formulation: With patient/family Time For Goal Achievement: 01/08/15 Potential to Achieve Goals: Good    Frequency BID   Barriers to discharge        Co-evaluation               End of Session Equipment Utilized During Treatment: Gait belt Activity Tolerance: Patient tolerated treatment well Patient left: in bed;with call bell/phone within reach;with bed alarm set;with family/visitor present Nurse Communication: Mobility status         Time: 0998-3382 PT Time Calculation (min) (ACUTE ONLY): 27  min   Charges:   PT Evaluation $Initial PT Evaluation Tier I: 1 Procedure PT Treatments $Therapeutic Exercise: 8-22 mins   PT G Codes:       Kerman Passey, PT, DPT    12/25/2014, 5:15 PM

## 2014-12-26 DIAGNOSIS — M1612 Unilateral primary osteoarthritis, left hip: Secondary | ICD-10-CM | POA: Diagnosis not present

## 2014-12-26 LAB — BASIC METABOLIC PANEL
ANION GAP: 7 (ref 5–15)
BUN: 12 mg/dL (ref 6–20)
CHLORIDE: 102 mmol/L (ref 101–111)
CO2: 26 mmol/L (ref 22–32)
Calcium: 7.8 mg/dL — ABNORMAL LOW (ref 8.9–10.3)
Creatinine, Ser: 0.91 mg/dL (ref 0.44–1.00)
GFR calc Af Amer: >60 mL/min (ref >60)
GFR, EST NON AFRICAN AMERICAN: 59 mL/min — AB (ref >60)
GLUCOSE: 137 mg/dL — AB (ref 65–99)
POTASSIUM: 3.4 mmol/L — AB (ref 3.5–5.1)
Sodium: 135 mmol/L (ref 135–145)

## 2014-12-26 LAB — CBC
HCT: 29.5 % — ABNORMAL LOW (ref 35.0–47.0)
HEMOGLOBIN: 9.9 g/dL — AB (ref 12.0–16.0)
MCH: 29.7 pg (ref 26.0–34.0)
MCHC: 33.7 g/dL (ref 32.0–36.0)
MCV: 88.2 fL (ref 80.0–100.0)
Platelets: 165 10*3/uL (ref 150–440)
RBC: 3.34 MIL/uL — AB (ref 3.80–5.20)
RDW: 13.1 % (ref 11.5–14.5)
WBC: 8.9 10*3/uL (ref 3.6–11.0)

## 2014-12-26 NOTE — Progress Notes (Signed)
   Subjective: 1 Day Post-Op Procedure(s) (LRB): TOTAL HIP ARTHROPLASTY (Left) Patient reports pain as mild.   Patient is well, and has had no acute complaints or problems We will start therapy today.  No CP,SOB, N/V, abdominal pain.  Objective: Vital signs in last 24 hours: Temp:  [97.4 F (36.3 C)-98.6 F (37 C)] 98.4 F (36.9 C) (09/20 0744) Pulse Rate:  [54-79] 74 (09/20 0744) Resp:  [10-20] 16 (09/20 0744) BP: (93-149)/(54-83) 93/66 mmHg (09/20 0744) SpO2:  [89 %-100 %] 95 % (09/20 0744)  Intake/Output from previous day: 09/19 0701 - 09/20 0700 In: 4334 [I.V.:3824; IV Piggyback:510] Out: 1990 [UUVOZ:3664; Drains:120; Blood:250] Intake/Output this shift:     Recent Labs  12/26/14 0545  HGB 9.9*    Recent Labs  12/26/14 0545  WBC 8.9  RBC 3.34*  HCT 29.5*  PLT 165    Recent Labs  12/26/14 0545  NA 135  K 3.4*  CL 102  CO2 26  BUN 12  CREATININE 0.91  GLUCOSE 137*  CALCIUM 7.8*   No results for input(s): LABPT, INR in the last 72 hours.  EXAM General - Patient is Alert, Appropriate and Oriented Extremity - Neurologically intact Neurovascular intact Sensation intact distally Intact pulses distally Dorsiflexion/Plantar flexion intact Dressing - dressing C/D/I, no drainage and drain in place Motor Function - intact, moving foot and toes well on exam.   Past Medical History  Diagnosis Date  . Hypertension   . Anxiety   . Depression   . Hypercholesteremia   . Restless leg syndrome   . GERD (gastroesophageal reflux disease)   . Cancer     melanoma right arm  . Arthritis     osteoarthritis    Assessment/Plan:   1 Day Post-Op Procedure(s) (LRB): TOTAL HIP ARTHROPLASTY (Left) Active Problems:   Degenerative joint disease (DJD) of hip   S/P total hip arthroplasty   Acute post op blood loss anemia    Hypokalemia  Estimated body mass index is 31.76 kg/(m^2) as calculated from the following:   Height as of this encounter: 5\' 1"  (1.549  m).   Weight as of this encounter: 76.204 kg (168 lb). Advance diet Up with therapy  Needs BM Recheck labs in the am Continue with po potassium   DVT Prophylaxis - Lovenox, Foot Pumps and TED hose Weight-Bearing as tolerated to left leg D/C O2 and Pulse OX and try on Room Air  T. Rachelle Hora, PA-C [place] 12/26/2014, 7:50 AM

## 2014-12-26 NOTE — Progress Notes (Signed)
FOLEY D/C'D AT 0600

## 2014-12-26 NOTE — Care Management Note (Addendum)
Case Management Note  Patient Details  Name: Julie Sosa MRN: [id number] Date of Birth: 1936/01/22  Subjective/Objective:                  Met with patient to discuss discharge planning. She states she feels much better today with pain control. She plans to return home with her husband at discharge; PT recommending HHPT. She would like to use [facility] which is in-network with her [employer]. She states she has front-wheeled walker and bedside commode available to use at home. She uses [facility] [phone] for Rx.   Action/Plan: List of home health care agencies left with patient. Referral made to [facility] with [place]. [facility] RNCM will continue to follow.   Expected Discharge Date:  12/29/14               Expected Discharge Plan:     In-House Referral:     Discharge planning Services     Post Acute Care Choice:  Home Health Choice offered to:  Patient, Sibling  DME Arranged:  N/A DME Agency:     HH Arranged:  [place] Status of Service:  In process, will continue to follow  Medicare Important Message Given:    Date Medicare IM Given:    Medicare IM give by:    Date Additional Medicare IM Given:    Additional Medicare Important Message give by:     If discussed at [place] Stay Meetings, dates discussed:    Additional Comments: [facility] has accepted this patient for HHPT. Lovenox $192.00. Patient aware and agrees.   AMarshell Garfinkel RN 12/26/2014, 10:28 AM

## 2014-12-26 NOTE — Progress Notes (Signed)
Pt. Awake with c/o pain in left hip 4 out of 10. Oxy 5mg  administered. Neurochecks WDL. IS encouraged.

## 2014-12-26 NOTE — Anesthesia Postprocedure Evaluation (Signed)
  Anesthesia Post-op Note  Patient: Julie Sosa  Procedure(s) Performed: Procedure(s): TOTAL HIP ARTHROPLASTY (Left)  Anesthesia type:Spinal  Patient location: 156  Post pain: Pain level controlled  Post assessment: Post-op Vital signs reviewed, Patient's Cardiovascular Status Stable, Respiratory Function Stable, Patent Airway and No signs of Nausea or vomiting  Post vital signs: Reviewed and stable  Last Vitals:  Filed Vitals:   12/26/14 0450  BP: 110/65  Pulse: 77  Temp: 36.8 C  Resp: 18    Level of consciousness: awake, alert  and patient cooperative  Complications: No apparent anesthesia complications

## 2014-12-26 NOTE — Evaluation (Signed)
Occupational Therapy Evaluation Patient Details Name: Julie Sosa MRN: [id number] DOB: December 02, 1935 Today's Date: 12/26/2014    History of Present Illness This patient is a 79 year old female who came to [facility] for a L THR (posterior)   Clinical Impression   This patient is a 79 year old female who came to [facility] for a left total hip replacement (posterior approach).  Patient lives in a one story home with a basement with her husband.  She had been independent with ADL and functional mobility.  She now requires some assistance and would benefit from Occupational Therapy for activities of daily living functional mobility training while staying within hip precautions (posterior approach)      Follow Up Recommendations       Equipment Recommendations    Hip kit   Recommendations for Other Services       Precautions / Restrictions Precautions Precautions: Posterior Hip Restrictions LLE Weight Bearing: Weight bearing as tolerated      Mobility Bed Mobility                  Transfers                      Balance                                            ADL                                         General ADL Comments: Patient had been independent with ADL and functional mobility with out assistive devices. She now requires assist. Patient practiced techniques for Donned/doffed socks and pants to knees (drain still in place). She required minimal assist and verbal cues and physical cues to stay within hip precautions. Patient used a hip kit and instructed in their use.     Vision     Perception     Praxis      Pertinent Vitals/Pain       Hand Dominance     Extremity/Trunk Assessment Upper Extremity Assessment Upper Extremity Assessment: Overall WFL for tasks assessed   Lower Extremity Assessment Lower Extremity Assessment: Defer to PT evaluation       Communication  Communication Communication: No difficulties   Cognition Arousal/Alertness: Awake/alert Behavior During Therapy: WFL for tasks assessed/performed Overall Cognitive Status: Within Functional Limits for tasks assessed                     General Comments       Exercises       Shoulder Instructions      Home Living Family/patient expects to be discharged to:: Private residence Living Arrangements: Spouse/significant other Available Help at Discharge: Family Type of Home: House Home Access: Stairs to enter Technical brewer of Steps: 2         Bathroom Shower/Tub: Walk-in shower         Home Equipment: Environmental consultant - 2 wheels;Toilet riser;Shower seat;Bedside commode          Prior Functioning/Environment Level of Independence: Independent             OT Diagnosis:     OT Problem List:     OT Treatment/Interventions:  OT Goals(Current goals can be found in the care plan section)    OT Frequency:     Barriers to D/C:            Co-evaluation              End of Session    Activity Tolerance:        Time:  -    Charges:    G-Codes:    Myrene Galas, MS/OTR/L  12/26/2014, 10:25 AM

## 2014-12-26 NOTE — Progress Notes (Addendum)
Physical Therapy Treatment Patient Details Name: Julie Sosa MRN: [id number] DOB: Sep 17, 1935 Today's Date: 12/26/2014    History of Present Illness This patient is a 79 year old female who came to [facility] for a L THR (posterior) (12/24/14), WBAT.    PT Comments    Patient with excellent efforts and progress during therapy session this date.  Reports good pain control (5/10 at most) and demonstrates all mobility with no greater than cga/min assist (including gait x140' with RW).  Patient very pleased/encouraged by progress; eager for continued progress and discharge home.  Will plan to complete gait trial around nursing station this PM.   Anticipate patient appropriate for discharge home next date from therapy stand-point (if cleared medically) with all mobility goals addressed during hospitalization.   Follow Up Recommendations  Home health PT     Equipment Recommendations       Recommendations for Other Services       Precautions / Restrictions Precautions Precautions: Posterior Hip Restrictions Weight Bearing Restrictions: Yes LLE Weight Bearing: Weight bearing as tolerated    Mobility  Bed Mobility Overal bed mobility: Needs Assistance Bed Mobility: Supine to Sit     Supine to sit: Min assist     General bed mobility comments: min assist for L LE management and adherence to posterior THPs; heavy use of bedrails during movement transitions  Transfers Overall transfer level: Needs assistance Equipment used: Rolling walker (2 wheeled) Transfers: Sit to/from Stand Sit to Stand: Min guard         General transfer comment: improved hand placement, fair WBing L LE with movement transition  Ambulation/Gait Ambulation/Gait assistance: Min guard Ambulation Distance (Feet): 140 Feet Assistive device: Rolling walker (2 wheeled)       General Gait Details: reciprocal stepping pattern with fair stance time/weight acceptance to L LE; no buckling or LOB.  Steady  cadence with good adherence to THPs during gait efforts.   Stairs            Wheelchair Mobility    Modified Rankin (Stroke Patients Only)       Balance Overall balance assessment: Needs assistance Sitting-balance support: No upper extremity supported;Feet supported Sitting balance-Leahy Scale: Good     Standing balance support: Bilateral upper extremity supported Standing balance-Leahy Scale: Fair                      Cognition Arousal/Alertness: Awake/alert Behavior During Therapy: WFL for tasks assessed/performed Overall Cognitive Status: Within Functional Limits for tasks assessed                      Exercises Other Exercises Other Exercises: Seated LE therex, 1x20, AROM for muscular strength/endurance: ankle pumps, LAQs, marching (within THPs), hip abduct/adduct.  Good LE strength/control noted. Other Exercises: Toilet transfer, SPT with RW, cga; sit/stand from [facility] with RW, cga; standing balance for hygiene and clothing management, cga.  Min cuing for awareness of THPs with toileting activities. Other Exercises: Verbally reviewed THPs--patient able to indep recall 1/3; 3/3 with education.  continues to require cuing for adherence with functional activities.    General Comments        Pertinent Vitals/Pain Pain Assessment: 0-10 Pain Score: 5  Pain Location: L hip Pain Descriptors / Indicators: Aching Pain Intervention(s): Limited activity within patient's tolerance;Monitored during session;Premedicated before session;Repositioned    Home Living Family/patient expects to be discharged to:: Private residence Living Arrangements: Spouse/significant other Available Help at Discharge: Family Type of Home:  House Home Access: Stairs to enter     Home Equipment: Gilford Rile - 2 wheels;Toilet riser;Shower seat;Bedside commode      Prior Function Level of Independence: Independent          PT Goals (current goals can now be found in the care  plan section) Acute Rehab PT Goals Patient Stated Goal: To return home safely  PT Goal Formulation: With patient/family Time For Goal Achievement: 01/08/15 Potential to Achieve Goals: Good Progress towards PT goals: Progressing toward goals    Frequency  BID    PT Plan Current plan remains appropriate    Co-evaluation             End of Session Equipment Utilized During Treatment: Gait belt Activity Tolerance: Patient tolerated treatment well Patient left: with call bell/phone within reach;with family/visitor present;in chair;with chair alarm set     Time: 0086-7619 PT Time Calculation (min) (ACUTE ONLY): 29 min  Charges:  $Gait Training: 8-22 mins $Therapeutic Exercise: 8-22 mins                    G Codes:      Kristen H. Owens Shark, PT, DPT, NCS 12/26/2014, 10:47 AM [phone]

## 2014-12-26 NOTE — Progress Notes (Signed)
POD 1. VSS. Pain controlled with meds per MAR. Foley patent. Neurochecks WDL. IS at the bedside and pt. Instructed to use it on every round. Tolerating PO's. Heels elevated off bed with towel rolls. Resting quietly with husband at the bedside.

## 2014-12-26 NOTE — Progress Notes (Signed)
Physical Therapy Treatment Patient Details Name: Julie Sosa MRN: [id number] DOB: 12-21-1935 Today's Date: 12/26/2014    History of Present Illness This patient is a 79 year old female who came to [facility] for a L THR (posterior) (12/24/14), WBAT.    PT Comments    Patient able to complete gait training around nursing station this date (cga with RW) with good cadence, gait speed, completing 10' walk time in 7 seconds.  Continued improvement in comfort/confidence with all functional activities. Will plan to initiate stair training with RW (has single threshold to enter home) next date; per patient request, will simulate getting in/out of higher bed surface (to simulate home environment).  Follow Up Recommendations  Home health PT     Equipment Recommendations       Recommendations for Other Services       Precautions / Restrictions Precautions Precautions: Fall;Posterior Hip Restrictions Weight Bearing Restrictions: Yes LLE Weight Bearing: Weight bearing as tolerated    Mobility  Bed Mobility Overal bed mobility: Needs Assistance Bed Mobility: Sit to Supine     Supine to sit: Min assist Sit to supine: Min guard   General bed mobility comments: minimal guidance from PT for elevation of L LE over bed surface with sit to supine  Transfers Overall transfer level: Needs assistance Equipment used: Rolling walker (2 wheeled) Transfers: Sit to/from Stand Sit to Stand: Min guard;Supervision         General transfer comment: improved hand placement, fair WBing L LE with movement transition  Ambulation/Gait Ambulation/Gait assistance: Min guard Ambulation Distance (Feet): 240 Feet Assistive device: Rolling walker (2 wheeled)   Gait velocity: 10' walk time, 7 seconds   General Gait Details: progressive increase in L LE step height/length as distance increased.  Mildly antalgic, but with good stance time, control of L LE.  Good cadence, gait speed.   Stairs             Wheelchair Mobility    Modified Rankin (Stroke Patients Only)       Balance Overall balance assessment: Needs assistance Sitting-balance support: No upper extremity supported;Feet supported Sitting balance-Leahy Scale: Good     Standing balance support: Bilateral upper extremity supported Standing balance-Leahy Scale: Fair                      Cognition Arousal/Alertness: Awake/alert Behavior During Therapy: WFL for tasks assessed/performed Overall Cognitive Status: Within Functional Limits for tasks assessed                      Exercises Other Exercises Other Exercises: Issued handout with written/pictorial descriptions of supine LE therex (per THR protocol); encouraged performance x1 outside of therapy this PM.  Patient voiced agreement/understanding. Other Exercises: Toilet transfer, SPT with RW, cga; sit/stand from [facility] with RW, cga; standing balance for hygiene and clothing management, cga.  Min cuing for awareness of THPs with toileting activities.  Hand hygiene at sink, close sup; functional reach at 3-4" outside immediate BOS. Other Exercises: Verbally reviewed technique for car transfer; patient able to indep voice technique without education from therapist.  Did encourage use of standard car vs. trunk (requires stepping up on running board) for optimal safety/indep with car transfers upon discharge.    General Comments        Pertinent Vitals/Pain Pain Assessment: 0-10 Pain Score: 3  Pain Location: L hip Pain Descriptors / Indicators: Aching;Sore Pain Intervention(s): Limited activity within patient's tolerance;Monitored during session;Premedicated before session;Repositioned  Home Living                      Prior Function            PT Goals (current goals can now be found in the care plan section) Acute Rehab PT Goals Patient Stated Goal: To return home safely  PT Goal Formulation: With patient/family Time For Goal  Achievement: 01/08/15 Potential to Achieve Goals: Good Progress towards PT goals: Progressing toward goals    Frequency  BID    PT Plan Current plan remains appropriate    Co-evaluation             End of Session Equipment Utilized During Treatment: Gait belt Activity Tolerance: Patient tolerated treatment well Patient left: with family/visitor present;in bed;with bed alarm set     Time: 8413-2440 PT Time Calculation (min) (ACUTE ONLY): 23 min  Charges:  $Gait Training: 8-22 mins $Therapeutic Exercise: 8-22 mins $Therapeutic Activity: 8-22 mins                    G Codes:      Kristen H. Owens Shark, PT, DPT, NCS 12/26/2014, 2:32 PM [phone]

## 2014-12-26 NOTE — Progress Notes (Signed)
Referred to CSW today for ?SNF. Chart reviewed and patient discussed in unit rounds with  RNCM and RN who indicate patient plans to d/c home with [facility] and DME. CSW to sign off- please contact us if SW needs arise. Eduard Clos, MSW, [place]

## 2014-12-27 LAB — BASIC METABOLIC PANEL
ANION GAP: 4 — AB (ref 5–15)
BUN: 9 mg/dL (ref 6–20)
CALCIUM: 8.2 mg/dL — AB (ref 8.9–10.3)
CO2: 29 mmol/L (ref 22–32)
Chloride: 103 mmol/L (ref 101–111)
Creatinine, Ser: 0.94 mg/dL (ref 0.44–1.00)
GFR, EST NON AFRICAN AMERICAN: 57 mL/min — AB (ref >60)
GLUCOSE: 139 mg/dL — AB (ref 65–99)
POTASSIUM: 3.4 mmol/L — AB (ref 3.5–5.1)
SODIUM: 136 mmol/L (ref 135–145)

## 2014-12-27 LAB — CBC
HCT: 28.5 % — ABNORMAL LOW (ref 35.0–47.0)
Hemoglobin: 9.7 g/dL — ABNORMAL LOW (ref 12.0–16.0)
MCH: 29.7 pg (ref 26.0–34.0)
MCHC: 33.9 g/dL (ref 32.0–36.0)
MCV: 87.5 fL (ref 80.0–100.0)
PLATELETS: 162 10*3/uL (ref 150–440)
RBC: 3.25 MIL/uL — AB (ref 3.80–5.20)
RDW: 13.4 % (ref 11.5–14.5)
WBC: 8 10*3/uL (ref 3.6–11.0)

## 2014-12-27 LAB — SURGICAL PATHOLOGY

## 2014-12-27 NOTE — Progress Notes (Signed)
Physical Therapy Treatment Patient Details Name: Julie Sosa MRN: [id number] DOB: November 13, 1935 Today's Date: 12/27/2014    History of Present Illness This patient is a 79 year old female who came to [facility] for a L THR (posterior) (12/24/14), WBAT.    PT Comments    Able to initiate/complete stair training this AM, completing both curb negotiation and step negotiation (required for getting into elevated bed surface) with RW and cga/close sup.  Good understanding and demonstration of technique and safety awareness. Pain continues to be well-managed at 5/10; no reports of increased pain with WBing or activities. Patient comfortable with pending discharge home; no additional questions/concerns regarding mobility at this time.   Follow Up Recommendations  Home health PT     Equipment Recommendations       Recommendations for Other Services       Precautions / Restrictions Precautions Precautions: Fall;Posterior Hip Restrictions Weight Bearing Restrictions: Yes LLE Weight Bearing: Weight bearing as tolerated    Mobility  Bed Mobility Overal bed mobility: Needs Assistance Bed Mobility: Supine to Sit     Supine to sit: Min assist     General bed mobility comments: assist for truncal elevation; performed towards L side of bed to simulate home environment  Transfers Overall transfer level: Needs assistance Equipment used: Rolling walker (2 wheeled) Transfers: Sit to/from Stand Sit to Stand: Supervision         General transfer comment: good hand placement, good WBing bilat LEs  Ambulation/Gait Ambulation/Gait assistance: Supervision;Min guard Ambulation Distance (Feet): 250 Feet Assistive device: Rolling walker (2 wheeled)       General Gait Details: reciprocal stepping pattern; mild vaulting on L LE.  Good WBing and stance time.  Steady cadence and gait speed without buckling or LOB.   Stairs Stairs: Yes Stairs assistance: Min guard Stair Management: No  rails;With walker Number of Stairs: 1 General stair comments: stair negotiation over 'curb' with RW, cga/close sup, to simulate entry/exit of home; good technique, good safety and stability.   Wheelchair Mobility    Modified Rankin (Stroke Patients Only)       Balance Overall balance assessment: Needs assistance Sitting-balance support: No upper extremity supported;Feet supported Sitting balance-Leahy Scale: Good     Standing balance support: Bilateral upper extremity supported Standing balance-Leahy Scale: Good                      Cognition Arousal/Alertness: Awake/alert Behavior During Therapy: WFL for tasks assessed/performed Overall Cognitive Status: Within Functional Limits for tasks assessed                      Exercises Other Exercises Other Exercises: Reviewed and practiced mobility on/off step to assist with getting in elevated bed surface (encouraged to back up to step, back onto step and pull walker back for support).  Able to complete with cga/close sup.  Good understanding of technique. Other Exercises: Toilet transfer, ambulatory wth RW, sup; sit/stand from [facility] with RW, sup/mod indep; standing balance for hygiene and clothing management, sup/mod indep.     General Comments General comments (skin integrity, edema, etc.): L hip hemovac removed, dressing changed prior to session this date      Pertinent Vitals/Pain Pain Score: 5  Pain Location: L hip Pain Descriptors / Indicators: Aching Pain Intervention(s): Limited activity within patient's tolerance;Monitored during session;Premedicated before session;Repositioned    Home Living  Prior Function            PT Goals (current goals can now be found in the care plan section) Acute Rehab PT Goals Patient Stated Goal: To return home safely  PT Goal Formulation: With patient/family Time For Goal Achievement: 01/08/15 Potential to Achieve Goals: Good Progress  towards PT goals: Progressing toward goals    Frequency  BID    PT Plan Current plan remains appropriate    Co-evaluation             End of Session Equipment Utilized During Treatment: Gait belt Activity Tolerance: Patient tolerated treatment well Patient left: with family/visitor present;in bed;with bed alarm set     Time: 5520-8022 PT Time Calculation (min) (ACUTE ONLY): 23 min  Charges:  $Gait Training: 23-37 mins                    G Codes:      Kristen H. Owens Shark, PT, DPT, NCS 12/27/2014, 11:20 AM [phone]

## 2014-12-27 NOTE — Progress Notes (Signed)
Lovenox teaching done with pt and her husband.

## 2014-12-27 NOTE — Progress Notes (Signed)
Pts BP 102/61, MD notified. Per MD Hooten given atenolol and do not give hydrochlothiazide. Will continue to monitor.

## 2014-12-27 NOTE — Progress Notes (Signed)
   Subjective: 2 Days Post-Op Procedure(s) (LRB): TOTAL HIP ARTHROPLASTY (Left) Patient reports pain as mild.   Patient is well, and has had no acute complaints or problems We will continue physical therapy today.  No CP,SOB, N/V, abdominal pain.  Objective: Vital signs in last 24 hours: Temp:  [98 F (36.7 C)-98.4 F (36.9 C)] 98 F (36.7 C) (09/21 0440) Pulse Rate:  [71-95] 95 (09/21 0440) Resp:  [16-18] 18 (09/21 0440) BP: (93-109)/(60-67) 109/67 mmHg (09/21 0440) SpO2:  [85 %-98 %] 92 % (09/21 0440)  Intake/Output from previous day: 09/20 0701 - 09/21 0700 In: 720 [P.O.:720] Out: 350 [Urine:350] Intake/Output this shift:     Recent Labs  12/26/14 0545 12/27/14 0500  HGB 9.9* 9.7*    Recent Labs  12/26/14 0545 12/27/14 0500  WBC 8.9 8.0  RBC 3.34* 3.25*  HCT 29.5* 28.5*  PLT 165 162    Recent Labs  12/26/14 0545 12/27/14 0500  NA 135 136  K 3.4* 3.4*  CL 102 103  CO2 26 29  BUN 12 9  CREATININE 0.91 0.94  GLUCOSE 137* 139*  CALCIUM 7.8* 8.2*   No results for input(s): LABPT, INR in the last 72 hours.  EXAM General - Patient is Alert, Appropriate and Oriented Extremity - Neurologically intact Neurovascular intact Sensation intact distally Intact pulses distally Dorsiflexion/Plantar flexion intact hemovac removed Dressing - dressing C/D/I and no drainage Motor Function - intact, moving foot and toes well on exam.   Past Medical History  Diagnosis Date  . Hypertension   . Anxiety   . Depression   . Hypercholesteremia   . Restless leg syndrome   . GERD (gastroesophageal reflux disease)   . Cancer     melanoma right arm  . Arthritis     osteoarthritis    Assessment/Plan:   2 Days Post-Op Procedure(s) (LRB): TOTAL HIP ARTHROPLASTY (Left) Active Problems:   Degenerative joint disease (DJD) of hip   S/P total hip arthroplasty   Acute post op blood loss anemia    Hypokalemia  Estimated body mass index is 31.76 kg/(m^2) as  calculated from the following:   Height as of this encounter: 5\' 1"  (1.549 m).   Weight as of this encounter: 76.204 kg (168 lb). Advance diet Up with therapy  Needs BM Recheck labs in the am Continue with po potassium  Plan on discharge to home with HHPT Thursday  DVT Prophylaxis - Lovenox, Foot Pumps and TED hose Weight-Bearing as tolerated to left leg D/C O2 and Pulse OX and try on Room Air  T. Rachelle Hora, PA-C [place] [facility] 12/27/2014, 7:07 AM

## 2014-12-27 NOTE — Care Management Important Message (Signed)
Important Message  Patient Details  Name: Julie Sosa MRN: [id number] Date of Birth: [birth year]   Medicare Important Message Given:  Yes-second notification given    [name] 12/27/2014, 10:08 AM

## 2014-12-27 NOTE — Progress Notes (Signed)
Occupational Therapy Treatment Patient Details Name: Julie Sosa MRN: [id number] DOB: 24-Jun-1935 Today's Date: 12/27/2014    History of present illness This patient is a 79 year old female who came to [facility] for a L THR (posterior) (12/24/14), WBAT.   OT comments  Patient doing very well with functional mobility using rolling walker to go to the bathroom with verbal cues for safe technique and to stay within hip precautions (posterior approach). She demonstrates proper technique for lower body dressing using hip kit with 2 cues.   Follow Up Recommendations       Equipment Recommendations       Recommendations for Other Services      Precautions / Restrictions Precautions Precautions: Fall;Posterior Hip Restrictions Weight Bearing Restrictions: Yes LLE Weight Bearing: Weight bearing as tolerated       Mobility Bed Mobility Overal bed mobility: Needs Assistance Bed Mobility: Sit to Supine;Supine to Sit     Supine to sit: Supervision Sit to supine: Modified independent (Device/Increase time)   General bed mobility comments: cues to stay with in posterior hip precautions.  Transfers Overall transfer level:  (Transfer to raised toilet seat in bathroom) Equipment used: Rolling walker (2 wheeled) Transfers: Sit to/from Stand Sit to Stand: Modified independent (Device/Increase time);Supervision         General transfer comment: cues to push of bed.    Balance Overall balance assessment: Needs assistance Sitting-balance support: Feet supported;No upper extremity supported Sitting balance-Leahy Scale: Good     Standing balance support: Bilateral upper extremity supported Standing balance-Leahy Scale: Good                     ADL                                         General ADL Comments: .Patient got out of bed and walked to the bathroom using rolling walker, supervision,  and verbal cues for safety and technique.  Practiced techniques  for lower body dressing using hip kit with verbal cues and supervision with set up.      Vision                     Perception     Praxis      Cognition   Behavior During Therapy: WFL for tasks assessed/performed Overall Cognitive Status: Within Functional Limits for tasks assessed                       Extremity/Trunk Assessment               Exercises Other Exercises Other Exercises: Standing LE therex, 1x10, AROM with RW, cga/close sup: heel raises, mini squats, marching, hip abduct/adduct.  Good tolerance for partial SLS on L LE; no buckling, no increase in pain reported.   Shoulder Instructions       General Comments      Pertinent Vitals/ Pain       Pain Score: 4  Pain Location: L hip Pain Descriptors / Indicators: Aching Pain Intervention(s): Limited activity within patient's tolerance;Monitored during session;Repositioned  Home Living                                          Prior Functioning/Environment  Frequency       Progress Toward Goals  OT Goals(current goals can now be found in the care plan section)     Acute Rehab OT Goals Patient Stated Goal: To return home safely   Plan      Co-evaluation                 End of Session     Activity Tolerance     Patient Left in bed;with call bell/phone within reach;with bed alarm set;with family/visitor present   Nurse Communication          Time: 1530-1600 OT Time Calculation (min): 30 min  Charges: OT General Charges $OT Visit: 1 Procedure OT Treatments $Self Care/Home Management : 23-37 mins Sharon Mt, MS/OTR/L  Sharon Mt 12/27/2014, 4:27 PM

## 2014-12-27 NOTE — Progress Notes (Signed)
MOM given for constipation.

## 2014-12-27 NOTE — Progress Notes (Signed)
Physical Therapy Treatment Patient Details Name: STACHIA SLUTSKY MRN: [id number] DOB: [birth year] Today's Date: 12/27/2014    History of Present Illness This patient is a 79 year old female who came to [facility] for a L THR (posterior) (12/24/14), WBAT.    PT Comments    Patient continues with excellent progress towards all mobility goals; bed mobility, sit/stand, transfer and stair goal met at this time.  Anticipate achievement of gait goal next date. Patient with questions regarding possible LLD (she reports was pre-existing prior to surgery, appears R LE slightly shorter than L) and measurements of LEs.  Encouraged to discuss with physician and discussed benefit of orthotist referral for evaluation of shoe lift/build-up to R LE as needed.  Patient voiced understanding.   Follow Up Recommendations  Home health PT     Equipment Recommendations       Recommendations for Other Services       Precautions / Restrictions Precautions Precautions: Fall;Posterior Hip Restrictions Weight Bearing Restrictions: Yes LLE Weight Bearing: Weight bearing as tolerated    Mobility  Bed Mobility Overal bed mobility: Needs Assistance Bed Mobility: Sit to Supine;Supine to Sit     Supine to sit: Supervision Sit to supine: Modified independent (Device/Increase time)   General bed mobility comments: encouraged for bridging to scoot towards edge of bed vs. sliding LEs across bed in extended position to decrease stress on L hip joint and improve indep with transition  Transfers Overall transfer level: Needs assistance Equipment used: Rolling walker (2 wheeled) Transfers: Sit to/from Stand Sit to Stand: Supervision;Modified independent (Device/Increase time)            Ambulation/Gait Ambulation/Gait assistance: Supervision Ambulation Distance (Feet): 220 Feet Assistive device: Rolling walker (2 wheeled)       General Gait Details: reciprocal stepping pattern; mild vaulting on L LE  (question some degree of R LE LLD?).  Good WBing and stance time.  Steady cadence and gait speed without buckling or LOB.   Stairs            Wheelchair Mobility    Modified Rankin (Stroke Patients Only)       Balance Overall balance assessment: Needs assistance Sitting-balance support: Feet supported;No upper extremity supported Sitting balance-Leahy Scale: Good     Standing balance support: Bilateral upper extremity supported Standing balance-Leahy Scale: Good                      Cognition                            Exercises Other Exercises Other Exercises: Standing LE therex, 1x10, AROM with RW, cga/close sup: heel raises, mini squats, marching, hip abduct/adduct.  Good tolerance for partial SLS on L LE; no buckling, no increase in pain reported.    General Comments        Pertinent Vitals/Pain Pain Score: 4  Pain Location: L hip Pain Descriptors / Indicators: Aching Pain Intervention(s): Limited activity within patient's tolerance;Monitored during session;Repositioned    Home Living                      Prior Function            PT Goals (current goals can now be found in the care plan section) Acute Rehab PT Goals Patient Stated Goal: To return home safely  PT Goal Formulation: With patient/family Time For Goal Achievement: 01/08/15 Potential to Achieve Goals:  Good Progress towards PT goals: Progressing toward goals    Frequency  BID    PT Plan Current plan remains appropriate    Co-evaluation             End of Session Equipment Utilized During Treatment: Gait belt Activity Tolerance: Patient tolerated treatment well Patient left: with family/visitor present;in bed;with bed alarm set     Time: 7076-1518 PT Time Calculation (min) (ACUTE ONLY): 20 min  Charges:  $Gait Training: 8-22 mins                    G Codes:      Kristen H. Owens Shark, PT, DPT, NCS 12/27/2014, 2:29 PM [phone]

## 2014-12-27 NOTE — Progress Notes (Signed)
POD 2. Alert and oriented. VSS. Up ambulating to BR with walker and stand-by assist. Pain controlled with PO pain meds. Dressing clean dry and intact. Rested quietly throughout the night.

## 2014-12-28 MED ORDER — ENOXAPARIN SODIUM 40 MG/0.4ML ~~LOC~~ SOLN
40.0000 mg | SUBCUTANEOUS | Status: DC
Start: 2014-12-28 — End: 2015-10-08

## 2014-12-28 MED ORDER — OXYCODONE HCL 5 MG PO TABS: 5.0000 mg | ORAL_TABLET | ORAL | Status: DC | PRN

## 2014-12-28 MED ORDER — TRAMADOL HCL 50 MG PO TABS: 50.0000 mg | ORAL_TABLET | ORAL | Status: DC | PRN

## 2014-12-28 NOTE — Progress Notes (Signed)
   Subjective: 3 Days Post-Op Procedure(s) (LRB): TOTAL HIP ARTHROPLASTY (Left) Patient reports pain as mild.   Patient is well, and has had no acute complaints or problems We will continue physical therapy today.  No CP,SOB, N/V, abdominal pain.  Objective: Vital signs in last 24 hours: Temp:  [98 F (36.7 C)-99.1 F (37.3 C)] 98.4 F (36.9 C) (09/22 0738) Pulse Rate:  [76-90] 76 (09/22 0738) Resp:  [17-18] 18 (09/22 0738) BP: (96-137)/(66-95) 111/66 mmHg (09/22 0738) SpO2:  [92 %-97 %] 94 % (09/22 0738)  Intake/Output from previous day: 09/21 0701 - 09/22 0700 In: 720 [P.O.:720] Out: 0  Intake/Output this shift:     Recent Labs  12/26/14 0545 12/27/14 0500  HGB 9.9* 9.7*    Recent Labs  12/26/14 0545 12/27/14 0500  WBC 8.9 8.0  RBC 3.34* 3.25*  HCT 29.5* 28.5*  PLT 165 162    Recent Labs  12/26/14 0545 12/27/14 0500  NA 135 136  K 3.4* 3.4*  CL 102 103  CO2 26 29  BUN 12 9  CREATININE 0.91 0.94  GLUCOSE 137* 139*  CALCIUM 7.8* 8.2*   No results for input(s): LABPT, INR in the last 72 hours.  EXAM General - Patient is Alert, Appropriate and Oriented Extremity - Neurologically intact Neurovascular intact Sensation intact distally Intact pulses distally Dorsiflexion/Plantar flexion intact Dressing - dressing C/D/I and no drainage Motor Function - intact, moving foot and toes well on exam.   Past Medical History  Diagnosis Date  . Hypertension   . Anxiety   . Depression   . Hypercholesteremia   . Restless leg syndrome   . GERD (gastroesophageal reflux disease)   . Cancer     melanoma right arm  . Arthritis     osteoarthritis    Assessment/Plan:   3 Days Post-Op Procedure(s) (LRB): TOTAL HIP ARTHROPLASTY (Left) Active Problems:   Degenerative joint disease (DJD) of hip   S/P total hip arthroplasty   Acute post op blood loss anemia    Hypokalemia  Estimated body mass index is 31.76 kg/(m^2) as calculated from the following:  Height as of this encounter: 5\' 1"  (1.549 m).   Weight as of this encounter: 76.204 kg (168 lb). Advance diet Up with therapy  discharge to home with HHPT Today Follow up with [facility] in 2 weeks  DVT Prophylaxis - Lovenox, Foot Pumps and TED hose Weight-Bearing as tolerated to left leg D/C O2 and Pulse OX and try on Room Air  T. Rachelle Hora, PA-C [place] 12/28/2014, 7:54 AM

## 2014-12-28 NOTE — Discharge Instructions (Signed)
POSTERIOR TOTAL HIP REPLACEMENT POSTOPERATIVE DIRECTIONS  Hip Rehabilitation, Guidelines Following Surgery  The results of a hip operation are greatly improved after range of motion and muscle strengthening exercises. Follow all safety measures which are given to protect your hip. If any of these exercises cause increased pain or swelling in your joint, decrease the amount until you are comfortable again. Then slowly increase the exercises. Call your caregiver if you have problems or questions.   HOME CARE INSTRUCTIONS  Remove items at home which could result in a fall. This includes throw rugs or furniture in walking pathways.   ICE to the affected hip every three hours for 30 minutes at a time and then as needed for pain and swelling.  Continue to use ice on the hip for pain and swelling from surgery. You may notice swelling that will progress down to the foot and ankle.  This is normal after surgery.  Elevate the leg when you are not up walking on it.    Continue to use the breathing machine which will help keep your temperature down.  It is common for your temperature to cycle up and down following surgery, especially at night when you are not up moving around and exerting yourself.  The breathing machine keeps your lungs expanded and your temperature down.  Remove staples and apply steri strips on 02/08/15  DIET You may resume your previous home diet once your are discharged from the hospital.  DRESSING / WOUND CARE / SHOWERING You may start showering once staples have been removed. Change dressing as needed.    ACTIVITY Walk with your walker as instructed. Use walker as long as suggested by your caregivers. Avoid periods of inactivity such as sitting longer than an hour when not asleep. This helps prevent blood clots.  You may resume a sexual relationship in one month or when given the OK by your doctor.  You may return to work once you are cleared by your doctor.  Do not drive  a car for 6 weeks or until released by you surgeon.  Do not drive while taking narcotics.  WEIGHT BEARING Weight bearing as tolerated  POSTOPERATIVE CONSTIPATION PROTOCOL Constipation - defined medically as fewer than three stools per week and severe constipation as less than one stool per week.  One of the most common issues patients have following surgery is constipation.  Even if you have a regular bowel pattern at home, your normal regimen is likely to be disrupted due to multiple reasons following surgery.  Combination of anesthesia, postoperative narcotics, change in appetite and fluid intake all can affect your bowels.  In order to avoid complications following surgery, here are some recommendations in order to help you during your recovery period.  Colace (docusate) - Pick up an over-the-counter form of Colace or another stool softener and take twice a day as long as you are requiring postoperative pain medications.  Take with a full glass of water daily.  If you experience loose stools or diarrhea, hold the colace until you stool forms back up.  If your symptoms do not get better within 1 week or if they get worse, check with your doctor.  Dulcolax (bisacodyl) - Pick up over-the-counter and take as directed by the product packaging as needed to assist with the movement of your bowels.  Take with a full glass of water.  Use this product as needed if not relieved by Colace only.   MiraLax (polyethylene glycol) - Pick up over-the-counter  to have on hand.  MiraLax is a solution that will increase the amount of water in your bowels to assist with bowel movements.  Take as directed and can mix with a glass of water, juice, soda, coffee, or tea.  Take if you go more than two days without a movement. Do not use MiraLax more than once per day. Call your doctor if you are still constipated or irregular after using this medication for 7 days in a row.  If you continue to have problems with  postoperative constipation, please contact the office for further assistance and recommendations.  If you experience "the worst abdominal pain ever" or develop nausea or vomiting, please contact the office immediatly for further recommendations for treatment.  ITCHING  If you experience itching with your medications, try taking only a single pain pill, or even half a pain pill at a time.  You can also use Benadryl over the counter for itching or also to help with sleep.   TED HOSE STOCKINGS Wear the elastic stockings on both legs for six weeks following surgery during the day but you may remove then at night for sleeping.  MEDICATIONS See your medication summary on the After Visit Summary that the nursing staff will review with you prior to discharge.  You may have some home medications which will be placed on hold until you complete the course of blood thinner medication.  It is important for you to complete the blood thinner medication as prescribed by your surgeon.  Continue your approved medications as instructed at time of discharge.  PRECAUTIONS If you experience chest pain or shortness of breath - call 911 immediately for transfer to the hospital emergency department.  If you develop a fever greater that 101 F, purulent drainage from wound, increased redness or drainage from wound, foul odor from the wound/dressing, or calf pain - CONTACT YOUR SURGEON.                                                   FOLLOW-UP APPOINTMENTS Make sure you keep all of your appointments after your operation with your surgeon and caregivers. You should call the office at the above phone number and make an appointment for approximately two weeks after the date of your surgery or on the date instructed by your surgeon outlined in the "After Visit Summary".  RANGE OF MOTION AND STRENGTHENING EXERCISES  These exercises are designed to help you keep full movement of your hip joint. Follow your caregiver's or  physical therapist's instructions. Perform all exercises about fifteen times, three times per day or as directed. Exercise both hips, even if you have had only one joint replacement. These exercises can be done on a training (exercise) mat, on the floor, on a table or on a bed. Use whatever works the best and is most comfortable for you. Use music or television while you are exercising so that the exercises are a pleasant break in your day. This will make your life better with the exercises acting as a break in routine you can look forward to.  Lying on your back, slowly slide your foot toward your buttocks, raising your knee up off the floor. Then slowly slide your foot back down until your leg is straight again.  Lying on your back spread your legs as far apart as you  can without causing discomfort.  Lying on your side, raise your upper leg and foot straight up from the floor as far as is comfortable. Slowly lower the leg and repeat.  Lying on your back, tighten up the muscle in the front of your thigh (quadriceps muscles). You can do this by keeping your leg straight and trying to raise your heel off the floor. This helps strengthen the largest muscle supporting your knee.  Lying on your back, tighten up the muscles of your buttocks both with the legs straight and with the knee bent at a comfortable angle while keeping your heel on the floor.      IF YOU ARE TRANSFERRED TO A SKILLED REHAB FACILITY If the patient is transferred to a skilled rehab facility following release from the hospital, a list of the current medications will be sent to the facility for the patient to continue.  When discharged from the skilled rehab facility, please have the facility set up the patient's [place] prior to being released. Also, the skilled facility will be responsible for providing the patient with their medications at time of release from the facility to include their pain medication, the muscle  relaxants, and their blood thinner medication. If the patient is still at the rehab facility at time of the two week follow up appointment, the skilled rehab facility will also need to assist the patient in arranging follow up appointment in our office and any transportation needs.  MAKE SURE YOU:  Understand these instructions.  Get help right away if you are not doing well or get worse.             Remove staples and apply steri strips on 02/08/15   Pick up stool softner and laxative for home use following surgery while on pain medications. Continue to use ice for pain and swelling after surgery. Do not use any lotions or creams on the incision until instructed by your surgeon. Remove staples and apply steri strips on 02/08/15

## 2014-12-28 NOTE — Progress Notes (Signed)
Physical Therapy Treatment Patient Details Name: Julie Sosa MRN: [id number] DOB: 1935-08-18 Today's Date: 12/28/2014    History of Present Illness This patient is a 79 year old female who came to [facility] for a L THR (posterior) (12/24/14), WBAT.    PT Comments    Patient with excellent mobility progression throughout post-op course.  Currently demonstrating all mobility at sup/mod indep with RW; good adherence to THPs, good gait mechanics.   Patient eager for discharge home; comfortable with current functional level.  No questions/concerns regarding upcoming discharge. All acute PT goals met at this time; patient excellent candidate for progression towards HHPT and outpatient PT.   Follow Up Recommendations  Home health PT     Equipment Recommendations       Recommendations for Other Services       Precautions / Restrictions Precautions Precautions: Fall;Posterior Hip Restrictions Weight Bearing Restrictions: Yes LLE Weight Bearing: Weight bearing as tolerated    Mobility  Bed Mobility               General bed mobility comments: patient up in bathroom upon entry to room  Transfers Overall transfer level: Modified independent Equipment used: Rolling walker (2 wheeled) Transfers: Sit to/from Stand           General transfer comment: good hand placement; tends to maintain L LE slightly anterior to BOS  Ambulation/Gait Ambulation/Gait assistance: Modified independent (Device/Increase time) Ambulation Distance (Feet): 220 Feet         General Gait Details: reciprocal stepping pattern; mild vaulting on L LE (question some degree of R LE LLD?).  Good and symmetrical step height/length.   Good WBing and stance time to L LE.  Steady cadence and gait speed without buckling or LOB.   Stairs            Wheelchair Mobility    Modified Rankin (Stroke Patients Only)       Balance Overall balance assessment: Needs assistance Sitting-balance support:  No upper extremity supported;Feet supported Sitting balance-Leahy Scale: Good     Standing balance support: Bilateral upper extremity supported Standing balance-Leahy Scale: Good                      Cognition Arousal/Alertness: Awake/alert Behavior During Therapy: WFL for tasks assessed/performed Overall Cognitive Status: Within Functional Limits for tasks assessed                      Exercises Other Exercises Other Exercises: Seated LE therex, 1x20, AROM for muscular strength/endurance with functional activities: ankle pumps, LAQs, marching, hip abduct/adduct    General Comments        Pertinent Vitals/Pain Pain Score: 3  Pain Location: L hip, L lateral knee Pain Descriptors / Indicators: Aching;Sore Pain Intervention(s): Limited activity within patient's tolerance;Monitored during session;Repositioned    Home Living                      Prior Function            PT Goals (current goals can now be found in the care plan section) Acute Rehab PT Goals Patient Stated Goal: To return home safely  PT Goal Formulation: With patient/family Time For Goal Achievement: 01/08/15 Potential to Achieve Goals: Good Progress towards PT goals: Goals met and updated - see care plan    Frequency  BID    PT Plan Current plan remains appropriate    Co-evaluation  End of Session Equipment Utilized During Treatment: Gait belt Activity Tolerance: Patient tolerated treatment well Patient left: with family/visitor present;in bed;with bed alarm set     Time: 9518-8416 PT Time Calculation (min) (ACUTE ONLY): 13 min  Charges:  $Gait Training: 8-22 mins                    G Codes:      Julie Sosa 01-05-15, 9:20 AM

## 2014-12-28 NOTE — Discharge Summary (Signed)
Physician Discharge Summary  Patient ID: Julie Sosa MRN: [id number] DOB/AGE: 77-Mar-1937 77 y.o.  Admit date: 12/25/2014 Discharge date: 12/28/2014  Admission Diagnoses:  OA LEFT HIP   Discharge Diagnoses: Patient Active Problem List   Diagnosis Date Noted  . Degenerative joint disease (DJD) of hip 12/25/2014  . S/P total hip arthroplasty 12/25/2014    Past Medical History  Diagnosis Date  . Hypertension   . Anxiety   . Depression   . Hypercholesteremia   . Restless leg syndrome   . GERD (gastroesophageal reflux disease)   . Cancer     melanoma right arm  . Arthritis     osteoarthritis     Transfusion: hemovac reinfusion system   Consultants (if any):    Discharged Condition: Improved  Hospital Course: Julie Sosa is an 79 y.o. female who was admitted 12/25/2014 with a diagnosis of left hip OA and went to the operating room on 12/25/2014 and underwent the above named procedures.    Surgeries: Procedure(s): TOTAL HIP ARTHROPLASTY on 12/25/2014 Patient tolerated the surgery well. Taken to PACU where she was stabilized and then transferred to the orthopedic floor.  Started on Lovenox 30 q 12 hrs. Foot pumps applied bilaterally at 80 mm. Heels elevated on bed with rolled towels. No evidence of DVT. Negative Homan. Physical therapy started on day #1 for gait training and transfer. OT started day #1 for ADL and assisted devices.  Patient's foley was d/c on day #1. Patient's IV and hemovac was d/c on day #2.  On post op day #3 patient was stable and ready for discharge to home with home health PT.  Implants: DePuy 12 mm small stature AML femoral stem, 50 mm OD Pinnacle 100 acetabular component, +4 mm neutral Pinnacle Marathon polyethylene insert, and a 32 mm CoCr +1 mm hip ball   She was given perioperative antibiotics:  Anti-infectives    Start     Dose/Rate Route Frequency Ordered Stop   12/25/14 1200  clindamycin (CLEOCIN) IVPB 600 mg     600 mg 100 mL/hr  over 30 Minutes Intravenous Every 6 hours 12/25/14 1147 12/26/14 0607   12/25/14 0543  clindamycin (CLEOCIN) 600 MG/50ML IVPB    Comments:  Phillips Grout: cabinet override      12/25/14 0543 12/25/14 0754   12/25/14 0500  clindamycin (CLEOCIN) IVPB 600 mg  Status:  Discontinued     600 mg 100 mL/hr over 30 Minutes Intravenous  Once 12/25/14 0452 12/25/14 1145    .  She was given sequential compression devices, early ambulation, and lovenox for DVT prophylaxis.  She benefited maximally from the hospital stay and there were no complications.    Recent vital signs:  Filed Vitals:   12/28/14 0738  BP: 111/66  Pulse: 76  Temp: 98.4 F (36.9 C)  Resp: 18    Recent laboratory studies:  Lab Results  Component Value Date   HGB 9.7* 12/27/2014   HGB 9.9* 12/26/2014   HGB 12.5 12/13/2014   Lab Results  Component Value Date   WBC 8.0 12/27/2014   PLT 162 12/27/2014   Lab Results  Component Value Date   INR 0.99 12/13/2014   Lab Results  Component Value Date   NA 136 12/27/2014   K 3.4* 12/27/2014   CL 103 12/27/2014   CO2 29 12/27/2014   BUN 9 12/27/2014   CREATININE 0.94 12/27/2014   GLUCOSE 139* 12/27/2014    Discharge Medications:     Medication List  TAKE these medications        atenolol 50 MG tablet  Commonly known as:  TENORMIN  Take 25 mg by mouth daily.     clonazePAM 0.5 MG tablet  Commonly known as:  KLONOPIN  Take 0.25 mg by mouth as needed.     enoxaparin 40 MG/0.4ML injection  Commonly known as:  LOVENOX  Inject 0.4 mLs (40 mg total) into the skin daily.     hydrochlorothiazide 25 MG tablet  Commonly known as:  HYDRODIURIL  Take 25 mg by mouth daily.     oxyCODONE 5 MG immediate release tablet  Commonly known as:  Oxy IR/ROXICODONE  Take 1-2 tablets (5-10 mg total) by mouth every 4 (four) hours as needed for severe pain.     potassium chloride SA 20 MEQ tablet  Commonly known as:  K-DUR,KLOR-CON  Take 40 mEq by mouth every morning. 20  mEq every evening     REFRESH 1.4-0.6 % ophthalmic solution  Generic drug:  polyvinyl alcohol-povidone  Place 1-2 drops into both eyes daily as needed. Dry Eyes     rOPINIRole 1 MG tablet  Commonly known as:  REQUIP  Take 1 mg by mouth daily.     sertraline 100 MG tablet  Commonly known as:  ZOLOFT  Take 100 mg by mouth daily.     simvastatin 40 MG tablet  Commonly known as:  ZOCOR  Take 20 mg by mouth every morning.     traMADol 50 MG tablet  Commonly known as:  ULTRAM  Take 1-2 tablets (50-100 mg total) by mouth every 4 (four) hours as needed for moderate pain.        Diagnostic Studies: Dg Hip Port Unilat With Pelvis 1v Left  12/25/2014   CLINICAL DATA:  Left hip DJD.  Status post left hip arthroplasty.  EXAM: DG HIP (WITH OR WITHOUT PELVIS) 1V PORT LEFT  COMPARISON:  None.  FINDINGS: Left hip prosthesis seen in appropriate position. No evidence of fracture or dislocation. Overlying surgical drain and skin staples noted. Mild to moderate right hip osteoarthritis also demonstrated  IMPRESSION: Expected postoperative appearance of left hip prosthesis. No evidence of fracture or dislocation.   Electronically Signed   By: Earle Gell M.D.   On: 12/25/2014 11:17    Disposition: 01-Home or Self Care        Follow-up Information    Follow up with Dereck Leep, MD On 02/06/2015.   Specialty:  Orthopedic Surgery   Why:  at 2:15pm   Contact information:   [place] [id number] [phone]        Signed: Feliberto Gottron 12/28/2014, 8:03 AM

## 2014-12-28 NOTE — Progress Notes (Signed)
POD 3. Pt. Alert and oriented. VSS. Pain controlled with PO pain meds. Up to bathroom with walker and stand-by assist. Pt. Had BM yesterday. For d/c home with home health today. Rested quietly throughout the night.

## 2014-12-28 NOTE — Care Management (Signed)
Patient discharging home today. I have notified Corene Cornea with [facility]. No further RNCM needs. Case closed.

## 2014-12-28 NOTE — Progress Notes (Signed)
Discharge Note:  Pts VSS, discharge instructions given to pt, pt verbalized understanding. Pt wheeled to car.

## 2015-05-15 DIAGNOSIS — Z6831 Body mass index (BMI) 31.0-31.9, adult: Secondary | ICD-10-CM | POA: Diagnosis not present

## 2015-05-15 DIAGNOSIS — M1990 Unspecified osteoarthritis, unspecified site: Secondary | ICD-10-CM | POA: Diagnosis not present

## 2015-05-15 DIAGNOSIS — I10 Essential (primary) hypertension: Secondary | ICD-10-CM | POA: Diagnosis not present

## 2015-06-26 DIAGNOSIS — S80862A Insect bite (nonvenomous), left lower leg, initial encounter: Secondary | ICD-10-CM | POA: Diagnosis not present

## 2015-07-03 ENCOUNTER — Other Ambulatory Visit (HOSPITAL_COMMUNITY): Payer: Self-pay | Admitting: Internal Medicine

## 2015-07-03 DIAGNOSIS — Z1231 Encounter for screening mammogram for malignant neoplasm of breast: Secondary | ICD-10-CM

## 2015-07-04 ENCOUNTER — Ambulatory Visit (HOSPITAL_COMMUNITY)
Admission: RE | Admit: 2015-07-04 | Discharge: 2015-07-04 | Disposition: A | Discharge status: Home / Self Care | Payer: PPO | Source: Ambulatory Visit | Attending: Internal Medicine | Admitting: Internal Medicine

## 2015-07-04 DIAGNOSIS — Z1231 Encounter for screening mammogram for malignant neoplasm of breast: Secondary | ICD-10-CM | POA: Insufficient documentation

## 2015-08-08 DIAGNOSIS — I10 Essential (primary) hypertension: Secondary | ICD-10-CM | POA: Diagnosis not present

## 2015-08-08 DIAGNOSIS — J309 Allergic rhinitis, unspecified: Secondary | ICD-10-CM | POA: Diagnosis not present

## 2015-08-09 ENCOUNTER — Other Ambulatory Visit (HOSPITAL_COMMUNITY): Payer: Self-pay | Admitting: Pulmonary Disease

## 2015-08-09 ENCOUNTER — Ambulatory Visit (HOSPITAL_COMMUNITY)
Admission: RE | Admit: 2015-08-09 | Discharge: 2015-08-09 | Disposition: A | Discharge status: Home / Self Care | Payer: PPO | Source: Ambulatory Visit | Attending: Pulmonary Disease | Admitting: Pulmonary Disease

## 2015-08-09 DIAGNOSIS — R918 Other nonspecific abnormal finding of lung field: Secondary | ICD-10-CM | POA: Insufficient documentation

## 2015-08-09 DIAGNOSIS — R609 Edema, unspecified: Secondary | ICD-10-CM

## 2015-08-09 DIAGNOSIS — R0989 Other specified symptoms and signs involving the circulatory and respiratory systems: Secondary | ICD-10-CM | POA: Diagnosis not present

## 2015-08-09 DIAGNOSIS — R221 Localized swelling, mass and lump, neck: Secondary | ICD-10-CM | POA: Insufficient documentation

## 2015-08-09 DIAGNOSIS — R059 Cough, unspecified: Secondary | ICD-10-CM

## 2015-08-09 DIAGNOSIS — R05 Cough: Secondary | ICD-10-CM | POA: Insufficient documentation

## 2015-08-14 ENCOUNTER — Other Ambulatory Visit (HOSPITAL_COMMUNITY): Payer: Self-pay | Admitting: Pulmonary Disease

## 2015-08-14 ENCOUNTER — Ambulatory Visit (HOSPITAL_COMMUNITY)
Admission: RE | Admit: 2015-08-14 | Discharge: 2015-08-14 | Disposition: A | Discharge status: Home / Self Care | Payer: PPO | Source: Ambulatory Visit | Attending: Pulmonary Disease | Admitting: Pulmonary Disease

## 2015-08-14 DIAGNOSIS — J984 Other disorders of lung: Secondary | ICD-10-CM

## 2015-08-14 DIAGNOSIS — R911 Solitary pulmonary nodule: Secondary | ICD-10-CM | POA: Diagnosis not present

## 2015-08-28 DIAGNOSIS — I10 Essential (primary) hypertension: Secondary | ICD-10-CM | POA: Diagnosis not present

## 2015-08-28 DIAGNOSIS — M1990 Unspecified osteoarthritis, unspecified site: Secondary | ICD-10-CM | POA: Diagnosis not present

## 2015-09-12 DIAGNOSIS — H26493 Other secondary cataract, bilateral: Secondary | ICD-10-CM | POA: Diagnosis not present

## 2015-09-12 DIAGNOSIS — H538 Other visual disturbances: Secondary | ICD-10-CM | POA: Diagnosis not present

## 2015-09-12 DIAGNOSIS — Z961 Presence of intraocular lens: Secondary | ICD-10-CM | POA: Diagnosis not present

## 2015-09-13 DIAGNOSIS — M2391 Unspecified internal derangement of right knee: Secondary | ICD-10-CM | POA: Diagnosis not present

## 2015-09-13 DIAGNOSIS — M25561 Pain in right knee: Secondary | ICD-10-CM | POA: Diagnosis not present

## 2015-10-03 DIAGNOSIS — J309 Allergic rhinitis, unspecified: Secondary | ICD-10-CM | POA: Diagnosis not present

## 2015-10-18 DIAGNOSIS — I10 Essential (primary) hypertension: Secondary | ICD-10-CM | POA: Diagnosis not present

## 2015-10-18 DIAGNOSIS — J309 Allergic rhinitis, unspecified: Secondary | ICD-10-CM | POA: Diagnosis not present

## 2015-10-22 ENCOUNTER — Encounter (HOSPITAL_COMMUNITY)
Admission: RE | Disposition: A | Discharge status: Home / Self Care | Payer: Self-pay | Source: Ambulatory Visit | Attending: Ophthalmology

## 2015-10-22 ENCOUNTER — Ambulatory Visit (HOSPITAL_COMMUNITY)
Admission: RE | Admit: 2015-10-22 | Discharge: 2015-10-22 | Disposition: A | Discharge status: Home / Self Care | Payer: PPO | Source: Ambulatory Visit | Attending: Ophthalmology | Admitting: Ophthalmology

## 2015-10-22 DIAGNOSIS — H269 Unspecified cataract: Secondary | ICD-10-CM | POA: Diagnosis not present

## 2015-10-22 DIAGNOSIS — H26491 Other secondary cataract, right eye: Secondary | ICD-10-CM | POA: Diagnosis not present

## 2015-10-22 DIAGNOSIS — H538 Other visual disturbances: Secondary | ICD-10-CM | POA: Diagnosis not present

## 2015-10-22 HISTORY — PX: YAG LASER APPLICATION: SHX6189

## 2015-10-22 SURGERY — TREATMENT, USING YAG LASER
Anesthesia: LOCAL | Laterality: Right

## 2015-10-22 MED ORDER — CYCLOPENTOLATE-PHENYLEPHRINE OP SOLN OPTIME - NO CHARGE
OPHTHALMIC | Status: AC
  Filled 2015-10-22: qty 2

## 2015-10-22 MED ORDER — CYCLOPENTOLATE-PHENYLEPHRINE 0.2-1 % OP SOLN
1.0000 [drp] | OPHTHALMIC | Status: AC
  Administered 2015-10-22 (×3): 1 [drp] via OPHTHALMIC

## 2015-10-22 MED ORDER — TETRACAINE HCL 0.5 % OP SOLN
OPHTHALMIC | Status: AC
  Filled 2015-10-22: qty 4

## 2015-10-22 MED ORDER — TETRACAINE HCL 0.5 % OP SOLN
1.0000 [drp] | Freq: Once | OPHTHALMIC | Status: AC
  Administered 2015-10-22: 1 [drp] via OPHTHALMIC

## 2015-10-22 NOTE — Discharge Instructions (Signed)
[place]  10/22/2015     Instructions    Activity: No Restrictions.   Diet: Resume Diet you were on at home.   Pain Medication: Tylenol if Needed.   CONTACT YOUR DOCTOR IF YOU HAVE PAIN, REDNESS IN YOUR EYE, OR DECREASED VISION.   Follow-up:in 3 weeks with Williams Che, MD.   Dr. Gershon Crane: [phone]  Dr. Iona HansenJI:[id number]  Dr. Geoffry ParadiseID:[id number]   If you find that you cannot contact your physician, but feel that your signs and   Symptoms warrant a physician's attention, call the Emergency Room at   [phone] ext.[phone].   Othern/a.   FOLLOW UP WITH DR. HAINES ON 11/13/2015 @ 11:15 AM AT HIS OFFICE

## 2015-10-22 NOTE — Brief Op Note (Signed)
[place] 10/22/2015  Williams Che, MD  Pre-op Diagnosis:  secondary cataract right eye  Post-op Diagnosis:   same  Yag laser self-test completed: Yes.    Indications:  See scanned office H&p for details  Procedure:   YAG Posterior capsulotomy  OD Eye protection worn by staff:  Yes.   Laser In Use sign on door:  Yes.    Laser:  {LUMENIS YAG/SLT LASER  Power Setting:  1.7 mJ/burst Anatomical site treated:  Posterior capsule Number of applications:  32 Total energy delivered: 53.83 mJ Results:  Open visual axis  Patient was instructed to go to the office, as previously scheduled, for intraocular pressure:  No.  Patient verbalizes understanding of discharge instructions:  Yes.    Notes:   Patient tolerated procedure well without complications

## 2015-10-23 ENCOUNTER — Encounter (HOSPITAL_COMMUNITY): Payer: Self-pay | Admitting: Ophthalmology

## 2015-11-12 DIAGNOSIS — I10 Essential (primary) hypertension: Secondary | ICD-10-CM | POA: Diagnosis not present

## 2015-11-12 DIAGNOSIS — Z79899 Other long term (current) drug therapy: Secondary | ICD-10-CM | POA: Diagnosis not present

## 2015-11-12 DIAGNOSIS — E876 Hypokalemia: Secondary | ICD-10-CM | POA: Diagnosis not present

## 2015-11-12 DIAGNOSIS — E785 Hyperlipidemia, unspecified: Secondary | ICD-10-CM | POA: Diagnosis not present

## 2015-11-12 DIAGNOSIS — E119 Type 2 diabetes mellitus without complications: Secondary | ICD-10-CM | POA: Diagnosis not present

## 2015-11-20 DIAGNOSIS — Z0001 Encounter for general adult medical examination with abnormal findings: Secondary | ICD-10-CM | POA: Diagnosis not present

## 2015-11-20 DIAGNOSIS — I10 Essential (primary) hypertension: Secondary | ICD-10-CM | POA: Diagnosis not present

## 2015-11-20 DIAGNOSIS — E119 Type 2 diabetes mellitus without complications: Secondary | ICD-10-CM | POA: Diagnosis not present

## 2015-11-20 DIAGNOSIS — Z6833 Body mass index (BMI) 33.0-33.9, adult: Secondary | ICD-10-CM | POA: Diagnosis not present

## 2015-11-26 ENCOUNTER — Encounter (HOSPITAL_COMMUNITY)
Admission: RE | Disposition: A | Discharge status: Home / Self Care | Payer: Self-pay | Source: Ambulatory Visit | Attending: Ophthalmology

## 2015-11-26 ENCOUNTER — Encounter (HOSPITAL_COMMUNITY): Payer: Self-pay

## 2015-11-26 ENCOUNTER — Ambulatory Visit (HOSPITAL_COMMUNITY)
Admission: RE | Admit: 2015-11-26 | Discharge: 2015-11-26 | Disposition: A | Discharge status: Home / Self Care | Payer: PPO | Source: Ambulatory Visit | Attending: Ophthalmology | Admitting: Ophthalmology

## 2015-11-26 DIAGNOSIS — I10 Essential (primary) hypertension: Secondary | ICD-10-CM | POA: Insufficient documentation

## 2015-11-26 DIAGNOSIS — H524 Presbyopia: Secondary | ICD-10-CM | POA: Insufficient documentation

## 2015-11-26 DIAGNOSIS — Z961 Presence of intraocular lens: Secondary | ICD-10-CM | POA: Insufficient documentation

## 2015-11-26 DIAGNOSIS — H52209 Unspecified astigmatism, unspecified eye: Secondary | ICD-10-CM | POA: Diagnosis not present

## 2015-11-26 DIAGNOSIS — H2640 Unspecified secondary cataract: Secondary | ICD-10-CM | POA: Insufficient documentation

## 2015-11-26 DIAGNOSIS — H5200 Hypermetropia, unspecified eye: Secondary | ICD-10-CM | POA: Insufficient documentation

## 2015-11-26 DIAGNOSIS — H26492 Other secondary cataract, left eye: Secondary | ICD-10-CM | POA: Diagnosis not present

## 2015-11-26 DIAGNOSIS — H538 Other visual disturbances: Secondary | ICD-10-CM | POA: Diagnosis not present

## 2015-11-26 HISTORY — PX: YAG LASER APPLICATION: SHX6189

## 2015-11-26 SURGERY — TREATMENT, USING YAG LASER
Anesthesia: LOCAL | Laterality: Left

## 2015-11-26 MED ORDER — TROPICAMIDE 1 % OP SOLN
1.0000 [drp] | OPHTHALMIC | Status: AC
  Administered 2015-11-26 (×3): 1 [drp] via OPHTHALMIC

## 2015-11-26 MED ORDER — TETRACAINE HCL 0.5 % OP SOLN
1.0000 [drp] | Freq: Once | OPHTHALMIC | Status: AC
  Administered 2015-11-26: 1 [drp] via OPHTHALMIC

## 2015-11-26 MED ORDER — TETRACAINE HCL 0.5 % OP SOLN
OPHTHALMIC | Status: AC
  Filled 2015-11-26: qty 4

## 2015-11-26 MED ORDER — TROPICAMIDE 1 % OP SOLN
OPHTHALMIC | Status: AC
  Filled 2015-11-26: qty 3

## 2015-11-26 NOTE — Discharge Instructions (Signed)
[place]  11/26/2015     Instructions    Activity: No Restrictions.   Diet: Resume Diet you were on at home.   Pain Medication: Tylenol if Needed.   CONTACT YOUR DOCTOR IF YOU HAVE PAIN, REDNESS IN YOUR EYE, OR DECREASED VISION.   Follow-up:in 3 weeks with Williams Che, MD.   Dr. Gershon Crane: [phone]  Dr. Iona HansenJI:[id number]  Dr. Geoffry ParadiseID:[id number]   If you find that you cannot contact your physician, but feel that your signs and   Symptoms warrant a physician's attention, call the Emergency Room at   [phone] [phone].   Othern/a.  FOLLOW UP APPOINTMENT 12/18/2015 @ 10:15 AM

## 2015-11-26 NOTE — Brief Op Note (Signed)
[place] 11/26/2015  Williams Che, MD  Pre-op Diagnosis:  secondary cataract left eye  Post-op Diagnosis:    same  Yag laser self-test completed: Yes.    Indications:  Blurred vision, see scanned office h&P for details  Procedure:   YAG posterior capsulotomy OS  Eye protection worn by staff:  Yes.   Laser In Use sign on door:  Yes.    Laser:  {LUMENIS YAG/SLT LASER  Power Setting:  1.7 mJ/burst Anatomical site treated:  Posterior capsule OS Number of applications:  52 Total energy delivered: 83.42 mJ Results:  Cleared visual axis  The patient was discharged home with instructions to continue all her current glaucoma medications in the un-operated eye, and discontinue all her current glaucoma medications, if any.  Patient was instructed to go to the office, as previously scheduled, for intraocular pressure:  Yes.    Patient verbalizes understanding of discharge instructions:  Yes.    Notes:   Pt tolerated procedure well without complication

## 2015-11-27 DIAGNOSIS — J3081 Allergic rhinitis due to animal (cat) (dog) hair and dander: Secondary | ICD-10-CM | POA: Diagnosis not present

## 2015-11-27 DIAGNOSIS — J309 Allergic rhinitis, unspecified: Secondary | ICD-10-CM | POA: Diagnosis not present

## 2015-11-27 DIAGNOSIS — R05 Cough: Secondary | ICD-10-CM | POA: Diagnosis not present

## 2015-11-27 DIAGNOSIS — J3089 Other allergic rhinitis: Secondary | ICD-10-CM | POA: Diagnosis not present

## 2015-11-28 ENCOUNTER — Encounter (HOSPITAL_COMMUNITY): Payer: Self-pay | Admitting: Ophthalmology

## 2016-01-04 DIAGNOSIS — Z23 Encounter for immunization: Secondary | ICD-10-CM | POA: Diagnosis not present

## 2016-03-21 DIAGNOSIS — E119 Type 2 diabetes mellitus without complications: Secondary | ICD-10-CM | POA: Diagnosis not present

## 2016-03-21 DIAGNOSIS — Z79899 Other long term (current) drug therapy: Secondary | ICD-10-CM | POA: Diagnosis not present

## 2016-03-21 DIAGNOSIS — E785 Hyperlipidemia, unspecified: Secondary | ICD-10-CM | POA: Diagnosis not present

## 2016-03-21 DIAGNOSIS — M25551 Pain in right hip: Secondary | ICD-10-CM | POA: Diagnosis not present

## 2016-03-21 DIAGNOSIS — E876 Hypokalemia: Secondary | ICD-10-CM | POA: Diagnosis not present

## 2016-03-21 DIAGNOSIS — M1611 Unilateral primary osteoarthritis, right hip: Secondary | ICD-10-CM | POA: Diagnosis not present

## 2016-03-21 DIAGNOSIS — I10 Essential (primary) hypertension: Secondary | ICD-10-CM | POA: Diagnosis not present

## 2016-03-28 DIAGNOSIS — E119 Type 2 diabetes mellitus without complications: Secondary | ICD-10-CM | POA: Diagnosis not present

## 2016-03-28 DIAGNOSIS — N183 Chronic kidney disease, stage 3 (moderate): Secondary | ICD-10-CM | POA: Diagnosis not present

## 2016-03-28 DIAGNOSIS — I10 Essential (primary) hypertension: Secondary | ICD-10-CM | POA: Diagnosis not present

## 2016-05-01 DIAGNOSIS — M1611 Unilateral primary osteoarthritis, right hip: Secondary | ICD-10-CM | POA: Diagnosis not present

## 2016-06-11 ENCOUNTER — Inpatient Hospital Stay: Admission: RE | Admit: 2016-06-11 | Payer: PPO | Source: Ambulatory Visit

## 2016-06-16 ENCOUNTER — Encounter
Admission: RE | Admit: 2016-06-16 | Discharge: 2016-06-16 | Disposition: A | Discharge status: Home / Self Care | Payer: PPO | Source: Ambulatory Visit | Attending: Orthopedic Surgery | Admitting: Orthopedic Surgery

## 2016-06-16 DIAGNOSIS — I10 Essential (primary) hypertension: Secondary | ICD-10-CM | POA: Diagnosis not present

## 2016-06-16 DIAGNOSIS — Z01810 Encounter for preprocedural cardiovascular examination: Secondary | ICD-10-CM | POA: Insufficient documentation

## 2016-06-16 DIAGNOSIS — R001 Bradycardia, unspecified: Secondary | ICD-10-CM | POA: Diagnosis not present

## 2016-06-16 DIAGNOSIS — Z01812 Encounter for preprocedural laboratory examination: Secondary | ICD-10-CM | POA: Diagnosis not present

## 2016-06-16 LAB — APTT: APTT: 29 s (ref 24–36)

## 2016-06-16 LAB — URINALYSIS, ROUTINE W REFLEX MICROSCOPIC
BILIRUBIN URINE: NEGATIVE
Glucose, UA: NEGATIVE mg/dL
Hgb urine dipstick: NEGATIVE
KETONES UR: NEGATIVE mg/dL
Leukocytes, UA: NEGATIVE
NITRITE: NEGATIVE
Protein, ur: NEGATIVE mg/dL
SPECIFIC GRAVITY, URINE: 1.018 (ref 1.005–1.030)
pH: 7 (ref 5.0–8.0)

## 2016-06-16 LAB — COMPREHENSIVE METABOLIC PANEL
ALK PHOS: 71 U/L (ref 38–126)
ALT: 18 U/L (ref 14–54)
AST: 19 U/L (ref 15–41)
Albumin: 4.2 g/dL (ref 3.5–5.0)
Anion gap: 9 (ref 5–15)
BILIRUBIN TOTAL: 0.6 mg/dL (ref 0.3–1.2)
BUN: 10 mg/dL (ref 6–20)
CO2: 28 mmol/L (ref 22–32)
Calcium: 9.2 mg/dL (ref 8.9–10.3)
Chloride: 98 mmol/L — ABNORMAL LOW (ref 101–111)
Creatinine, Ser: 0.82 mg/dL (ref 0.44–1.00)
GFR calc Af Amer: >60 mL/min (ref >60)
GFR calc non Af Amer: >60 mL/min (ref >60)
Glucose, Bld: 116 mg/dL — ABNORMAL HIGH (ref 65–99)
POTASSIUM: 3.1 mmol/L — AB (ref 3.5–5.1)
Sodium: 135 mmol/L (ref 135–145)
TOTAL PROTEIN: 7.5 g/dL (ref 6.5–8.1)

## 2016-06-16 LAB — CBC
HEMATOCRIT: 36.6 % (ref 35.0–47.0)
HEMOGLOBIN: 12.9 g/dL (ref 12.0–16.0)
MCH: 29.9 pg (ref 26.0–34.0)
MCHC: 35.2 g/dL (ref 32.0–36.0)
MCV: 84.9 fL (ref 80.0–100.0)
Platelets: 205 10*3/uL (ref 150–440)
RBC: 4.31 MIL/uL (ref 3.80–5.20)
RDW: 13.4 % (ref 11.5–14.5)
WBC: 5.9 10*3/uL (ref 3.6–11.0)

## 2016-06-16 LAB — TYPE AND SCREEN
ABO/RH(D): A POS
ANTIBODY SCREEN: NEGATIVE

## 2016-06-16 LAB — PROTIME-INR
INR: 0.98
Prothrombin Time: 13 seconds (ref 11.4–15.2)

## 2016-06-16 LAB — SEDIMENTATION RATE: SED RATE: 15 mm/h (ref 0–30)

## 2016-06-16 LAB — SURGICAL PCR SCREEN
MRSA, PCR: NEGATIVE
Staphylococcus aureus: NEGATIVE

## 2016-06-16 LAB — C-REACTIVE PROTEIN

## 2016-06-16 MED ORDER — KETOROLAC TROMETHAMINE 30 MG/ML IJ SOLN
INTRAMUSCULAR | Status: AC
  Filled 2016-06-16: qty 1

## 2016-06-16 NOTE — Patient Instructions (Signed)
  Your procedure is scheduled OQ:HUTMLY June 23, 2016. Report to Same Day Surgery. To find out your arrival time please call [phone] between 1PM - 3PM on Friday June 20, 2016 .  Remember: Instructions that are not followed completely may result in serious medical risk, up to and including death, or upon the discretion of your surgeon and anesthesiologist your surgery may need to be rescheduled.    _x___ 1. Do not eat food or drink liquids after midnight. No gum chewing or hard candies.     ____ 2. No Alcohol for 24 hours before or after surgery.   ____ 3. Bring all medications with you on the day of surgery if instructed.    __x__ 4. Notify your doctor if there is any change in your medical condition     (cold, fever, infections).    _____ 5. No smoking 24 hours prior to surgery.     Do not wear jewelry, make-up, hairpins, clips or nail polish.  Do not wear lotions, powders, or perfumes.   Do not shave 48 hours prior to surgery. Men may shave face and neck.  Do not bring valuables to the hospital.    [facility] is not responsible for any belongings or valuables.               Contacts, dentures or bridgework may not be worn into surgery.  Leave your suitcase in the car. After surgery it may be brought to your room.  For patients admitted to the hospital, discharge time is determined by your treatment team.   Patients discharged the day of surgery will not be allowed to drive home.    Please read over the following fact sheets that you were given:   [facility] Preparing for Surgery  _x___ Take these medicines the morning of surgery with A SIP OF WATER:    1. atenolol (TENORMIN)   2. sertraline (ZOLOFT)  3. simvastatin (ZOCOR)   ____ Fleet Enema (as directed)   _x___ Use CHG Soap as directed on instruction sheet  ____ Use inhalers on the day of surgery and bring to hospital day of surgery  ____ Stop metformin 2 days prior to surgery    ____ Take 1/2 of usual  insulin dose the night before surgery and none on the morning of surgery.   ____ Stop Coumadin/Plavix/aspirin on does not apply.  __x__ Stop Anti-inflammatories such as Advil, Aleve, Ibuprofen, Motrin, Naproxen, Naprosyn, Goodies powders or aspirin products. OK to take Tylenol.   ____ Stop supplements until after surgery.    ____ Bring C-Pap to the hospital.

## 2016-06-16 NOTE — Progress Notes (Signed)
Pt reports she does not sleep well at night, wakes up after 2-3 hours of sleep.  Usually sleeps 6-7 hours of interrupted sleep.Marland Kitchen

## 2016-06-16 NOTE — Pre-Procedure Instructions (Signed)
Pt brought her meds to her PAT appt. for review with RN. Pt reports she was on an antibiotic for chest congestion, she has finished this antibiotic and Dente Robert told pt her lungs were clear at her appt with him last week.  Pt denies she has a fever, nausea, vomiting or coughing up anything with a color.Spurgeon Robert told pt she can proceed with surgery.

## 2016-06-17 LAB — URINE CULTURE: SPECIAL REQUESTS: NORMAL

## 2016-06-17 NOTE — Pre-Procedure Instructions (Signed)
Labs with kt 3.1 faxed to dr Marry Guan

## 2016-06-22 MED ORDER — TRANEXAMIC ACID 1000 MG/10ML IV SOLN
1000.0000 mg | INTRAVENOUS | Status: DC
  Filled 2016-06-22: qty 10

## 2016-06-22 MED ORDER — FAMOTIDINE 20 MG PO TABS
20.0000 mg | ORAL_TABLET | Freq: Once | ORAL | Status: AC
  Administered 2016-06-23: 20 mg via ORAL

## 2016-06-22 MED ORDER — CLINDAMYCIN PHOSPHATE 900 MG/50ML IV SOLN: 900.0000 mg | INTRAVENOUS | Status: DC

## 2016-06-23 ENCOUNTER — Inpatient Hospital Stay
Admission: RE | Admit: 2016-06-23 | Discharge: 2016-06-25 | DRG: 470 | Disposition: A | Discharge status: Home Health | Payer: PPO | Source: Ambulatory Visit | Attending: Orthopedic Surgery | Admitting: Orthopedic Surgery

## 2016-06-23 ENCOUNTER — Inpatient Hospital Stay: Payer: PPO | Admitting: Anesthesiology

## 2016-06-23 ENCOUNTER — Inpatient Hospital Stay: Payer: PPO

## 2016-06-23 ENCOUNTER — Encounter: Payer: Self-pay | Admitting: Orthopedic Surgery

## 2016-06-23 ENCOUNTER — Encounter
Admission: RE | Disposition: A | Discharge status: Home Health | Payer: Self-pay | Source: Ambulatory Visit | Attending: Orthopedic Surgery

## 2016-06-23 DIAGNOSIS — I10 Essential (primary) hypertension: Secondary | ICD-10-CM | POA: Diagnosis not present

## 2016-06-23 DIAGNOSIS — F419 Anxiety disorder, unspecified: Secondary | ICD-10-CM | POA: Diagnosis present

## 2016-06-23 DIAGNOSIS — Z881 Allergy status to other antibiotic agents status: Secondary | ICD-10-CM

## 2016-06-23 DIAGNOSIS — K219 Gastro-esophageal reflux disease without esophagitis: Secondary | ICD-10-CM | POA: Diagnosis not present

## 2016-06-23 DIAGNOSIS — Z85820 Personal history of malignant melanoma of skin: Secondary | ICD-10-CM

## 2016-06-23 DIAGNOSIS — D62 Acute posthemorrhagic anemia: Secondary | ICD-10-CM | POA: Diagnosis not present

## 2016-06-23 DIAGNOSIS — F329 Major depressive disorder, single episode, unspecified: Secondary | ICD-10-CM | POA: Diagnosis not present

## 2016-06-23 DIAGNOSIS — M1611 Unilateral primary osteoarthritis, right hip: Principal | ICD-10-CM | POA: Diagnosis present

## 2016-06-23 DIAGNOSIS — G2581 Restless legs syndrome: Secondary | ICD-10-CM | POA: Diagnosis not present

## 2016-06-23 DIAGNOSIS — E785 Hyperlipidemia, unspecified: Secondary | ICD-10-CM | POA: Diagnosis present

## 2016-06-23 DIAGNOSIS — Z96642 Presence of left artificial hip joint: Secondary | ICD-10-CM | POA: Diagnosis not present

## 2016-06-23 DIAGNOSIS — Z96649 Presence of unspecified artificial hip joint: Secondary | ICD-10-CM

## 2016-06-23 DIAGNOSIS — Z96641 Presence of right artificial hip joint: Secondary | ICD-10-CM | POA: Diagnosis not present

## 2016-06-23 HISTORY — PX: TOTAL HIP ARTHROPLASTY: SHX124

## 2016-06-23 LAB — POCT I-STAT 4, (NA,K, GLUC, HGB,HCT)
GLUCOSE: 117 mg/dL — AB (ref 65–99)
HEMATOCRIT: 34 % — AB (ref 36.0–46.0)
Hemoglobin: 11.6 g/dL — ABNORMAL LOW (ref 12.0–15.0)
POTASSIUM: 3.8 mmol/L (ref 3.5–5.1)
SODIUM: 139 mmol/L (ref 135–145)

## 2016-06-23 SURGERY — ARTHROPLASTY, HIP, TOTAL,POSTERIOR APPROACH
Anesthesia: Spinal | Site: Hip | Laterality: Right | Wound class: Clean

## 2016-06-23 MED ORDER — FAMOTIDINE 20 MG PO TABS
ORAL_TABLET | ORAL | Status: AC
  Filled 2016-06-23: qty 1

## 2016-06-23 MED ORDER — POTASSIUM CHLORIDE CRYS ER 20 MEQ PO TBCR
20.0000 meq | EXTENDED_RELEASE_TABLET | Freq: Two times a day (BID) | ORAL | Status: DC
  Administered 2016-06-23 – 2016-06-25 (×4): 20 meq via ORAL
  Filled 2016-06-23 (×4): qty 1

## 2016-06-23 MED ORDER — PROPOFOL 500 MG/50ML IV EMUL
INTRAVENOUS | Status: AC
  Filled 2016-06-23: qty 50

## 2016-06-23 MED ORDER — PROPOFOL 500 MG/50ML IV EMUL
INTRAVENOUS | Status: DC | PRN
  Administered 2016-06-23: 50 ug/kg/min via INTRAVENOUS

## 2016-06-23 MED ORDER — CLINDAMYCIN PHOSPHATE 900 MG/50ML IV SOLN
INTRAVENOUS | Status: DC | PRN
  Administered 2016-06-23: 900 mg via INTRAVENOUS

## 2016-06-23 MED ORDER — BUPIVACAINE HCL (PF) 0.5 % IJ SOLN
INTRAMUSCULAR | Status: DC | PRN
  Administered 2016-06-23: 3 mL

## 2016-06-23 MED ORDER — BISACODYL 10 MG RE SUPP: 10.0000 mg | Freq: Every day | RECTAL | Status: DC | PRN

## 2016-06-23 MED ORDER — ALUM & MAG HYDROXIDE-SIMETH 200-200-20 MG/5ML PO SUSP: 30.0000 mL | ORAL | Status: DC | PRN

## 2016-06-23 MED ORDER — PROPOFOL 10 MG/ML IV BOLUS
INTRAVENOUS | Status: DC | PRN
  Administered 2016-06-23: 30 mg via INTRAVENOUS

## 2016-06-23 MED ORDER — SIMVASTATIN 20 MG PO TABS
20.0000 mg | ORAL_TABLET | Freq: Every day | ORAL | Status: DC
  Administered 2016-06-24 – 2016-06-25 (×2): 20 mg via ORAL
  Filled 2016-06-23 (×3): qty 1

## 2016-06-23 MED ORDER — SENNOSIDES-DOCUSATE SODIUM 8.6-50 MG PO TABS
1.0000 | ORAL_TABLET | Freq: Two times a day (BID) | ORAL | Status: DC
  Administered 2016-06-23 – 2016-06-25 (×5): 1 via ORAL
  Filled 2016-06-23 (×5): qty 1

## 2016-06-23 MED ORDER — CLINDAMYCIN PHOSPHATE 600 MG/50ML IV SOLN
600.0000 mg | Freq: Four times a day (QID) | INTRAVENOUS | Status: AC
  Administered 2016-06-23 – 2016-06-24 (×3): 600 mg via INTRAVENOUS
  Filled 2016-06-23 (×4): qty 50

## 2016-06-23 MED ORDER — ACETAMINOPHEN 650 MG RE SUPP: 650.0000 mg | Freq: Four times a day (QID) | RECTAL | Status: DC | PRN

## 2016-06-23 MED ORDER — CLINDAMYCIN PHOSPHATE 900 MG/50ML IV SOLN
INTRAVENOUS | Status: AC
  Filled 2016-06-23: qty 50

## 2016-06-23 MED ORDER — TRAZODONE HCL 100 MG PO TABS: 100.0000 mg | ORAL_TABLET | Freq: Every evening | ORAL | Status: DC | PRN

## 2016-06-23 MED ORDER — TRANEXAMIC ACID 1000 MG/10ML IV SOLN
1000.0000 mg | Freq: Once | INTRAVENOUS | Status: AC
  Administered 2016-06-23: 1000 mg via INTRAVENOUS
  Filled 2016-06-23: qty 10

## 2016-06-23 MED ORDER — TRANEXAMIC ACID 1000 MG/10ML IV SOLN
INTRAVENOUS | Status: DC | PRN
  Administered 2016-06-23: 1000 mg via INTRAVENOUS

## 2016-06-23 MED ORDER — MAGNESIUM HYDROXIDE 400 MG/5ML PO SUSP
30.0000 mL | Freq: Every day | ORAL | Status: DC | PRN
  Administered 2016-06-24: 30 mL via ORAL
  Filled 2016-06-23: qty 30

## 2016-06-23 MED ORDER — PANTOPRAZOLE SODIUM 40 MG PO TBEC
40.0000 mg | DELAYED_RELEASE_TABLET | Freq: Two times a day (BID) | ORAL | Status: DC
  Administered 2016-06-23 – 2016-06-25 (×4): 40 mg via ORAL
  Filled 2016-06-23 (×4): qty 1

## 2016-06-23 MED ORDER — ONDANSETRON HCL 4 MG/2ML IJ SOLN: 4.0000 mg | Freq: Once | INTRAMUSCULAR | Status: DC | PRN

## 2016-06-23 MED ORDER — BUPIVACAINE HCL (PF) 0.5 % IJ SOLN
INTRAMUSCULAR | Status: AC
Start: 2016-06-23 — End: 2016-06-23
  Filled 2016-06-23: qty 10

## 2016-06-23 MED ORDER — CHLORHEXIDINE GLUCONATE 4 % EX LIQD: 60.0000 mL | Freq: Once | CUTANEOUS | Status: DC

## 2016-06-23 MED ORDER — ENOXAPARIN SODIUM 30 MG/0.3ML ~~LOC~~ SOLN
30.0000 mg | Freq: Two times a day (BID) | SUBCUTANEOUS | Status: DC
  Administered 2016-06-24 – 2016-06-25 (×3): 30 mg via SUBCUTANEOUS
  Filled 2016-06-23 (×3): qty 0.3

## 2016-06-23 MED ORDER — PHENOL 1.4 % MT LIQD: 1.0000 | OROMUCOSAL | Status: DC | PRN

## 2016-06-23 MED ORDER — EPHEDRINE SULFATE 50 MG/ML IJ SOLN
INTRAMUSCULAR | Status: AC
Start: 2016-06-23 — End: 2016-06-23
  Filled 2016-06-23: qty 1

## 2016-06-23 MED ORDER — MORPHINE SULFATE (PF) 2 MG/ML IV SOLN
2.0000 mg | INTRAVENOUS | Status: DC | PRN
  Administered 2016-06-23: 2 mg via INTRAVENOUS
  Filled 2016-06-23: qty 1

## 2016-06-23 MED ORDER — ACETAMINOPHEN 10 MG/ML IV SOLN
1000.0000 mg | Freq: Four times a day (QID) | INTRAVENOUS | Status: AC
  Administered 2016-06-23 – 2016-06-24 (×3): 1000 mg via INTRAVENOUS
  Filled 2016-06-23 (×4): qty 100

## 2016-06-23 MED ORDER — ONDANSETRON HCL 4 MG PO TABS: 4.0000 mg | ORAL_TABLET | Freq: Four times a day (QID) | ORAL | Status: DC | PRN

## 2016-06-23 MED ORDER — ATENOLOL 50 MG PO TABS
50.0000 mg | ORAL_TABLET | Freq: Every day | ORAL | Status: DC
  Administered 2016-06-24 – 2016-06-25 (×2): 50 mg via ORAL
  Filled 2016-06-23 (×2): qty 1

## 2016-06-23 MED ORDER — SODIUM CHLORIDE 0.9 % IJ SOLN
INTRAMUSCULAR | Status: AC
Start: 2016-06-23 — End: 2016-06-23
  Filled 2016-06-23: qty 10

## 2016-06-23 MED ORDER — DIPHENHYDRAMINE HCL 12.5 MG/5ML PO ELIX: 12.5000 mg | ORAL_SOLUTION | ORAL | Status: DC | PRN

## 2016-06-23 MED ORDER — METOCLOPRAMIDE HCL 10 MG PO TABS
10.0000 mg | ORAL_TABLET | Freq: Three times a day (TID) | ORAL | Status: DC
  Administered 2016-06-23 – 2016-06-25 (×5): 10 mg via ORAL
  Filled 2016-06-23 (×5): qty 1

## 2016-06-23 MED ORDER — MENTHOL 3 MG MT LOZG: 1.0000 | LOZENGE | OROMUCOSAL | Status: DC | PRN

## 2016-06-23 MED ORDER — SODIUM CHLORIDE 0.9 % IV SOLN
INTRAVENOUS | Status: DC | PRN
  Administered 2016-06-23: 25 ug/min via INTRAVENOUS

## 2016-06-23 MED ORDER — ONDANSETRON HCL 4 MG/2ML IJ SOLN: 4.0000 mg | Freq: Four times a day (QID) | INTRAMUSCULAR | Status: DC | PRN

## 2016-06-23 MED ORDER — LACTATED RINGERS IV SOLN
INTRAVENOUS | Status: DC
  Administered 2016-06-23 (×2): via INTRAVENOUS

## 2016-06-23 MED ORDER — CELECOXIB 200 MG PO CAPS
200.0000 mg | ORAL_CAPSULE | Freq: Two times a day (BID) | ORAL | Status: DC
  Administered 2016-06-23 – 2016-06-25 (×5): 200 mg via ORAL
  Filled 2016-06-23 (×5): qty 1

## 2016-06-23 MED ORDER — ONDANSETRON HCL 4 MG/2ML IJ SOLN
INTRAMUSCULAR | Status: AC
  Filled 2016-06-23: qty 2

## 2016-06-23 MED ORDER — OXYCODONE HCL 5 MG PO TABS
5.0000 mg | ORAL_TABLET | ORAL | Status: DC | PRN
  Administered 2016-06-23: 5 mg via ORAL
  Administered 2016-06-23 – 2016-06-25 (×5): 10 mg via ORAL
  Filled 2016-06-23 (×4): qty 2
  Filled 2016-06-23: qty 1
  Filled 2016-06-23: qty 2

## 2016-06-23 MED ORDER — SODIUM CHLORIDE FLUSH 0.9 % IV SOLN
INTRAVENOUS | Status: AC
  Filled 2016-06-23: qty 10

## 2016-06-23 MED ORDER — ONDANSETRON HCL 4 MG/2ML IJ SOLN
INTRAMUSCULAR | Status: DC | PRN
  Administered 2016-06-23: 4 mg via INTRAVENOUS

## 2016-06-23 MED ORDER — ROPINIROLE HCL 1 MG PO TABS
1.0000 mg | ORAL_TABLET | Freq: Every day | ORAL | Status: DC
  Administered 2016-06-23 – 2016-06-24 (×2): 1 mg via ORAL
  Filled 2016-06-23 (×3): qty 1

## 2016-06-23 MED ORDER — ACETAMINOPHEN 10 MG/ML IV SOLN
INTRAVENOUS | Status: DC | PRN
  Administered 2016-06-23: 1000 mg via INTRAVENOUS

## 2016-06-23 MED ORDER — FENTANYL CITRATE (PF) 100 MCG/2ML IJ SOLN: 25.0000 ug | INTRAMUSCULAR | Status: DC | PRN

## 2016-06-23 MED ORDER — SODIUM CHLORIDE 0.9 % IV SOLN
INTRAVENOUS | Status: DC
  Administered 2016-06-23 (×2): via INTRAVENOUS

## 2016-06-23 MED ORDER — ACETAMINOPHEN 10 MG/ML IV SOLN
INTRAVENOUS | Status: AC
  Filled 2016-06-23: qty 100

## 2016-06-23 MED ORDER — NEOMYCIN-POLYMYXIN B GU 40-200000 IR SOLN
Status: DC | PRN
  Administered 2016-06-23: 14 mL

## 2016-06-23 MED ORDER — ACETAMINOPHEN 325 MG PO TABS: 650.0000 mg | ORAL_TABLET | Freq: Four times a day (QID) | ORAL | Status: DC | PRN

## 2016-06-23 MED ORDER — TRAMADOL HCL 50 MG PO TABS
50.0000 mg | ORAL_TABLET | ORAL | Status: DC | PRN
  Administered 2016-06-23 (×2): 100 mg via ORAL
  Filled 2016-06-23 (×2): qty 2

## 2016-06-23 MED ORDER — HYDROCHLOROTHIAZIDE 25 MG PO TABS
25.0000 mg | ORAL_TABLET | Freq: Every day | ORAL | Status: DC
  Administered 2016-06-24 – 2016-06-25 (×2): 25 mg via ORAL
  Filled 2016-06-23 (×2): qty 1

## 2016-06-23 MED ORDER — POLYVINYL ALCOHOL 1.4 % OP SOLN: 1.0000 [drp] | Freq: Every day | OPHTHALMIC | Status: DC | PRN

## 2016-06-23 MED ORDER — NEOMYCIN-POLYMYXIN B GU 40-200000 IR SOLN
Status: AC
Start: 2016-06-23 — End: 2016-06-23
  Filled 2016-06-23: qty 20

## 2016-06-23 MED ORDER — FLEET ENEMA 7-19 GM/118ML RE ENEM: 1.0000 | ENEMA | Freq: Once | RECTAL | Status: DC | PRN

## 2016-06-23 MED ORDER — SERTRALINE HCL 100 MG PO TABS
100.0000 mg | ORAL_TABLET | Freq: Every day | ORAL | Status: DC
  Administered 2016-06-24 – 2016-06-25 (×2): 100 mg via ORAL
  Filled 2016-06-23 (×2): qty 1

## 2016-06-23 MED ORDER — FERROUS SULFATE 325 (65 FE) MG PO TABS
325.0000 mg | ORAL_TABLET | Freq: Two times a day (BID) | ORAL | Status: DC
  Administered 2016-06-24 – 2016-06-25 (×3): 325 mg via ORAL
  Filled 2016-06-23 (×3): qty 1

## 2016-06-23 SURGICAL SUPPLY — 60 items
BLADE DRUM FLTD (BLADE) ×3 IMPLANT
BLADE SAW 1 (BLADE) ×3 IMPLANT
CANISTER SUCT 1200ML W/VALVE (MISCELLANEOUS) ×3 IMPLANT
CANISTER SUCT 3000ML (MISCELLANEOUS) ×6 IMPLANT
CAPT HIP TOTAL 2 ×2 IMPLANT
CARTRIDGE OIL MAESTRO DRILL (MISCELLANEOUS) ×1 IMPLANT
CATH FOL LEG HOLDER (MISCELLANEOUS) ×3 IMPLANT
CATH TRAY METER 16FR LF (MISCELLANEOUS) ×3 IMPLANT
COVER MAYO STAND STRL (DRAPES) ×2 IMPLANT
DIFFUSER MAESTRO (MISCELLANEOUS) ×3 IMPLANT
DRAPE INCISE IOBAN 66X45 STRL (DRAPES) ×2 IMPLANT
DRAPE INCISE IOBAN 66X60 STRL (DRAPES) ×3 IMPLANT
DRAPE SHEET LG 3/4 BI-LAMINATE (DRAPES) ×3 IMPLANT
DRSG DERMACEA 8X12 NADH (GAUZE/BANDAGES/DRESSINGS) ×3 IMPLANT
DRSG OPSITE POSTOP 3X4 (GAUZE/BANDAGES/DRESSINGS) ×3 IMPLANT
DRSG OPSITE POSTOP 4X12 (GAUZE/BANDAGES/DRESSINGS) ×3 IMPLANT
DRSG OPSITE POSTOP 4X14 (GAUZE/BANDAGES/DRESSINGS) ×1 IMPLANT
DRSG TEGADERM 4X4.75 (GAUZE/BANDAGES/DRESSINGS) ×3 IMPLANT
DURAPREP 26ML APPLICATOR (WOUND CARE) ×3 IMPLANT
ELECT BLADE 6.5 EXT (BLADE) ×3 IMPLANT
ELECT CAUTERY BLADE 6.4 (BLADE) ×3 IMPLANT
GLOVE BIO SURGEON STRL SZ7 (GLOVE) ×8 IMPLANT
GLOVE BIOGEL M STRL SZ7.5 (GLOVE) ×10 IMPLANT
GLOVE BIOGEL PI IND STRL 9 (GLOVE) ×1 IMPLANT
GLOVE BIOGEL PI INDICATOR 9 (GLOVE) ×4
GLOVE INDICATOR 7.5 STRL GRN (GLOVE) ×8 IMPLANT
GLOVE INDICATOR 8.0 STRL GRN (GLOVE) ×5 IMPLANT
GLOVE SURG SYN 9.0  PF PI (GLOVE) ×4
GLOVE SURG SYN 9.0 PF PI (GLOVE) ×1 IMPLANT
GOWN STRL REUS W/ TWL LRG LVL3 (GOWN DISPOSABLE) ×2 IMPLANT
GOWN STRL REUS W/TWL 2XL LVL3 (GOWN DISPOSABLE) ×3 IMPLANT
GOWN STRL REUS W/TWL LRG LVL3 (GOWN DISPOSABLE) ×12
HANDPIECE INTERPULSE COAX TIP (DISPOSABLE) ×3
HEMOVAC 400CC 10FR (MISCELLANEOUS) ×3 IMPLANT
HOOD PEEL AWAY FLYTE STAYCOOL (MISCELLANEOUS) ×6 IMPLANT
KIT RM TURNOVER STRD PROC AR (KITS) ×3 IMPLANT
NDL SAFETY 18GX1.5 (NEEDLE) ×3 IMPLANT
NS IRRIG 500ML POUR BTL (IV SOLUTION) ×3 IMPLANT
OIL CARTRIDGE MAESTRO DRILL (MISCELLANEOUS) ×3
PACK HIP PROSTHESIS (MISCELLANEOUS) ×3 IMPLANT
PIN STEIN THRED 5/32 (Pin) ×3 IMPLANT
SET HNDPC FAN SPRY TIP SCT (DISPOSABLE) ×1 IMPLANT
SOL .9 NS 3000ML IRR  AL (IV SOLUTION) ×2
SOL .9 NS 3000ML IRR AL (IV SOLUTION) ×1
SOL .9 NS 3000ML IRR UROMATIC (IV SOLUTION) ×1 IMPLANT
SOL PREP PVP 2OZ (MISCELLANEOUS) ×3
SOLUTION PREP PVP 2OZ (MISCELLANEOUS) ×1 IMPLANT
SPONGE DRAIN TRACH 4X4 STRL 2S (GAUZE/BANDAGES/DRESSINGS) ×3 IMPLANT
STAPLER SKIN PROX 35W (STAPLE) ×3 IMPLANT
SUCTION FRAZIER HANDLE 10FR (MISCELLANEOUS) ×2
SUCTION TUBE FRAZIER 10FR DISP (MISCELLANEOUS) IMPLANT
SUT ETHIBOND #5 BRAIDED 30INL (SUTURE) ×3 IMPLANT
SUT VIC AB 0 CT1 36 (SUTURE) ×3 IMPLANT
SUT VIC AB 1 CT1 36 (SUTURE) ×6 IMPLANT
SUT VIC AB 2-0 CT1 27 (SUTURE) ×3
SUT VIC AB 2-0 CT1 TAPERPNT 27 (SUTURE) ×1 IMPLANT
SYR 20CC LL (SYRINGE) ×3 IMPLANT
TAPE ADH 3 LX (MISCELLANEOUS) ×3 IMPLANT
TAPE TRANSPORE STRL 2 31045 (GAUZE/BANDAGES/DRESSINGS) ×5 IMPLANT
TOWEL OR 17X26 4PK STRL BLUE (TOWEL DISPOSABLE) ×3 IMPLANT

## 2016-06-23 NOTE — H&P (Signed)
The patient has been re-examined, and the chart reviewed, and there have been no interval changes to the documented history and physical.    The risks, benefits, and alternatives have been discussed at length. The patient expressed understanding of the risks benefits and agreed with plans for surgical intervention.  James P. Hooten, Jr. M.D.    

## 2016-06-23 NOTE — Progress Notes (Signed)
Patient was admitted to room 155 from surgery vai room bed. A&O x4. Family at bedside. Foley, drain, foot pumps, TEDs, honeycomb dressing in place. Bed alarm on for safety. IV fluids started. POC started and reviewed orders

## 2016-06-23 NOTE — Transfer of Care (Signed)
Immediate Anesthesia Transfer of Care Note  Patient: Julie Sosa  Procedure(s) Performed: Procedure(s): TOTAL HIP ARTHROPLASTY (Right)  Patient Location: PACU  Anesthesia Type:Spinal  Level of Consciousness: awake and sedated  Airway & Oxygen Therapy: Patient Spontanous Breathing and Patient connected to nasal cannula oxygen  Post-op Assessment: Report given to RN and Post -op Vital signs reviewed and stable  Post vital signs: Reviewed and stable  Last Vitals:  Vitals:   06/23/16 0613  Pulse: 66  Resp: 18  Temp: 36.4 C    Last Pain:  Vitals:   06/23/16 0613  TempSrc: Oral         Complications: No apparent anesthesia complications

## 2016-06-23 NOTE — Evaluation (Signed)
Physical Therapy Evaluation Patient Details Name: Julie Sosa MRN: [id number] DOB: 02/04/1936 Today's Date: 06/23/2016   History of Present Illness  pt is a 81 yo female s/p R THR, posterior approach (06/23/16) WBAT. Underwent L THR on 12/24/14   Clinical Impression  Pt awake, alert and willing to participate in PT evaluation. Complained of moderate pain in R hip but was tolerable and received pain meds prior to session. Pt's overall UE and L LE strength appears WFLs, presents w/ decreased R LE strength grossly at least 3+/5 for tasks assessed and able to perform supine therex. Educated patient on HEP and posterior hip precautions. Pt able to transfer and ambulate to chair w/ use of RW and min guarding from PT; she demonstrated good safe technique during mobility and able to maintain posterior hip precautions throughout. Pt is independent at baseline, lives with her husband who is available 24/7, and is equipped w/ RW, BSC (and wc if needed). Pt overall is limited in functional mobility due decreased strength, ROM and increased pain, she will benefit from skilled PT to correct deficits and return to prior level of function. Recommend HHPT following acute hospital stay.     Follow Up Recommendations Home health PT    Equipment Recommendations   (pt equiped with DME from previous THR)    Recommendations for Other Services       Precautions / Restrictions Precautions Precautions: Posterior Hip Precaution Booklet Issued: Yes (comment) Precaution Comments: no hip add or IR past neutral, no hip flexion beyone 90 degrees Restrictions Weight Bearing Restrictions: Yes RLE Weight Bearing: Weight bearing as tolerated      Mobility  Bed Mobility Overal bed mobility: Needs Assistance Bed Mobility: Supine to Sit     Supine to sit: HOB elevated;Min assist     General bed mobility comments: guarding of the R LE to maintain hip precuations, min assist to bring LE's over EOB and advance  trunk into upright position  Transfers Overall transfer level: Needs assistance Equipment used: Rolling walker (2 wheeled) Transfers: Sit to/from Stand Sit to Stand: Supervision         General transfer comment: demonstrated good safe technique, increased time and leans to Left to offload R LE when transferring to RW  Ambulation/Gait Ambulation/Gait assistance: Min guard Ambulation Distance (Feet): 3 Feet Assistive device: Rolling walker (2 wheeled) Gait Pattern/deviations: Step-to pattern;Decreased step length - right;Decreased step length - left;Antalgic   Gait velocity interpretation: Below normal speed for age/gender General Gait Details: pt able to ambulate safely to chair using a step to gait pattern, no LOB or staggering, requires use of RW for improved stability  Stairs            Wheelchair Mobility    Modified Rankin (Stroke Patients Only)       Balance Overall balance assessment: Needs assistance Sitting-balance support: Feet supported;Single extremity supported Sitting balance-Leahy Scale: Good Sitting balance - Comments: able to maintain sitting posture w/o back support   Standing balance support: Bilateral upper extremity supported Standing balance-Leahy Scale: Fair Standing balance comment: requires RW in standing for improved stability                              Pertinent Vitals/Pain Pain Assessment: 0-10 Pain Score: 7  Pain Location: R hip  Pain Descriptors / Indicators: Aching Pain Intervention(s): Monitored during session;Premedicated before session;Limited activity within patient's tolerance    Home Living Family/patient expects to be  discharged to:: Private residence Living Arrangements: Spouse/significant other Available Help at Discharge: Family;Available 24 hours/day Type of Home: House Home Access: Stairs to enter Entrance Stairs-Rails: None Entrance Stairs-Number of Steps: 2 Home Layout: One level;Laundry or work  area in [place]: Environmental consultant - 2 wheels;Wheelchair - manual;Bedside commode      Prior Function Level of Independence: Independent         Comments: independent in ADLs and IADLs, does household chores such as IT consultant        Extremity/Trunk Assessment   Upper Extremity Assessment Upper Extremity Assessment: Overall WFL for tasks assessed    Lower Extremity Assessment Lower Extremity Assessment: RLE deficits/detail RLE Deficits / Details: decreased strength and AROM of hip (unable to fully assess ROM due to hip precautions), strength at least 3/5    Cervical / Trunk Assessment Cervical / Trunk Assessment: Normal  Communication   Communication: No difficulties  Cognition Arousal/Alertness: Awake/alert Behavior During Therapy: WFL for tasks assessed/performed Overall Cognitive Status: Within Functional Limits for tasks assessed                      General Comments      Exercises Total Joint Exercises Ankle Circles/Pumps: AROM;Strengthening;Both;10 reps;Supine Quad Sets: AROM;Strengthening;Right;10 reps;Supine Gluteal Sets: AROM;Strengthening;Right;10 reps;Supine Heel Slides: AROM;Strengthening;Right;10 reps;Supine Hip ABduction/ADduction: AROM;Strengthening;Right;10 reps;Supine Straight Leg Raises: AROM;Strengthening;Right;10 reps;Supine   Assessment/Plan    PT Assessment Patient needs continued PT services  PT Problem List Decreased strength;Decreased range of motion;Decreased balance;Decreased mobility;Decreased knowledge of use of DME;Decreased knowledge of precautions;Pain       PT Treatment Interventions DME instruction;Gait training;Stair training;Functional mobility training;Balance training;Therapeutic exercise;Therapeutic activities;Patient/family education    PT Goals (Current goals can be found in the Care Plan section)  Acute Rehab PT Goals Patient Stated Goal: Return Home and improve pain PT Goal  Formulation: With patient/family Time For Goal Achievement: 07/07/16 Potential to Achieve Goals: Good    Frequency BID   Barriers to discharge        Co-evaluation               End of Session Equipment Utilized During Treatment: Gait belt Activity Tolerance: Patient tolerated treatment well Patient left: in chair;with call bell/phone within reach;with chair alarm set;with family/visitor present Nurse Communication: Mobility status PT Visit Diagnosis: Muscle weakness (generalized) (M62.81);Unsteadiness on feet (R26.81);Pain Pain - Right/Left: Right Pain - part of body: Hip         Time: 2637-8588 PT Time Calculation (min) (ACUTE ONLY): 31 min   Charges:         PT G Codes:         [employer] Student PT 06/23/2016, 4:14 PM

## 2016-06-23 NOTE — Op Note (Signed)
OPERATIVE NOTE  DATE OF SURGERY:  06/23/2016  PATIENT NAME:  Julie Sosa   DOB: 17-Jun-1935  MRN: [id number]  PRE-OPERATIVE DIAGNOSIS: Degenerative arthrosis of the right hip, primary  POST-OPERATIVE DIAGNOSIS:  Same  PROCEDURE:  Right total hip arthroplasty  SURGEON:  Marciano Sequin. M.D.  ASSISTANT:  Vance Peper, PA (present and scrubbed throughout the case, critical for assistance with exposure, retraction, instrumentation, and closure)  ANESTHESIA: spinal  ESTIMATED BLOOD LOSS: 200 mL  FLUIDS REPLACED: 1000 mL of crystalloid  DRAINS: 2 medium drains to a Hemovac reservoir  IMPLANTS UTILIZED: DePuy 12 mm small stature AML femoral stem, 50 mm OD Pinnacle 100 acetabular component, +4 mm neutral Pinnacle Marathon polyethylene insert, and a 32 mm CoCr +1 mm hip ball  INDICATIONS FOR SURGERY: Julie Sosa is a 81 y.o. year old female with a long history of progressive hip and groin  pain. X-rays demonstrated severe degenerative changes. The patient had not seen any significant improvement despite conservative nonsurgical intervention. After discussion of the risks and benefits of surgical intervention, the patient expressed understanding of the risks benefits and agree with plans for total hip arthroplasty.   The risks, benefits, and alternatives were discussed at length including but not limited to the risks of infection, bleeding, nerve injury, stiffness, blood clots, the need for revision surgery, limb length inequality, dislocation, cardiopulmonary complications, among others, and they were willing to proceed.  PROCEDURE IN DETAIL: The patient was brought into the operating room and, after adequate spinal anesthesia was achieved, the patient was placed in a left lateral decubitus position. Axillary roll was placed and all bony prominences were well-padded. The patient's right hip was cleaned and prepped with alcohol and DuraPrep and draped in the usual sterile fashion. A  "timeout" was performed as per usual protocol. A lateral curvilinear incision was made gently curving towards the posterior superior iliac spine. The IT band was incised in line with the skin incision and the fibers of the gluteus maximus were split in line. The piriformis tendon was identified, skeletonized, and incised at its insertion to the proximal femur and reflected posteriorly. A T type posterior capsulotomy was performed. Prior to dislocation of the femoral head, a threaded Steinmann pin was inserted through a separate stab incision into the pelvis superior to the acetabulum and bent in the form of a stylus so as to assess limb length and hip offset throughout the procedure. The femoral head was then dislocated posteriorly. Inspection of the femoral head demonstrated severe degenerative changes with full-thickness loss of articular cartilage. The femoral neck cut was performed using an oscillating saw. The anterior capsule was elevated off of the femoral neck using a periosteal elevator. Attention was then directed to the acetabulum. The remnant of the labrum was excised using electrocautery. Inspection of the acetabulum also demonstrated significant degenerative changes. The acetabulum was reamed in sequential fashion up to a 49 mm diameter. Good punctate bleeding bone was encountered. A 50 mm Pinnacle 100 acetabular component was positioned and impacted into place. Good scratch fit was appreciated. A +4 mm neutral polyethylene trial was inserted.  Attention was then directed to the proximal femur. A hole for reaming of the proximal femoral canal was created using a high-speed burr. The femoral canal was reamed in sequential fashion up to a 11.5 mm diameter. This allowed for approximately 6.5 cm of scratch fit. It was thus elected to ream to a 12 mm diameter to allow for a line to line fit.  Serial broaches were inserted up to a 12 mm small stature femoral broach. Calcar region was planed and a trial  reduction was performed using a 32 mm hip ball with a +1 mm neck length. Good equalization of limb lengths and hip offset was appreciated and excellent stability was noted both anteriorly and posteriorly. Trial components were removed. The acetabular shell was irrigated with copious amounts of normal saline with antibiotic solution and suctioned dry. A +4 mm neutral Pinnacle Marathon polyethylene insert was positioned and impacted into place. Next, a 12 mm small stature AML femoral stem was positioned and impacted into place. Excellent scratch fit was appreciated. A trial reduction was again performed with a 32 mm hip ball with a +1 mm neck length. Again, good equalization of limb lengths was appreciated and excellent stability appreciated both anteriorly and posteriorly. The hip was then dislocated and the trial hip ball was removed. The Morse taper was cleaned and dried. A 32 mm cobalt chrome hip ball with a +1 mm neck length was placed on the trunnion and impacted into place. The hip was then reduced and placed through range of motion. Excellent stability was appreciated both anteriorly and posteriorly.  The wound was irrigated with copious amounts of normal saline with antibiotic solution and suctioned dry. Good hemostasis was appreciated. The posterior capsulotomy was repaired using #5 Ethibond. Piriformis tendon was reapproximated to the undersurface of the gluteus medius tendon using #5 Ethibond. Two medium drains were placed in the wound bed and brought out through separate stab incisions to be attached to a Hemovac reservoir. The IT band was reapproximated using interrupted sutures of #1 Vicryl. Subcutaneous tissue was approximated using first #0 Vicryl followed by #2-0 Vicryl. The skin was closed with skin staples.  The patient tolerated the procedure well and was transported to the recovery room in stable condition.   Marciano Sequin., M.D.

## 2016-06-23 NOTE — Anesthesia Procedure Notes (Signed)
Spinal  Patient location during procedure: OR Start time: 06/23/2016 7:25 AM End time: 06/23/2016 7:33 AM Staffing Resident/CRNA: Nelda Marseille Performed: resident/CRNA  Preanesthetic Checklist Completed: patient identified, site marked, surgical consent, pre-op evaluation, timeout performed, IV checked, risks and benefits discussed and monitors and equipment checked Spinal Block Patient position: sitting Prep: Betadine Patient monitoring: heart rate, continuous pulse ox, blood pressure and cardiac monitor Approach: midline Location: L3-4 Injection technique: single-shot Needle Needle type: Whitacre and Introducer  Needle gauge: 25 G Needle length: 9 cm Assessment Sensory level: T10 Additional Notes Negative paresthesia. Negative blood return. Positive free-flowing CSF. Expiration date of kit checked and confirmed. Patient tolerated procedure well, without complications.

## 2016-06-23 NOTE — Anesthesia Post-op Follow-up Note (Cosign Needed)
Anesthesia QCDR form completed.        

## 2016-06-23 NOTE — Anesthesia Preprocedure Evaluation (Signed)
Anesthesia Evaluation  Patient identified by MRN, date of birth, ID band Patient awake    Reviewed: Allergy & Precautions, NPO status , Patient's Chart, lab work & pertinent test results  History of Anesthesia Complications Negative for: history of anesthetic complications  Airway Mallampati: II       Dental   Pulmonary neg pulmonary ROS,           Cardiovascular hypertension, Pt. on medications and Pt. on home beta blockers      Neuro/Psych Anxiety Depression negative neurological ROS     GI/Hepatic Neg liver ROS, GERD  ,  Endo/Other  negative endocrine ROS  Renal/GU negative Renal ROS     Musculoskeletal  (+) Arthritis , Osteoarthritis,    Abdominal   Peds  Hematology negative hematology ROS (+)   Anesthesia Other Findings   Reproductive/Obstetrics                             Anesthesia Physical Anesthesia Plan  ASA: II  Anesthesia Plan: Spinal   Post-op Pain Management:    Induction: Intravenous  Airway Management Planned:   Additional Equipment:   Intra-op Plan:   Post-operative Plan:   Informed Consent: I have reviewed the patients History and Physical, chart, labs and discussed the procedure including the risks, benefits and alternatives for the proposed anesthesia with the patient or authorized representative who has indicated his/her understanding and acceptance.     Plan Discussed with:   Anesthesia Plan Comments:         Anesthesia Quick Evaluation

## 2016-06-24 LAB — BASIC METABOLIC PANEL
Anion gap: 5 (ref 5–15)
BUN: 10 mg/dL (ref 6–20)
CHLORIDE: 100 mmol/L — AB (ref 101–111)
CO2: 28 mmol/L (ref 22–32)
CREATININE: 0.74 mg/dL (ref 0.44–1.00)
Calcium: 7.2 mg/dL — ABNORMAL LOW (ref 8.9–10.3)
GFR calc Af Amer: >60 mL/min (ref >60)
GFR calc non Af Amer: >60 mL/min (ref >60)
GLUCOSE: 108 mg/dL — AB (ref 65–99)
POTASSIUM: 3.3 mmol/L — AB (ref 3.5–5.1)
SODIUM: 133 mmol/L — AB (ref 135–145)

## 2016-06-24 LAB — CBC
HCT: 27.4 % — ABNORMAL LOW (ref 35.0–47.0)
HEMOGLOBIN: 9.6 g/dL — AB (ref 12.0–16.0)
MCH: 30.4 pg (ref 26.0–34.0)
MCHC: 35 g/dL (ref 32.0–36.0)
MCV: 86.7 fL (ref 80.0–100.0)
Platelets: 144 10*3/uL — ABNORMAL LOW (ref 150–440)
RBC: 3.16 MIL/uL — AB (ref 3.80–5.20)
RDW: 13.6 % (ref 11.5–14.5)
WBC: 6.7 10*3/uL (ref 3.6–11.0)

## 2016-06-24 NOTE — Progress Notes (Signed)
Physical Therapy Treatment Patient Details Name: Julie Sosa MRN: [id number] DOB: 14-Oct-1935 Today's Date: 06/24/2016    History of Present Illness pt is a 81 yo female s/p R THR, posterior approach (06/23/16) WBAT. Underwent L THR on 12/24/14     PT Comments    Pt willing to participate in PT treatment w/ pain under control. She demonstrated improved mobility this afternoon and is able to transfer in and out of chair independently w/ RW and PT supervision. Pt ambulated around nursing station again w/ RW and supervision; demonstrated improved gait speed and more symmetrical gait pattern. Pt and spouse educated on ascending and descending 3 steps w/ RW and no rails, min guarding provided for safety, pt demonstrated safe technique after receiving verbal cues and husband able to support RW during stair ambulation, no LOB or buckling of the LEs. Overall patient is progressing and is close to meeting all current PT acute care goals and is safe for household mobility. Still presents w/ decreased R hip strength, balance and increased pain that limit functional mobility, she will continue to benefit from skilled PT to further improve function, recommend HHPT following acute hospital stay.    Follow Up Recommendations  Home health PT     Equipment Recommendations       Recommendations for Other Services       Precautions / Restrictions Precautions Precautions: Posterior Hip;Fall Precaution Booklet Issued: Yes (comment) Precaution Comments: no hip add or IR past neutral, no hip flexion beyone 90 degrees Restrictions Weight Bearing Restrictions: Yes RLE Weight Bearing: Weight bearing as tolerated    Mobility  Bed Mobility               General bed mobility comments: not assessed pateint seated in chair upon entrance  Transfers Overall transfer level: Needs assistance Equipment used: Rolling walker (2 wheeled) Transfers: Sit to/from Stand Sit to Stand: Supervision         General transfer comment: safe technique during transfers w/ RW, more symmetrical WBing in bilat LEs  Ambulation/Gait Ambulation/Gait assistance: Supervision Ambulation Distance (Feet): 200 Feet Assistive device: Rolling walker (2 wheeled) Gait Pattern/deviations: Step-through pattern;Antalgic Gait velocity: improving, good for household ambulation   General Gait Details: symmetrical gait pattern this afternoon w/ improved gait speed using RW, still some pain when WBing on R LE but improving    Stairs Stairs: Yes   Stair Management: No rails;With walker Number of Stairs: 3 General stair comments: received education/demonstration on safe technique, husband present, utilized backwards technique w/ RW step to method had spouse support walker when descending   Wheelchair Mobility    Modified Rankin (Stroke Patients Only)       Balance Overall balance assessment: Needs assistance Sitting-balance support: Feet supported;Single extremity supported Sitting balance-Leahy Scale: Normal Sitting balance - Comments: able to maintain sitting posture w/o back support   Standing balance support: Bilateral upper extremity supported;During functional activity Standing balance-Leahy Scale: Good Standing balance comment: good stable standing posture in static standing, requires RW for ambulation                    Cognition Arousal/Alertness: Awake/alert Behavior During Therapy: WFL for tasks assessed/performed Overall Cognitive Status: Within Functional Limits for tasks assessed                      Exercises      General Comments        Pertinent Vitals/Pain Pain Assessment: 0-10 Pain Score: 5  Pain Location: R hip  Pain Descriptors / Indicators: Aching Pain Intervention(s): Limited activity within patient's tolerance;Monitored during session;Premedicated before session    Home Living Family/patient expects to be discharged to:: Private residence Living  Arrangements: Spouse/significant other Available Help at Discharge: Family;Available 24 hours/day Type of Home: House              Prior Function            PT Goals (current goals can now be found in the care plan section) Acute Rehab PT Goals Patient Stated Goal: Return Home and improve pain PT Goal Formulation: With patient Time For Goal Achievement: 07/07/16 Potential to Achieve Goals: Good Progress towards PT goals: Progressing toward goals    Frequency    BID      PT Plan Current plan remains appropriate    Co-evaluation             End of Session Equipment Utilized During Treatment: Gait belt Activity Tolerance: Patient tolerated treatment well Patient left: in chair;with call bell/phone within reach;with chair alarm set;Other (comment);with family/visitor present (ice pack applied to R hip) Nurse Communication: Mobility status PT Visit Diagnosis: Muscle weakness (generalized) (M62.81);Unsteadiness on feet (R26.81);Pain Pain - Right/Left: Right Pain - part of body: Hip     Time: 1829-9371 PT Time Calculation (min) (ACUTE ONLY): 17 min  Charges:                       G Codes:       [employer] Student PT 06/24/2016, 3:38 PM

## 2016-06-24 NOTE — Progress Notes (Signed)
Physical Therapy Treatment Patient Details Name: Julie Sosa MRN: [id number] DOB: 11-May-1935 Today's Date: 06/24/2016    History of Present Illness pt is a 81 yo female s/p R THR, posterior approach (06/23/16) WBAT. Underwent L THR on 12/24/14     PT Comments    Pt awake and alert stated her R hip feels stiff w/ pain under control (received meds prior to session). She displayed improved mobility this morning and able to transfer OOB w/ min guarding/supervision w/ safe technique and maintained hip precautions throughout mobility. Pt ambulated around nursing station and requires use of RW for additional stability and supervision for safety. Demonstrated improved step through gait pattern during ambulation and is bearing more weight on the R LE, no LOB or staggering noted during ambulation. Pt is progressing but still limited in functional mobility due to decreased R LE strength and increased R hip pain. She will continue to benefit from skilled PT to correct deficits, recommend HHPT following acute hospital stay. Pt stated she would like to go home today if possible, only has to ambulate stairs before meeting all of PT rehab goals (will plan on stairs this afternoon), will consult case manager.       Follow Up Recommendations  Home health PT     Equipment Recommendations       Recommendations for Other Services       Precautions / Restrictions Precautions Precautions: Posterior Hip;Fall Precaution Booklet Issued: Yes (comment) Precaution Comments: no hip add or IR past neutral, no hip flexion beyone 90 degrees Restrictions Weight Bearing Restrictions: Yes RLE Weight Bearing: Weight bearing as tolerated    Mobility  Bed Mobility Overal bed mobility: Needs Assistance Bed Mobility: Supine to Sit     Supine to sit: HOB elevated;Min guard     General bed mobility comments: pt able to safely rotate LE's over EOB w/ increased time and guarding of the R LE to maintain hip  precautions  Transfers Overall transfer level: Needs assistance Equipment used: Rolling walker (2 wheeled) Transfers: Sit to/from Stand Sit to Stand: Supervision         General transfer comment: improved mechanics during transfers uses B UEs to initiate lift off, requires use of RW for improved stability  Ambulation/Gait Ambulation/Gait assistance: Supervision Ambulation Distance (Feet): 200 Feet Assistive device: Rolling walker (2 wheeled) Gait Pattern/deviations: Step-through pattern;Decreased step length - left;Decreased stance time - right;Antalgic Gait velocity: good for household ambulation Gait velocity interpretation: Below normal speed for age/gender General Gait Details: pt ambulated around nursing station w/ RW, able to progress to more symmetrical gait pattern but still takes shorter steps on L LE due to pain on R LE w/ WBing,    Stairs            Wheelchair Mobility    Modified Rankin (Stroke Patients Only)       Balance Overall balance assessment: Needs assistance Sitting-balance support: Feet supported;Single extremity supported Sitting balance-Leahy Scale: Normal Sitting balance - Comments: able to maintain sitting posture w/o back support, able to perform dressing in sitting w/ min assist to prevent excess hip flexion   Standing balance support: Bilateral upper extremity supported;During functional activity Standing balance-Leahy Scale: Good Standing balance comment: good stable standing posture w/ RW, improved WBing in standing                     Cognition Arousal/Alertness: Awake/alert Behavior During Therapy: WFL for tasks assessed/performed Overall Cognitive Status: Within Functional Limits for tasks assessed  Exercises Total Joint Exercises Ankle Circles/Pumps: AROM;Strengthening;Both;10 reps;Supine Gluteal Sets: AROM;Strengthening;Both;10 reps;Supine Heel Slides: AROM;Right;10  reps;Strengthening;Supine Hip ABduction/ADduction: AROM;Strengthening;Right;10 reps;Supine Straight Leg Raises: AROM;Strengthening;Right;10 reps;Supine    General Comments        Pertinent Vitals/Pain Pain Assessment: 0-10 Pain Score: 6  Pain Location: R hip  Pain Descriptors / Indicators: Aching Pain Intervention(s): Monitored during session;Premedicated before session    Home Living                      Prior Function            PT Goals (current goals can now be found in the care plan section) Acute Rehab PT Goals Patient Stated Goal: Return Home and improve pain PT Goal Formulation: With patient Time For Goal Achievement: 07/07/16 Potential to Achieve Goals: Good Progress towards PT goals: Progressing toward goals    Frequency    BID      PT Plan Current plan remains appropriate    Co-evaluation             End of Session Equipment Utilized During Treatment: Gait belt Activity Tolerance: Patient tolerated treatment well Patient left: in chair;with call bell/phone within reach;with chair alarm set;with SCD's reapplied;Other (comment) (Ice applied to R hip) Nurse Communication: Mobility status;Other (comment) (pt wanting to go home today) PT Visit Diagnosis: Muscle weakness (generalized) (M62.81);Unsteadiness on feet (R26.81);Pain Pain - Right/Left: Right Pain - part of body: Hip     Time: 6122-4497 PT Time Calculation (min) (ACUTE ONLY): 34 min  Charges:                       G Codes:       [employer] Student PT 06/24/2016, 9:31 AM

## 2016-06-24 NOTE — Anesthesia Postprocedure Evaluation (Signed)
Anesthesia Post Note  Patient: Julie Sosa  Procedure(s) Performed: Procedure(s) (LRB): TOTAL HIP ARTHROPLASTY (Right)  Patient location during evaluation: Nursing Unit Anesthesia Type: Spinal Level of consciousness: oriented and awake and alert Pain management: pain level controlled Vital Signs Assessment: post-procedure vital signs reviewed and stable Respiratory status: spontaneous breathing, respiratory function stable and patient connected to nasal cannula oxygen Cardiovascular status: blood pressure returned to baseline and stable Postop Assessment: no headache and no backache Anesthetic complications: no     Last Vitals:  Vitals:   06/24/16 0438 06/24/16 0440  BP: (!) 89/63 94/60  Pulse: 62 (!) 59  Resp: 18   Temp: 36.5 C     Last Pain:  Vitals:   06/24/16 0438  TempSrc: Oral  PainSc:                  [name]

## 2016-06-24 NOTE — Progress Notes (Signed)
Clinical Social Worker (CSW) received SNF consult. PT is recommending home health. RN case manager aware of above. Please reconsult if future social work needs arise. CSW signing off.   Catheryn Slifer, LCSW [phone] 

## 2016-06-24 NOTE — Discharge Summary (Signed)
Physician Discharge Summary  Patient ID: Julie Sosa MRN: [id number] DOB/AGE: [birth year] 81 y.o.  Admit date: 06/23/2016 Discharge date: 06/25/2016  Admission Diagnoses:  primary osteoarthritis of right hip   Discharge Diagnoses: Patient Active Problem List   Diagnosis Date Noted  . Degenerative joint disease (DJD) of hip 12/25/2014  . S/P total hip arthroplasty 12/25/2014    Past Medical History:  Diagnosis Date  . Anxiety   . Arthritis    osteoarthritis  . Cancer ([place])    melanoma right arm  . Depression   . GERD (gastroesophageal reflux disease)   . Hypercholesteremia   . Hypertension   . Restless leg syndrome      Transfusion: No transfusions during this admission   Consultants (if any):  none  Discharged Condition: Improved  Hospital Course: Julie Sosa is an 81 y.o. female who was admitted 06/23/2016 with a diagnosis of degenerative arthrosis right hip and went to the operating room on 06/23/2016 and underwent the above named procedures.    Surgeries:Procedure(s): TOTAL HIP ARTHROPLASTY on 06/23/2016  PRE-OPERATIVE DIAGNOSIS: Degenerative arthrosis of the right hip, primary  POST-OPERATIVE DIAGNOSIS:  Same  PROCEDURE:  Right total hip arthroplasty  SURGEON:  Marciano Sequin. M.D.  ASSISTANT:  Vance Peper, PA (present and scrubbed throughout the case, critical for assistance with exposure, retraction, instrumentation, and closure)  ANESTHESIA: spinal  ESTIMATED BLOOD LOSS: 200 mL  FLUIDS REPLACED: 1000 mL of crystalloid  DRAINS: 2 medium drains to a Hemovac reservoir  IMPLANTS UTILIZED: DePuy 12 mm small stature AML femoral stem, 50 mm OD Pinnacle 100 acetabular component, +4 mm neutral Pinnacle Marathon polyethylene insert, and a 32 mm CoCr +1 mm hip ball  INDICATIONS FOR SURGERY: Julie Sosa is a 81 y.o. year old female with a long history of progressive hip and groin  pain. X-rays demonstrated severe degenerative changes.  The patient had not seen any significant improvement despite conservative nonsurgical intervention. After discussion of the risks and benefits of surgical intervention, the patient expressed understanding of the risks benefits and agree with plans for total hip arthroplasty.   The risks, benefits, and alternatives were discussed at length including but not limited to the risks of infection, bleeding, nerve injury, stiffness, blood clots, the need for revision surgery, limb length inequality, dislocation, cardiopulmonary complications, among others, and they were willing to proceed. Patient tolerated the surgery well. No complications .Patient was taken to PACU where she was stabilized and then transferred to the orthopedic floor.  Patient started on Lovenox 30 mg q 12 hrs. Foot pumps applied bilaterally at 80 mm hgb. Heels elevated off bed with rolled towels. No evidence of DVT. Calves non tender. Negative Homan. Physical therapy started on day #1 for gait training and transfer with OT starting on  day #1 for ADL and assisted devices. Patient has done well with therapy. Ambulated greater than 200 feet upon being discharged. Was able to ascend and descend 4 steps safely and independently  Patient's IV and Foley were discontinued on day #1 with Hemovac being discontinued on day #2. Dressing was changed prior to patient being discharged.   She was given perioperative antibiotics:  Anti-infectives    Start     Dose/Rate Route Frequency Ordered Stop   06/23/16 1200  clindamycin (CLEOCIN) IVPB 600 mg     600 mg 100 mL/hr over 30 Minutes Intravenous Every 6 hours 06/23/16 1149 06/24/16 1159   06/23/16 0629  clindamycin (CLEOCIN) 900 MG/50ML IVPB  Comments:  Rexanne Mano: cabinet override      06/23/16 0629 06/23/16 0754   06/22/16 0420  clindamycin (CLEOCIN) IVPB 900 mg  Status:  Discontinued     900 mg 100 mL/hr over 30 Minutes Intravenous On call to O.R. 06/22/16 0420 06/23/16 1149     .  She was fitted with AV 1 compression foot pump devices, instructed on heel pumps, early ambulation, and TED stockings bilaterally for DVT prophylaxis.  She benefited maximally from the hospital stay and there were no complications.    Recent vital signs:  Vitals:   06/24/16 0440 06/24/16 0722  BP: 94/60 96/72  Pulse: (!) 59 63  Resp:  18  Temp:  97.7 F (36.5 C)    Recent laboratory studies:  Lab Results  Component Value Date   HGB 9.6 (L) 06/24/2016   HGB 11.6 (L) 06/23/2016   HGB 12.9 06/16/2016   Lab Results  Component Value Date   WBC 6.7 06/24/2016   PLT 144 (L) 06/24/2016   Lab Results  Component Value Date   INR 0.98 06/16/2016   Lab Results  Component Value Date   NA 133 (L) 06/24/2016   K 3.3 (L) 06/24/2016   CL 100 (L) 06/24/2016   CO2 28 06/24/2016   BUN 10 06/24/2016   CREATININE 0.74 06/24/2016   GLUCOSE 108 (H) 06/24/2016    Discharge Medications:   Allergies as of 06/25/2016      Reactions   Cefuroxime Rash      Medication List    TAKE these medications   atenolol 50 MG tablet Commonly known as:  TENORMIN Take 50 mg by mouth daily. In am.   doxycycline 100 MG capsule Commonly known as:  VIBRAMYCIN Take 100 mg by mouth 2 (two) times daily.   enoxaparin 40 MG/0.4ML injection Commonly known as:  LOVENOX Inject 0.4 mLs (40 mg total) into the skin daily.   hydrochlorothiazide 25 MG tablet Commonly known as:  HYDRODIURIL Take 25 mg by mouth daily. In am.   oxyCODONE 5 MG immediate release tablet Commonly known as:  Oxy IR/ROXICODONE Take 1-2 tablets (5-10 mg total) by mouth every 4 (four) hours as needed for severe pain.   potassium chloride SA 20 MEQ tablet Commonly known as:  K-DUR,KLOR-CON Take 20-40 mEq by mouth 2 (two) times daily. 40 MEQ in the morning & 20 MEQ at night after supper   REFRESH 1.4-0.6 % ophthalmic solution Generic drug:  polyvinyl alcohol-povidone Place 1-2 drops into both eyes daily as needed. Dry Eyes    rOPINIRole 1 MG tablet Commonly known as:  REQUIP Take 1 mg by mouth at bedtime.   sertraline 100 MG tablet Commonly known as:  ZOLOFT Take 100 mg by mouth daily. In am.   simvastatin 20 MG tablet Commonly known as:  ZOCOR Take 20 mg by mouth daily. In am.   traMADol 50 MG tablet Commonly known as:  ULTRAM Take 1-2 tablets (50-100 mg total) by mouth every 4 (four) hours as needed for moderate pain.   traZODone 100 MG tablet Commonly known as:  DESYREL Take 100 mg by mouth at bedtime as needed.            Durable Medical Equipment        Start     Ordered   06/23/16 1150  DME Walker rolling  Once    Question:  Patient needs a walker to treat with the following condition  Answer:  S/P total hip arthroplasty  06/23/16 1149   06/23/16 1150  DME Bedside commode  Once    Question:  Patient needs a bedside commode to treat with the following condition  Answer:  S/P total hip arthroplasty   06/23/16 1149      Diagnostic Studies: Dg Hip Port Unilat With Pelvis 1v Right  Result Date: 06/23/2016 CLINICAL DATA:  Postop total hip replacement. EXAM: DG HIP (WITH OR WITHOUT PELVIS) 1V PORT RIGHT COMPARISON:  12/25/2014 FINDINGS: Interval right hip replacement. Normal alignment. No hardware or bony complicating feature. Soft tissue drain in place. Remote changes of left hip replacement. IMPRESSION: Interval right hip replacement without complicating feature. Electronically Signed   By: Rolm Baptise M.D.   On: 06/23/2016 11:27    Disposition: 01-Home or Self Care    Follow-up Information    Dereck Leep, MD On 08/05/2016.   Specialty:  Orthopedic Surgery Why:  at 9:45am Contact information: [place] [address] [phone]            Signed: Watt Climes 06/24/2016, 7:54 AM

## 2016-06-24 NOTE — Discharge Instructions (Signed)

## 2016-06-24 NOTE — Progress Notes (Signed)
Patient is A&O x4. UP with assist x1 and [employer]. POD #1, dressing to right right is intact. Hemovac in place. Urinating without difficultly. LBM this shift. Oral pain meds, with good relief. Bed alarm on for safety. Tolerating a regular diet.

## 2016-06-24 NOTE — Progress Notes (Signed)
ORTHOPAEDICS PROGRESS NOTE  PATIENT NAME: Julie Sosa DOB: Sep 24, 1935  MRN: [id number]  POD # 1: Right total hip arthroplasty  Subjective: The patient states that she feels "stiff" but states that the pain has been well-controlled. No nausea or vomiting.  Objective: Vital signs in last 24 hours: Temp:  [97 F (36.1 C)-99.1 F (37.3 C)] 97.7 F (36.5 C) (03/20 0438) Pulse Rate:  [47-64] 59 (03/20 0440) Resp:  [12-20] 18 (03/20 0438) BP: (89-128)/(55-65) 94/60 (03/20 0440) SpO2:  [92 %-100 %] 94 % (03/20 0438) FiO2 (%):  [21 %] 21 % (03/19 1150) Weight:  [79.5 kg (175 lb 3.2 oz)] 79.5 kg (175 lb 3.2 oz) (03/19 1245)  Intake/Output from previous day: 03/19 0701 - 03/20 0700 In: 1680 [P.O.:480; I.V.:1200] Out: 1275 [Urine:845; Drains:230; Blood:200]   Recent Labs  06/23/16 0627 06/24/16 0438  WBC  --  6.7  HGB 11.6* 9.6*  HCT 34.0* 27.4*  PLT  --  144*  K 3.8 3.3*  CL  --  100*  CO2  --  28  BUN  --  10  CREATININE  --  0.74  GLUCOSE 117* 108*  CALCIUM  --  7.2*    EXAM General: Well-developed well-nourished female seen in no acute distress Lungs: clear to auscultation Cardiac: normal rate and regular rhythm Right lower extremity: Right hip dressing is clean and dry. No significant ecchymosis or swelling to the right thigh. Homans test is negative. Neurologic: Awake, alert, oriented. Sensory motor function are grossly intact.  Assessment: Right total hip arthroplasty  Secondary diagnoses: Acute blood loss anemia secondary to surgery superimposed on chronic anemia Restless leg syndrome Hypertension Hyperlipidemia Gastroesophageal reflux disease Depression Anxiety  Plan: Today's goal were reviewed with the patient.  Continue physical therapy and occupational therapy as per total hip arthroplasty rehabilitation protocol. Plan is to go Home after hospital stay. DVT Prophylaxis - Lovenox, Foot Pumps and TED hose  Shareece Bultman P. Holley Bouche M.D.

## 2016-06-24 NOTE — Care Management Note (Signed)
Case Management Note  Patient Details  Name: LORAN AUGUSTE MRN: [id number] Date of Birth: 1935/06/16  Subjective/Objective:    POD # 1 right hip arthroplasty. Met with patient at bedside. She lives at home with her husband and is normally independent. Discussed home health PT and she prefers to use [facility]. Referral to [facility] with [facility]. Pharmacy: [facility] [place]  [phone]. Hulen Skains Lovenox 40 mg # 14 no refills. Patient has a walker, cane, bsc and wheelchair. PCP is. Dr. Willey Blade               Action/Plan:   Expected Discharge Date:  06/25/16               Expected Discharge Plan:  [place]  In-House Referral:     Discharge planning Services  CM Consult  Post Acute Care Choice:  Home Health Choice offered to:  Patient, Spouse  DME Arranged:    DME Agency:     HH Arranged:  PT [place]:  [place]  Status of Service:  In process, will continue to follow  If discussed at Long Length of Stay Meetings, dates discussed:    Additional Comments:  Jolly Mango, RN 06/24/2016, 10:15 AM

## 2016-06-25 LAB — BASIC METABOLIC PANEL
Anion gap: 7 (ref 5–15)
BUN: 13 mg/dL (ref 6–20)
CALCIUM: 7.7 mg/dL — AB (ref 8.9–10.3)
CO2: 27 mmol/L (ref 22–32)
Chloride: 99 mmol/L — ABNORMAL LOW (ref 101–111)
Creatinine, Ser: 0.99 mg/dL (ref 0.44–1.00)
GFR calc Af Amer: >60 mL/min (ref >60)
GFR calc non Af Amer: 53 mL/min — ABNORMAL LOW (ref >60)
GLUCOSE: 115 mg/dL — AB (ref 65–99)
Potassium: 3.3 mmol/L — ABNORMAL LOW (ref 3.5–5.1)
Sodium: 133 mmol/L — ABNORMAL LOW (ref 135–145)

## 2016-06-25 LAB — CBC
HCT: 28.6 % — ABNORMAL LOW (ref 35.0–47.0)
Hemoglobin: 10 g/dL — ABNORMAL LOW (ref 12.0–16.0)
MCH: 30.3 pg (ref 26.0–34.0)
MCHC: 35.1 g/dL (ref 32.0–36.0)
MCV: 86.5 fL (ref 80.0–100.0)
PLATELETS: 148 10*3/uL — AB (ref 150–440)
RBC: 3.31 MIL/uL — ABNORMAL LOW (ref 3.80–5.20)
RDW: 13.6 % (ref 11.5–14.5)
WBC: 7.5 10*3/uL (ref 3.6–11.0)

## 2016-06-25 LAB — SURGICAL PATHOLOGY

## 2016-06-25 MED ORDER — TRAMADOL HCL 50 MG PO TABS: 50.0000 mg | ORAL_TABLET | ORAL | 0 refills | Status: DC | PRN

## 2016-06-25 MED ORDER — ENOXAPARIN SODIUM 40 MG/0.4ML ~~LOC~~ SOLN: 40.0000 mg | SUBCUTANEOUS | 0 refills | Status: DC

## 2016-06-25 MED ORDER — OXYCODONE HCL 5 MG PO TABS: 5.0000 mg | ORAL_TABLET | ORAL | 0 refills | Status: DC | PRN

## 2016-06-25 NOTE — Progress Notes (Signed)
Physical Therapy Treatment Patient Details Name: Julie Sosa MRN: [id number] DOB: 1935-07-30 Today's Date: 06/25/2016    History of Present Illness pt is a 81 yo female s/p R THR, posterior approach (06/23/16) WBAT. Underwent L THR on 12/24/14     PT Comments    Pt awake and alert and eager to head home today. Educated patient on HEP in regards to frequency and technique also educated on proper safe car transfers, pt's husband present for session. Progressed to  standing therex this morning  She was able to ambulate around nursing station w/ RW and supervision, demonstrated good symmetrical gait pattern and safe use of RW w/o buckling or instability. Pt progressing and is safe for household mobility tasks, still presents with decreased balance, strength and ROM of R LE, and increased pain that will improve with continued skilled PT to allow better function. Recommend HHPT upon discharge.    Follow Up Recommendations  Home health PT     Equipment Recommendations       Recommendations for Other Services       Precautions / Restrictions Precautions Precautions: Posterior Hip;Fall Precaution Booklet Issued: Yes (comment) Precaution Comments: no hip add or IR past neutral, no hip flexion beyone 90 degrees Restrictions Weight Bearing Restrictions: Yes RLE Weight Bearing: Weight bearing as tolerated    Mobility  Bed Mobility               General bed mobility comments: not assessed pateint seated in chair upon entrance  Transfers Overall transfer level: Needs assistance Equipment used: Rolling walker (2 wheeled) Transfers: Sit to/from Stand Sit to Stand: Supervision         General transfer comment: safe technique during transfers w/ RW, more symmetrical WBing in bilat LEs  Ambulation/Gait Ambulation/Gait assistance: Supervision Ambulation Distance (Feet): 200 Feet Assistive device: Rolling walker (2 wheeled) Gait Pattern/deviations: Step-through  pattern;Antalgic Gait velocity: improving, good for household ambulation   General Gait Details: improved symmetrical gait pattern, cuing for upright posture during ambulation   Stairs            Wheelchair Mobility    Modified Rankin (Stroke Patients Only)       Balance Overall balance assessment: Needs assistance Sitting-balance support: Feet supported;Single extremity supported Sitting balance-Leahy Scale: Normal Sitting balance - Comments: able to maintain sitting posture w/o back support   Standing balance support: Bilateral upper extremity supported;During functional activity Standing balance-Leahy Scale: Good Standing balance comment: good stable standing posture in static standing, requires RW for ambulation                    Cognition Arousal/Alertness: Awake/alert Behavior During Therapy: WFL for tasks assessed/performed Overall Cognitive Status: Within Functional Limits for tasks assessed                      Exercises General Exercises - Lower Extremity Hip ABduction/ADduction: AROM;Strengthening;Both;10 reps;Seated Heel Raises: AROM;Strengthening;Both;10 reps;Standing Mini-Sqauts: AROM;Both;Strengthening;10 reps;Supine;Standing    General Comments        Pertinent Vitals/Pain Pain Assessment: 0-10 Pain Score: 3  Pain Descriptors / Indicators: Aching Pain Intervention(s): Premedicated before session;Monitored during session;Limited activity within patient's tolerance    Home Living Family/patient expects to be discharged to:: Private residence Living Arrangements: Spouse/significant other Available Help at Discharge: Family;Available 24 hours/day Type of Home: House Home Access: Stairs to enter Entrance Stairs-Rails: None Home Layout: One level;Laundry or work area in basement        Prior Function  PT Goals (current goals can now be found in the care plan section) Acute Rehab PT Goals Patient Stated Goal:  Return Home and improve pain PT Goal Formulation: With patient Time For Goal Achievement: 07/07/16 Potential to Achieve Goals: Good Progress towards PT goals: Progressing toward goals    Frequency    BID      PT Plan Current plan remains appropriate    Co-evaluation             End of Session Equipment Utilized During Treatment: Gait belt Activity Tolerance: Patient tolerated treatment well Patient left: in chair;with call bell/phone within reach;with family/visitor present Nurse Communication: Mobility status PT Visit Diagnosis: Muscle weakness (generalized) (M62.81);Unsteadiness on feet (R26.81);Pain Pain - Right/Left: Right Pain - part of body: Hip     Time: 1275-1700 PT Time Calculation (min) (ACUTE ONLY): 18 min  Charges:                       G Codes:       [employer] Student PT 06/25/2016, 10:07 AM

## 2016-06-25 NOTE — Plan of Care (Signed)
Problem: Skin Integrity: Goal: Signs of wound healing will improve Outcome: Completed/Met Date Met: 06/25/16 Pt has met all goals for discharge.

## 2016-06-25 NOTE — Care Management Note (Signed)
Case Management Note  Patient Details  Name: Julie Sosa MRN: [id number] Date of Birth: 08-Apr-1935  Subjective/Objective:  Discharging today.                 Action/Plan: Cost of Lovenox is $81.89. [facility] notified of discharge. Patient agreeable to POC.   Expected Discharge Date:  06/25/16               Expected Discharge Plan:  [place]  In-House Referral:     Discharge planning Services  CM Consult  Post Acute Care Choice:  Home Health Choice offered to:  Patient, Spouse  DME Arranged:    DME Agency:     HH Arranged:  PT [place]:  [place]  Status of Service:  Completed, signed off  If discussed at [place] of Stay Meetings, dates discussed:    Additional Comments:  Jolly Mango, RN 06/25/2016, 9:13 AM

## 2016-06-25 NOTE — Progress Notes (Signed)
   Subjective: 2 Days Post-Op Procedure(s) (LRB): TOTAL HIP ARTHROPLASTY (Right) Patient reports pain as mild.   Patient is well, and has had no acute complaints or problems Did well with physical therapy yesterday  Plan is to go Home after hospital stay. no nausea and no vomiting Patient denies any chest pains or shortness of breath. Objective: Vital signs in last 24 hours: Temp:  [97.7 F (36.5 C)-99.4 F (37.4 C)] 99.4 F (37.4 C) (03/20 2338) Pulse Rate:  [63-78] 78 (03/20 2338) Resp:  [18] 18 (03/20 2338) BP: (91-114)/(52-72) 91/54 (03/20 2338) SpO2:  [94 %-100 %] 94 % (03/20 2338) well approximated incision Heels are non tender and elevated off the bed using rolled towels Intake/Output from previous day: 03/20 0701 - 03/21 0700 In: 800 [P.O.:800] Out: 291 [Urine:280; Drains:10; Stool:1] Intake/Output this shift: Total I/O In: -  Out: 10 [Drains:10]   Recent Labs  06/23/16 0627 06/24/16 0438 06/25/16 0548  HGB 11.6* 9.6* 10.0*    Recent Labs  06/24/16 0438 06/25/16 0548  WBC 6.7 7.5  RBC 3.16* 3.31*  HCT 27.4* 28.6*  PLT 144* 148*    Recent Labs  06/23/16 0627 06/24/16 0438  NA 139 133*  K 3.8 3.3*  CL  --  100*  CO2  --  28  BUN  --  10  CREATININE  --  0.74  GLUCOSE 117* 108*  CALCIUM  --  7.2*   No results for input(s): LABPT, INR in the last 72 hours.  EXAM General - Patient is Alert, Appropriate and Oriented Extremity - Neurologically intact Neurovascular intact Sensation intact distally Intact pulses distally Dorsiflexion/Plantar flexion intact No cellulitis present Compartment soft Dressing - scant drainage Motor Function - intact, moving foot and toes well on exam.    Past Medical History:  Diagnosis Date  . Anxiety   . Arthritis    osteoarthritis  . Cancer ([place])    melanoma right arm  . Depression   . GERD (gastroesophageal reflux disease)   . Hypercholesteremia   . Hypertension   . Restless leg syndrome      Assessment/Plan: 2 Days Post-Op Procedure(s) (LRB): TOTAL HIP ARTHROPLASTY (Right) Active Problems:   S/P total hip arthroplasty  Estimated body mass index is 31.04 kg/m as calculated from the following:   Height as of this encounter: 5\' 3"  (1.6 m).   Weight as of this encounter: 79.5 kg (175 lb 3.2 oz). Up with therapy Discharge home with home health  Labs: Were reviewed. Hemoglobin up to 10. DVT Prophylaxis - Lovenox, Foot Pumps and TED hose Weight-Bearing as tolerated to right leg Hemovac discontinued on today's visit Please change dressing to right hip prior to patient being discharged today. Also please give the patient 2 extra honeycomb dressings to take home.  Jillyn Ledger. [place] 06/25/2016, 6:53 AM

## 2016-06-25 NOTE — Progress Notes (Signed)
Shift assessment completed at 0730. Pt oob to chair at that time, alert and oriented x4. Pt c/o pain to her R hip ,received pain meds with am meds. Pt worked with physical therapy and was cleared for discharge. This Probation officer changed honeycomb dressing to pt's r posterior hip, pt tolerated well, iv site was dc'd with catheter intact. Pt was given scripts for tramadol and oxycodone, and d/c instructions were briefly reviewed with pt and her husband. Pt was escorted to front entrance of facility via wc at approx 1100.

## 2016-06-26 DIAGNOSIS — Z471 Aftercare following joint replacement surgery: Secondary | ICD-10-CM | POA: Diagnosis not present

## 2016-06-26 DIAGNOSIS — Z85820 Personal history of malignant melanoma of skin: Secondary | ICD-10-CM | POA: Diagnosis not present

## 2016-06-26 DIAGNOSIS — G2581 Restless legs syndrome: Secondary | ICD-10-CM | POA: Diagnosis not present

## 2016-06-26 DIAGNOSIS — F419 Anxiety disorder, unspecified: Secondary | ICD-10-CM | POA: Diagnosis not present

## 2016-06-26 DIAGNOSIS — Z96641 Presence of right artificial hip joint: Secondary | ICD-10-CM | POA: Diagnosis not present

## 2016-06-26 DIAGNOSIS — F329 Major depressive disorder, single episode, unspecified: Secondary | ICD-10-CM | POA: Diagnosis not present

## 2016-06-26 DIAGNOSIS — I10 Essential (primary) hypertension: Secondary | ICD-10-CM | POA: Diagnosis not present

## 2016-06-26 DIAGNOSIS — E7800 Pure hypercholesterolemia, unspecified: Secondary | ICD-10-CM | POA: Diagnosis not present

## 2016-07-07 DIAGNOSIS — G2581 Restless legs syndrome: Secondary | ICD-10-CM | POA: Diagnosis not present

## 2016-07-07 DIAGNOSIS — F329 Major depressive disorder, single episode, unspecified: Secondary | ICD-10-CM | POA: Diagnosis not present

## 2016-07-07 DIAGNOSIS — E7800 Pure hypercholesterolemia, unspecified: Secondary | ICD-10-CM | POA: Diagnosis not present

## 2016-07-07 DIAGNOSIS — Z85820 Personal history of malignant melanoma of skin: Secondary | ICD-10-CM | POA: Diagnosis not present

## 2016-07-07 DIAGNOSIS — Z471 Aftercare following joint replacement surgery: Secondary | ICD-10-CM | POA: Diagnosis not present

## 2016-07-07 DIAGNOSIS — I10 Essential (primary) hypertension: Secondary | ICD-10-CM | POA: Diagnosis not present

## 2016-07-07 DIAGNOSIS — Z96641 Presence of right artificial hip joint: Secondary | ICD-10-CM | POA: Diagnosis not present

## 2016-07-07 DIAGNOSIS — F419 Anxiety disorder, unspecified: Secondary | ICD-10-CM | POA: Diagnosis not present

## 2016-07-14 DIAGNOSIS — F419 Anxiety disorder, unspecified: Secondary | ICD-10-CM | POA: Diagnosis not present

## 2016-07-14 DIAGNOSIS — Z471 Aftercare following joint replacement surgery: Secondary | ICD-10-CM | POA: Diagnosis not present

## 2016-07-14 DIAGNOSIS — F329 Major depressive disorder, single episode, unspecified: Secondary | ICD-10-CM | POA: Diagnosis not present

## 2016-07-14 DIAGNOSIS — Z96641 Presence of right artificial hip joint: Secondary | ICD-10-CM | POA: Diagnosis not present

## 2016-07-14 DIAGNOSIS — I10 Essential (primary) hypertension: Secondary | ICD-10-CM | POA: Diagnosis not present

## 2016-07-14 DIAGNOSIS — E7800 Pure hypercholesterolemia, unspecified: Secondary | ICD-10-CM | POA: Diagnosis not present

## 2016-07-14 DIAGNOSIS — Z85820 Personal history of malignant melanoma of skin: Secondary | ICD-10-CM | POA: Diagnosis not present

## 2016-07-14 DIAGNOSIS — G2581 Restless legs syndrome: Secondary | ICD-10-CM | POA: Diagnosis not present

## 2016-08-05 DIAGNOSIS — Z96641 Presence of right artificial hip joint: Secondary | ICD-10-CM | POA: Diagnosis not present

## 2016-08-05 DIAGNOSIS — Z471 Aftercare following joint replacement surgery: Secondary | ICD-10-CM | POA: Diagnosis not present

## 2016-08-12 ENCOUNTER — Other Ambulatory Visit (HOSPITAL_COMMUNITY): Payer: Self-pay | Admitting: Internal Medicine

## 2016-08-12 DIAGNOSIS — Z1231 Encounter for screening mammogram for malignant neoplasm of breast: Secondary | ICD-10-CM

## 2016-08-13 DIAGNOSIS — I10 Essential (primary) hypertension: Secondary | ICD-10-CM | POA: Diagnosis not present

## 2016-08-13 DIAGNOSIS — N183 Chronic kidney disease, stage 3 (moderate): Secondary | ICD-10-CM | POA: Diagnosis not present

## 2016-08-13 DIAGNOSIS — E119 Type 2 diabetes mellitus without complications: Secondary | ICD-10-CM | POA: Diagnosis not present

## 2016-08-13 DIAGNOSIS — Z79899 Other long term (current) drug therapy: Secondary | ICD-10-CM | POA: Diagnosis not present

## 2016-08-14 ENCOUNTER — Ambulatory Visit (HOSPITAL_COMMUNITY)
Admission: RE | Admit: 2016-08-14 | Discharge: 2016-08-14 | Disposition: A | Discharge status: Home / Self Care | Payer: PPO | Source: Ambulatory Visit | Attending: Internal Medicine | Admitting: Internal Medicine

## 2016-08-14 DIAGNOSIS — Z1231 Encounter for screening mammogram for malignant neoplasm of breast: Secondary | ICD-10-CM

## 2016-08-18 ENCOUNTER — Other Ambulatory Visit (HOSPITAL_COMMUNITY): Payer: Self-pay | Admitting: Internal Medicine

## 2016-08-18 DIAGNOSIS — R928 Other abnormal and inconclusive findings on diagnostic imaging of breast: Secondary | ICD-10-CM

## 2016-08-20 DIAGNOSIS — Z6831 Body mass index (BMI) 31.0-31.9, adult: Secondary | ICD-10-CM | POA: Diagnosis not present

## 2016-08-20 DIAGNOSIS — I10 Essential (primary) hypertension: Secondary | ICD-10-CM | POA: Diagnosis not present

## 2016-08-20 DIAGNOSIS — E119 Type 2 diabetes mellitus without complications: Secondary | ICD-10-CM | POA: Diagnosis not present

## 2016-08-20 DIAGNOSIS — D649 Anemia, unspecified: Secondary | ICD-10-CM | POA: Diagnosis not present

## 2016-08-26 ENCOUNTER — Ambulatory Visit (HOSPITAL_COMMUNITY)
Admission: RE | Admit: 2016-08-26 | Discharge: 2016-08-26 | Disposition: A | Discharge status: Home / Self Care | Payer: PPO | Source: Ambulatory Visit | Attending: Internal Medicine | Admitting: Internal Medicine

## 2016-08-26 DIAGNOSIS — N6310 Unspecified lump in the right breast, unspecified quadrant: Secondary | ICD-10-CM | POA: Diagnosis not present

## 2016-08-26 DIAGNOSIS — R928 Other abnormal and inconclusive findings on diagnostic imaging of breast: Secondary | ICD-10-CM

## 2016-08-26 DIAGNOSIS — N6320 Unspecified lump in the left breast, unspecified quadrant: Secondary | ICD-10-CM | POA: Diagnosis not present

## 2016-08-26 DIAGNOSIS — N6311 Unspecified lump in the right breast, upper outer quadrant: Secondary | ICD-10-CM | POA: Diagnosis not present

## 2016-08-26 DIAGNOSIS — N6321 Unspecified lump in the left breast, upper outer quadrant: Secondary | ICD-10-CM | POA: Diagnosis not present

## 2016-08-27 ENCOUNTER — Other Ambulatory Visit (HOSPITAL_COMMUNITY): Payer: Self-pay | Admitting: Internal Medicine

## 2016-08-27 DIAGNOSIS — N6310 Unspecified lump in the right breast, unspecified quadrant: Secondary | ICD-10-CM

## 2016-08-27 DIAGNOSIS — N6320 Unspecified lump in the left breast, unspecified quadrant: Secondary | ICD-10-CM

## 2016-09-09 ENCOUNTER — Ambulatory Visit (HOSPITAL_COMMUNITY)
Admission: RE | Admit: 2016-09-09 | Discharge: 2016-09-09 | Disposition: A | Discharge status: Home / Self Care | Payer: PPO | Source: Ambulatory Visit | Attending: Internal Medicine | Admitting: Internal Medicine

## 2016-09-09 ENCOUNTER — Other Ambulatory Visit (HOSPITAL_COMMUNITY): Payer: Self-pay | Admitting: Internal Medicine

## 2016-09-09 DIAGNOSIS — N6310 Unspecified lump in the right breast, unspecified quadrant: Secondary | ICD-10-CM

## 2016-09-09 DIAGNOSIS — C50211 Malignant neoplasm of upper-inner quadrant of right female breast: Secondary | ICD-10-CM | POA: Diagnosis not present

## 2016-09-09 DIAGNOSIS — C50512 Malignant neoplasm of lower-outer quadrant of left female breast: Secondary | ICD-10-CM | POA: Diagnosis not present

## 2016-09-09 DIAGNOSIS — D0511 Intraductal carcinoma in situ of right breast: Secondary | ICD-10-CM | POA: Diagnosis not present

## 2016-09-09 DIAGNOSIS — N6320 Unspecified lump in the left breast, unspecified quadrant: Secondary | ICD-10-CM

## 2016-09-09 DIAGNOSIS — N6312 Unspecified lump in the right breast, upper inner quadrant: Secondary | ICD-10-CM | POA: Diagnosis not present

## 2016-09-09 DIAGNOSIS — R928 Other abnormal and inconclusive findings on diagnostic imaging of breast: Secondary | ICD-10-CM | POA: Insufficient documentation

## 2016-09-09 DIAGNOSIS — C50912 Malignant neoplasm of unspecified site of left female breast: Secondary | ICD-10-CM | POA: Diagnosis not present

## 2016-09-09 DIAGNOSIS — D241 Benign neoplasm of right breast: Secondary | ICD-10-CM | POA: Diagnosis not present

## 2016-09-09 DIAGNOSIS — N6323 Unspecified lump in the left breast, lower outer quadrant: Secondary | ICD-10-CM | POA: Diagnosis not present

## 2016-09-09 MED ORDER — LIDOCAINE HCL (PF) 1 % IJ SOLN
INTRAMUSCULAR | Status: AC
  Administered 2016-09-09: 10 mL
  Filled 2016-09-09: qty 15

## 2016-09-09 MED ORDER — LIDOCAINE-EPINEPHRINE (PF) 1 %-1:200000 IJ SOLN
INTRAMUSCULAR | Status: AC
  Administered 2016-09-09: 10 mL
  Filled 2016-09-09: qty 30

## 2016-09-17 DIAGNOSIS — C50911 Malignant neoplasm of unspecified site of right female breast: Secondary | ICD-10-CM | POA: Diagnosis not present

## 2016-09-17 DIAGNOSIS — C50912 Malignant neoplasm of unspecified site of left female breast: Secondary | ICD-10-CM | POA: Diagnosis not present

## 2016-10-03 ENCOUNTER — Other Ambulatory Visit: Payer: Self-pay | Admitting: General Surgery

## 2016-10-03 DIAGNOSIS — C50512 Malignant neoplasm of lower-outer quadrant of left female breast: Secondary | ICD-10-CM | POA: Diagnosis not present

## 2016-10-03 DIAGNOSIS — R928 Other abnormal and inconclusive findings on diagnostic imaging of breast: Secondary | ICD-10-CM

## 2016-10-03 DIAGNOSIS — C50211 Malignant neoplasm of upper-inner quadrant of right female breast: Secondary | ICD-10-CM

## 2016-10-03 DIAGNOSIS — Z170 Estrogen receptor positive status [ER+]: Principal | ICD-10-CM

## 2016-10-03 DIAGNOSIS — N63 Unspecified lump in unspecified breast: Secondary | ICD-10-CM

## 2016-10-07 ENCOUNTER — Ambulatory Visit
Admission: RE | Admit: 2016-10-07 | Discharge: 2016-10-07 | Disposition: A | Discharge status: Home / Self Care | Payer: PPO | Source: Ambulatory Visit | Attending: General Surgery | Admitting: General Surgery

## 2016-10-07 ENCOUNTER — Other Ambulatory Visit: Payer: Self-pay | Admitting: General Surgery

## 2016-10-07 DIAGNOSIS — D241 Benign neoplasm of right breast: Secondary | ICD-10-CM | POA: Diagnosis not present

## 2016-10-07 DIAGNOSIS — N63 Unspecified lump in unspecified breast: Secondary | ICD-10-CM

## 2016-10-07 DIAGNOSIS — N6312 Unspecified lump in the right breast, upper inner quadrant: Secondary | ICD-10-CM | POA: Diagnosis not present

## 2016-10-07 DIAGNOSIS — N6311 Unspecified lump in the right breast, upper outer quadrant: Secondary | ICD-10-CM | POA: Diagnosis not present

## 2016-10-09 ENCOUNTER — Telehealth: Payer: Self-pay | Admitting: Hematology

## 2016-10-09 ENCOUNTER — Ambulatory Visit (HOSPITAL_COMMUNITY): Payer: PPO | Attending: General Surgery | Admitting: Physical Therapy

## 2016-10-09 ENCOUNTER — Encounter: Payer: Self-pay | Admitting: Hematology

## 2016-10-09 ENCOUNTER — Encounter (HOSPITAL_COMMUNITY): Payer: Self-pay | Admitting: Physical Therapy

## 2016-10-09 DIAGNOSIS — M25611 Stiffness of right shoulder, not elsewhere classified: Secondary | ICD-10-CM | POA: Insufficient documentation

## 2016-10-09 DIAGNOSIS — M25511 Pain in right shoulder: Secondary | ICD-10-CM | POA: Diagnosis not present

## 2016-10-09 NOTE — Pre-Procedure Instructions (Signed)
   [place] JACQULYNE GLADUE  10/09/2016      [id number] - [place], [place] - [address] [place] [address] Phone: [phone] Fax: [phone]  [facility], [place] [address] AT [address] [place] [address] Phone: [phone] Fax: [phone]  [facility] [place], [address] AT [address] [place] [address] Phone: [phone] Fax: [phone]    Your procedure is scheduled on Wednesday, July 11th   Report to [facility] Admitting at 9:30 AM             (posted surgery time 11:30 am - 2:00 pm)   Call this number if you have problems the morning of surgery:  [phone], for other questions call [phone] Mon-Fri from 8a-4p   Remember:   Do not eat food or drink liquids after midnight Tuesday.              4-5 days prior to surgery, STOP taking any herbal supplements, anti-inflammatories, vitamins.   Take these medicines the morning of surgery with A SIP OF WATER : Atenolol, Sertraline.   Do not wear jewelry, make-up or nail polish.  Do not wear lotions, powders,  perfumes, or deoderant.  Do not shave 48 hours prior to surgery.     Do not bring valuables to the hospital.  [facility] is not responsible for any belongings or valuables.  Contacts, dentures or bridgework may not be worn into surgery.  Leave your suitcase in the car.  After surgery it may be brought to your room.  For patients admitted to the hospital, discharge time will be determined by your treatment team.  Patients discharged the day of surgery will not be allowed to drive home, and will need someone to stay with you for the first 24 hrs.  Please read over the following fact sheets that you were given. Pain Booklet and Surgical Site Infection Prevention

## 2016-10-09 NOTE — Telephone Encounter (Signed)
Appt has been scheduled for the pt to see Dr. Burr Medico on 7/19 at 11am. Lft a vm for the pt to call me back. Letter mailed.

## 2016-10-09 NOTE — Patient Instructions (Addendum)
  DOORWAY STRETCH - LOW 3x30 sec/ 2x a day  While standing in a doorway, place your arm downward on the door frame and lean in until a stretch is felt along the front of your chest and/or shoulder. Your arm should be pointed downward towards the floor along the door frame.  NOTE: Your legs should control how much you stretch by bending or straightening your knee through the doorway.     Doorway Pec Stretch  3x30 sec/ 2x a day  Place hands on either side of doorway as shown. Step one foot forward and gently lean chest forward to feel a mild-moderate sense of stretch across the chest.      SEATED LATERAL TRUNK STRETCH 5x15 sec, 2x a day  While in a seated position, raise up your arm and bend to the opposite side for a stretch.    Scapular Retraction with External Rotation x10 reps/ 2x a day  Sit or Standing holding a theraband in your hands, palms facing up. Keep your elbows pulled in at your sides and squeeze your shoulder blades back and together, allowing your arms to rotate out to the side.  Standing Flexion  Start with elbows straight, arms at your side, lift weights (or cans of food) upward to shoulder height. Squeezing your shoulder blades down and in. Lower arms and repeat.  x20 reps, 2x a day

## 2016-10-09 NOTE — Therapy (Signed)
[place], [place], [address] Phone: [phone]   Fax:  [phone]  Physical Therapy Evaluation (One time visit for HEP)  Patient Details  Name: Julie Sosa MRN: [id number] Date of Birth: 1936/01/16 Referring Provider: [name], MD  Encounter Date: 10/09/2016      PT End of Session - 10/09/16 0949    Visit Number 1   Number of Visits 1   Authorization Type [employer]    Authorization Time Period 10/09/16 to 10/15/16   PT Start Time 3419  pt seen early due to availability in therapist's schedule   PT Stop Time 0935   PT Time Calculation (min) 44 min   Activity Tolerance Patient tolerated treatment well;No increased pain   Behavior During Therapy WFL for tasks assessed/performed      Past Medical History:  Diagnosis Date  . Anxiety   . Arthritis    osteoarthritis  . Cancer ([place])    melanoma right arm  . Depression   . GERD (gastroesophageal reflux disease)   . Hypercholesteremia   . Hypertension   . Restless leg syndrome     Past Surgical History:  Procedure Laterality Date  . ABDOMINAL HYSTERECTOMY    . BREAST SURGERY     left breast cyst removed  . CATARACT EXTRACTION W/PHACO  03/11/2012   Procedure: CATARACT EXTRACTION PHACO AND INTRAOCULAR LENS PLACEMENT (IOC);  Surgeon: Tonny Branch, MD;  Location: [facility];  Service: Ophthalmology;  Laterality: Right;  CDE:17.71  . CATARACT EXTRACTION W/PHACO  03/29/2012   Procedure: CATARACT EXTRACTION PHACO AND INTRAOCULAR LENS PLACEMENT (IOC);  Surgeon: Tonny Branch, MD;  Location: [facility];  Service: Ophthalmology;  Laterality: Left;  CDE:17.49  . CHOLECYSTECTOMY  10 yrs ago   Arnoldo Morale  . EYE SURGERY Bilateral    cataract extraction  . MUSCLE BIOPSY     removal of cyst-right calf  . TOTAL HIP ARTHROPLASTY Left 12/25/2014   Procedure: TOTAL HIP ARTHROPLASTY;  Surgeon: Dereck Leep, MD;  Location: [facility];  Service: Orthopedics;  Laterality:  Left;  . TOTAL HIP ARTHROPLASTY Right 06/23/2016   Procedure: TOTAL HIP ARTHROPLASTY;  Surgeon: Dereck Leep, MD;  Location: [facility];  Service: Orthopedics;  Laterality: Right;  . YAG LASER APPLICATION Right 09/26/2977   Procedure: YAG LASER APPLICATION;  Surgeon: Williams Che, MD;  Location: [facility];  Service: Ophthalmology;  Laterality: Right;  . YAG LASER APPLICATION Left 8/92/1194   Procedure: YAG LASER APPLICATION;  Surgeon: Williams Che, MD;  Location: [facility];  Service: Ophthalmology;  Laterality: Left;    There were no vitals filed for this visit.       Subjective Assessment - 10/09/16 0856    Subjective Pt reports that greater than 10 years ago she was in a boat and fell. She tried to catch herself with her arms. Since then she has had issues with her arm and lifting things. She is having    Pertinent History breast cancer B; skin melanoma Rt arm, THA in 2016/2018   Limitations Lifting   Currently in Pain? No/denies            [facility] PT Assessment - 10/09/16 0001      Assessment   Medical Diagnosis Rt shoulder mobility prior to sx   Referring Provider [name], MD   Next MD Visit in the next couple of days for pre-op visit   Prior Therapy none      Precautions  Precautions None     Balance Screen   Has the patient fallen in the past 6 months No   Has the patient had a decrease in activity level because of a fear of falling?  No   Is the patient reluctant to leave their home because of a fear of falling?  No     Home Environment   Living Environment Private residence   Available Help at Discharge Family     Prior Function   Level of Independence Independent     Cognition   Overall Cognitive Status Within Functional Limits for tasks assessed     Sensation   Light Touch Appears Intact     Posture/Postural Control   Posture Comments rounded shoulders      ROM / Strength   AROM / PROM / Strength AROM;PROM;Strength     AROM   Overall AROM   Within functional limits for tasks performed   Overall AROM Comments HBB: Rt T4, Lt T2    HBH: T3 BUE   AROM Assessment Site Shoulder   Right/Left Shoulder Right;Left     PROM   Overall PROM  Within functional limits for tasks performed   PROM Assessment Site Shoulder   Right/Left Shoulder Right;Left     Strength   Strength Assessment Site Shoulder;Elbow   Right/Left Shoulder Right;Left   Right Shoulder Flexion 3+/5  (+) discomfort proximal shoulder   Right Shoulder ABduction 4-/5   Right Shoulder Internal Rotation 4/5   Right Shoulder External Rotation 4/5   Right Shoulder Horizontal ADduction 4-/5   Left Shoulder Flexion 4-/5   Left Shoulder ABduction 4-/5   Left Shoulder Internal Rotation 4/5   Left Shoulder External Rotation 4/5   Right/Left Elbow Right;Left   Right Elbow Flexion 4/5   Right Elbow Extension 4/5     Palpation   Palpation comment Tendereness plapated along periscaplar muscle groups and teres muslce groups on the Rt         Objective measurements completed on examination: See above findings.          [facility] PT Treatment/Exercise - 10/09/16 0001      Exercises   Exercises Shoulder     Shoulder Exercises: Seated   Elevation Right   Elevation Limitations seated lateral trunk stretch with shoulder elevation 2x15 sec, HEP demo    External Rotation Both;10 reps   Theraband Level (Shoulder External Rotation) Level 2 (Red)   External Rotation Limitations HEP demo    Flexion Both;10 reps   Flexion Limitations HEP demo     Manual Therapy   Manual Therapy Myofascial release   Manual therapy comments Separate rest of session   Myofascial Release Trigger point release Rt teres group and latissimus with palpable twitch response                 PT Education - 10/09/16 0945    Education provided Yes   Education Details noted areas of limitation and likely cause of muscle soreness following her fall many years ago; reviewed and implemented  pr-op HEP to preserve ROM and improve strenght/flexibility prior to surgery; provided pt with [employer] Post-op info following breast cancer for her to take to pre-op appointment for any further questions/concerns and therapist discussed how this is typically reviewed at the proper time following surgery   Person(s) Educated Patient   Methods Explanation;Demonstration;Tactile cues;Handout   Comprehension Returned demonstration;Verbalized understanding          PT Short Term Goals -  2016-11-01 1021      PT SHORT TERM GOAL #1   Title Pt will demo understanding of HEP to facilitate improvements in UE flexibility and strength prior to surgery.   Time 1   Period Days   Status Achieved            Plan - 11-01-16 0950    Clinical Impression Statement Pt is a pleasant 80y.o F referred to OPPT to address Rt shoulder mobility prior to breast surgery next week. She demonstrates full active and passive shoulder elevation with limitations primarily with shoulder IR and general shoulder strength. Noted palpable tenderness along the posterior shoulder specifically along the teres muscle group and performed trigger point release with palpable twitch response. Pt is having surgery in less than a week and is agreeable to a one time visit to implement an HEP for improved flexibility and strength prior her surgery. She was able to demonstrate proper technique and good understanding of all exercises reviewed today. Therapist encouraged pt to hold off on this HEP after surgery and to speak with surgeon about post-op guidelines and proper exercises for optimal outcome. PT will sign off.    Clinical Presentation Evolving   Clinical Presentation due to: Pt undergoing breast surgery on 10/15/16 which will likely change the course of her progress    Clinical Decision Making Low   Rehab Potential Good   PT Frequency One time visit   PT Treatment/Interventions ADLs/Self Care Home Management;Therapeutic  activities;Therapeutic exercise;Passive range of motion   PT Next Visit Plan n/a due to one time visit    PT Home Exercise Plan see pt instructions    Recommended Other Services none    Consulted and Agree with Plan of Care Patient      Patient will benefit from skilled therapeutic intervention in order to improve the following deficits and impairments:  Impaired flexibility, Impaired UE functional use, Pain, Postural dysfunction, Decreased strength  Visit Diagnosis: Right shoulder pain, unspecified chronicity  Stiffness of right shoulder, not elsewhere classified      G-Codes - 01-Nov-2016 1023    Functional Assessment Tool Used (Outpatient Only) Clinical judgement based on assessment of ROM, strength and UE use   Functional Limitation Carrying, moving and handling objects   Carrying, Moving and Handling Objects Current Status (E9528) At least 1 percent but less than 20 percent impaired, limited or restricted   Carrying, Moving and Handling Objects Goal Status (U1324) At least 1 percent but less than 20 percent impaired, limited or restricted   Carrying, Moving and Handling Objects Discharge Status [phone]) At least 1 percent but less than 20 percent impaired, limited or restricted       Problem List Patient Active Problem List   Diagnosis Date Noted  . Degenerative joint disease (DJD) of hip 12/25/2014  . S/P total hip arthroplasty 12/25/2014    10:26 AM,2016/11/01 Elly Modena PT, DPT [facility] [place] [address] [place], [place], [address] Phone: [phone]   Fax:  [phone]  Name: Julie Sosa MRN: [id number] Date of Birth: May 12, 1935

## 2016-10-09 NOTE — Telephone Encounter (Signed)
Pt returned call and agreed to appt w/Dr. Burr Medico on 7/19 at 11am. Letter mailed.

## 2016-10-10 ENCOUNTER — Encounter (HOSPITAL_COMMUNITY): Payer: Self-pay

## 2016-10-10 ENCOUNTER — Encounter (HOSPITAL_COMMUNITY)
Admission: RE | Admit: 2016-10-10 | Discharge: 2016-10-10 | Disposition: A | Discharge status: Home / Self Care | Payer: PPO | Source: Ambulatory Visit | Attending: General Surgery | Admitting: General Surgery

## 2016-10-10 DIAGNOSIS — Z01818 Encounter for other preprocedural examination: Secondary | ICD-10-CM | POA: Insufficient documentation

## 2016-10-10 HISTORY — DX: Headache, unspecified: R51.9

## 2016-10-10 HISTORY — DX: Anemia, unspecified: D64.9

## 2016-10-10 HISTORY — DX: Headache: R51

## 2016-10-10 LAB — COMPREHENSIVE METABOLIC PANEL
ALK PHOS: 70 U/L (ref 38–126)
ALT: 21 U/L (ref 14–54)
ANION GAP: 9 (ref 5–15)
AST: 22 U/L (ref 15–41)
Albumin: 4.3 g/dL (ref 3.5–5.0)
BUN: 8 mg/dL (ref 6–20)
CALCIUM: 9.7 mg/dL (ref 8.9–10.3)
CHLORIDE: 102 mmol/L (ref 101–111)
CO2: 24 mmol/L (ref 22–32)
Creatinine, Ser: 0.92 mg/dL (ref 0.44–1.00)
GFR calc non Af Amer: 58 mL/min — ABNORMAL LOW (ref >60)
Glucose, Bld: 109 mg/dL — ABNORMAL HIGH (ref 65–99)
Potassium: 3.9 mmol/L (ref 3.5–5.1)
SODIUM: 135 mmol/L (ref 135–145)
Total Bilirubin: 0.8 mg/dL (ref 0.3–1.2)
Total Protein: 7 g/dL (ref 6.5–8.1)

## 2016-10-10 LAB — CBC WITH DIFFERENTIAL/PLATELET
Basophils Absolute: 0 10*3/uL (ref 0.0–0.1)
Basophils Relative: 1 %
EOS ABS: 0.1 10*3/uL (ref 0.0–0.7)
EOS PCT: 1 %
HCT: 37.4 % (ref 36.0–46.0)
Hemoglobin: 12 g/dL (ref 12.0–15.0)
LYMPHS ABS: 1.1 10*3/uL (ref 0.7–4.0)
Lymphocytes Relative: 20 %
MCH: 27.4 pg (ref 26.0–34.0)
MCHC: 32.1 g/dL (ref 30.0–36.0)
MCV: 85.4 fL (ref 78.0–100.0)
MONOS PCT: 8 %
Monocytes Absolute: 0.5 10*3/uL (ref 0.1–1.0)
Neutro Abs: 3.9 10*3/uL (ref 1.7–7.7)
Neutrophils Relative %: 70 %
PLATELETS: 198 10*3/uL (ref 150–400)
RBC: 4.38 MIL/uL (ref 3.87–5.11)
RDW: 13.2 % (ref 11.5–15.5)
WBC: 5.6 10*3/uL (ref 4.0–10.5)

## 2016-10-10 NOTE — Progress Notes (Signed)
Does not see a cardiologist  Denies any cardiac testing

## 2016-10-10 NOTE — Pre-Procedure Instructions (Addendum)
[place]  10/10/2016      [id number] - [place], [address] [place] [address] Phone: [phone] Fax: [phone]  [facility] [place] [place] - [address] AT [place] [address] Phone: [phone] Fax: [phone]  [facility] [place], [address] AT [place] [address] Phone: [phone] Fax: [phone]    Your procedure is scheduled on Wednesday July 11 .  Report to [facility] Admitting at  9:30  A.M.   Call this number if you have problems the morning of surgery:  [phone]   Remember:  Do not eat food or drink liquids after midnight.   Take these medicines the morning of surgery with A SIP OF WATER Atenolol,Sertraline  STOP ASPIRIN,ANTIINFLAMATORIES (IBUPROFEN,ALEVE,MOTRIN,ADVIL,GOODY'S POWDERS),HERBAL SUPPLEMENTS,FISH OIL,AND VITAMINS 5-7 DAYS PRIOR TO SURGERY  Please complete your 8oz of Boost Breeze by 7:30 AM on the day of your surgery   Special Instructions: [facility] - Preparing for Surgery  Before surgery, you can play an important role.  Because skin is not sterile, your skin needs to be as free of germs as possible.  You can reduce the number of germs on you skin by washing with CHG (chlorahexidine gluconate) soap before surgery.  CHG is an antiseptic cleaner which kills germs and bonds with the skin to continue killing germs even after washing.  Please DO NOT use if you have an allergy to CHG or antibacterial soaps.  If your skin becomes reddened/irritated stop using the CHG and inform your nurse when you arrive at Short Stay.  Do not shave (including legs and underarms) for at least 48 hours prior to the first CHG shower.  You may shave your face.  Please follow these instructions carefully:   1.   Shower with CHG Soap the night before surgery and the   morning of Surgery.  2.  If you choose to wash your hair, wash your hair first as usual with your normal shampoo.  3.  After you shampoo, rinse your hair and body thoroughly to remove the  Shampoo.  4.  Use CHG as you would any other liquid soap.  You can apply chg directly  to the skin and wash gently with scrungie or a clean washcloth.  5.  Apply the CHG Soap to your body ONLY FROM THE NECK DOWN.   Do not use on open wounds or open sores.  Avoid contact with your eyes,  ears, mouth and genitals (private parts).  Wash genitals (private parts) with your normal soap.  6.  Wash thoroughly, paying special attention to the area where your surgery will be performed.  7.  Thoroughly rinse your body with warm water from the neck down.  8.  DO NOT shower/wash with your normal soap after using and rinsing o  the CHG Soap.  9.  Pat yourself dry with a clean towel.            10.  Wear clean pajamas.            11.  Place clean sheets on your bed the night of your first shower and do not sleep with pets.  Day of Surgery  Do not apply any lotions/deodorants the morning of surgery.  Please wear clean clothes to the hospital/surgery center.    Do not wear jewelry, make-up or nail polish.  Do not wear lotions, powders, or perfumes, or deoderant.  Do not shave 48 hours prior to surgery.  Men may shave face and neck.  Do not bring valuables to the hospital.  [facility] is not responsible for any belongings or valuables.  Contacts, dentures or bridgework may not be worn into surgery.  Leave your suitcase in the car.  After surgery it may be brought to your room.  For patients admitted to the hospital, discharge time will be determined by your treatment team.  Patients discharged the day of surgery will not be allowed to drive home.      Please read over the following fact sheets that you were given. Surgical Site Infection Prevention

## 2016-10-14 ENCOUNTER — Ambulatory Visit
Admission: RE | Admit: 2016-10-14 | Discharge: 2016-10-14 | Disposition: A | Discharge status: Home / Self Care | Payer: PPO | Source: Ambulatory Visit | Attending: General Surgery | Admitting: General Surgery

## 2016-10-14 DIAGNOSIS — C50912 Malignant neoplasm of unspecified site of left female breast: Secondary | ICD-10-CM | POA: Diagnosis not present

## 2016-10-14 DIAGNOSIS — C50512 Malignant neoplasm of lower-outer quadrant of left female breast: Secondary | ICD-10-CM

## 2016-10-14 DIAGNOSIS — D241 Benign neoplasm of right breast: Secondary | ICD-10-CM | POA: Diagnosis not present

## 2016-10-14 DIAGNOSIS — C50211 Malignant neoplasm of upper-inner quadrant of right female breast: Secondary | ICD-10-CM

## 2016-10-14 DIAGNOSIS — Z170 Estrogen receptor positive status [ER+]: Principal | ICD-10-CM

## 2016-10-14 DIAGNOSIS — R928 Other abnormal and inconclusive findings on diagnostic imaging of breast: Secondary | ICD-10-CM

## 2016-10-15 ENCOUNTER — Ambulatory Visit
Admission: RE | Admit: 2016-10-15 | Discharge: 2016-10-15 | Disposition: A | Discharge status: Home / Self Care | Payer: PPO | Source: Ambulatory Visit | Attending: General Surgery | Admitting: General Surgery

## 2016-10-15 ENCOUNTER — Encounter (HOSPITAL_COMMUNITY)
Admission: RE | Disposition: A | Discharge status: Home / Self Care | Payer: Self-pay | Source: Ambulatory Visit | Attending: General Surgery

## 2016-10-15 ENCOUNTER — Ambulatory Visit (HOSPITAL_COMMUNITY)
Admission: RE | Admit: 2016-10-15 | Discharge: 2016-10-15 | Disposition: A | Discharge status: Home / Self Care | Payer: PPO | Source: Ambulatory Visit | Attending: General Surgery | Admitting: General Surgery

## 2016-10-15 ENCOUNTER — Encounter (HOSPITAL_COMMUNITY): Payer: Self-pay | Admitting: Urology

## 2016-10-15 ENCOUNTER — Ambulatory Visit (HOSPITAL_COMMUNITY): Payer: PPO | Admitting: Certified Registered Nurse Anesthetist

## 2016-10-15 DIAGNOSIS — Z170 Estrogen receptor positive status [ER+]: Principal | ICD-10-CM

## 2016-10-15 DIAGNOSIS — C50911 Malignant neoplasm of unspecified site of right female breast: Secondary | ICD-10-CM | POA: Diagnosis not present

## 2016-10-15 DIAGNOSIS — C50512 Malignant neoplasm of lower-outer quadrant of left female breast: Secondary | ICD-10-CM | POA: Insufficient documentation

## 2016-10-15 DIAGNOSIS — C50211 Malignant neoplasm of upper-inner quadrant of right female breast: Secondary | ICD-10-CM | POA: Insufficient documentation

## 2016-10-15 DIAGNOSIS — C50812 Malignant neoplasm of overlapping sites of left female breast: Secondary | ICD-10-CM | POA: Diagnosis not present

## 2016-10-15 DIAGNOSIS — I10 Essential (primary) hypertension: Secondary | ICD-10-CM | POA: Insufficient documentation

## 2016-10-15 DIAGNOSIS — K219 Gastro-esophageal reflux disease without esophagitis: Secondary | ICD-10-CM | POA: Diagnosis not present

## 2016-10-15 DIAGNOSIS — D241 Benign neoplasm of right breast: Secondary | ICD-10-CM | POA: Diagnosis not present

## 2016-10-15 DIAGNOSIS — F419 Anxiety disorder, unspecified: Secondary | ICD-10-CM | POA: Diagnosis not present

## 2016-10-15 DIAGNOSIS — C50912 Malignant neoplasm of unspecified site of left female breast: Secondary | ICD-10-CM | POA: Diagnosis not present

## 2016-10-15 DIAGNOSIS — Z79899 Other long term (current) drug therapy: Secondary | ICD-10-CM | POA: Insufficient documentation

## 2016-10-15 DIAGNOSIS — G8918 Other acute postprocedural pain: Secondary | ICD-10-CM | POA: Diagnosis not present

## 2016-10-15 HISTORY — PX: BREAST LUMPECTOMY WITH RADIOACTIVE SEED AND SENTINEL LYMPH NODE BIOPSY: SHX6550

## 2016-10-15 SURGERY — BREAST LUMPECTOMY WITH RADIOACTIVE SEED AND SENTINEL LYMPH NODE BIOPSY
Anesthesia: General | Site: Breast | Laterality: Bilateral

## 2016-10-15 MED ORDER — FENTANYL CITRATE (PF) 100 MCG/2ML IJ SOLN
25.0000 ug | INTRAMUSCULAR | Status: DC | PRN
  Administered 2016-10-15 (×4): 25 ug via INTRAVENOUS

## 2016-10-15 MED ORDER — GLYCOPYRROLATE 0.2 MG/ML IV SOSY
PREFILLED_SYRINGE | INTRAVENOUS | Status: DC | PRN
  Administered 2016-10-15: 0.4 mg via INTRAVENOUS

## 2016-10-15 MED ORDER — ROCURONIUM BROMIDE 50 MG/5ML IV SOLN
INTRAVENOUS | Status: AC
  Filled 2016-10-15: qty 1

## 2016-10-15 MED ORDER — LACTATED RINGERS IV SOLN
INTRAVENOUS | Status: DC
  Administered 2016-10-15 (×2): via INTRAVENOUS

## 2016-10-15 MED ORDER — OXYCODONE HCL 5 MG PO TABS
ORAL_TABLET | ORAL | Status: AC
  Administered 2016-10-15: 10 mg via ORAL
  Filled 2016-10-15: qty 2

## 2016-10-15 MED ORDER — FENTANYL CITRATE (PF) 100 MCG/2ML IJ SOLN
INTRAMUSCULAR | Status: AC
  Administered 2016-10-15: 100 ug via INTRAVENOUS
  Filled 2016-10-15: qty 2

## 2016-10-15 MED ORDER — DEXAMETHASONE SODIUM PHOSPHATE 10 MG/ML IJ SOLN
INTRAMUSCULAR | Status: AC
  Filled 2016-10-15: qty 1

## 2016-10-15 MED ORDER — LIDOCAINE 2% (20 MG/ML) 5 ML SYRINGE
INTRAMUSCULAR | Status: DC | PRN
  Administered 2016-10-15: 100 mg via INTRAVENOUS

## 2016-10-15 MED ORDER — PROPOFOL 10 MG/ML IV BOLUS
INTRAVENOUS | Status: DC | PRN
  Administered 2016-10-15: 110 mg via INTRAVENOUS

## 2016-10-15 MED ORDER — SODIUM CHLORIDE 0.9 % IJ SOLN
INTRAVENOUS | Status: DC | PRN
  Administered 2016-10-15: 12:00:00 via INTRAMUSCULAR

## 2016-10-15 MED ORDER — LACTATED RINGERS IV SOLN: INTRAVENOUS | Status: DC

## 2016-10-15 MED ORDER — MIDAZOLAM HCL 2 MG/2ML IJ SOLN
INTRAMUSCULAR | Status: AC
  Filled 2016-10-15: qty 2

## 2016-10-15 MED ORDER — FENTANYL CITRATE (PF) 250 MCG/5ML IJ SOLN
INTRAMUSCULAR | Status: AC
  Filled 2016-10-15: qty 5

## 2016-10-15 MED ORDER — CHLORHEXIDINE GLUCONATE CLOTH 2 % EX PADS: 6.0000 | MEDICATED_PAD | Freq: Once | CUTANEOUS | Status: DC

## 2016-10-15 MED ORDER — FENTANYL CITRATE (PF) 100 MCG/2ML IJ SOLN
100.0000 ug | Freq: Once | INTRAMUSCULAR | Status: AC
  Administered 2016-10-15: 100 ug via INTRAVENOUS

## 2016-10-15 MED ORDER — PHENYLEPHRINE 40 MCG/ML (10ML) SYRINGE FOR IV PUSH (FOR BLOOD PRESSURE SUPPORT)
PREFILLED_SYRINGE | INTRAVENOUS | Status: AC
  Filled 2016-10-15: qty 10

## 2016-10-15 MED ORDER — BUPIVACAINE HCL (PF) 0.5 % IJ SOLN
INTRAMUSCULAR | Status: AC
  Filled 2016-10-15: qty 10

## 2016-10-15 MED ORDER — ONDANSETRON HCL 4 MG/2ML IJ SOLN
INTRAMUSCULAR | Status: DC | PRN
  Administered 2016-10-15: 4 mg via INTRAVENOUS

## 2016-10-15 MED ORDER — BUPIVACAINE HCL (PF) 0.25 % IJ SOLN
INTRAMUSCULAR | Status: DC | PRN
  Administered 2016-10-15 (×2): 30 mL

## 2016-10-15 MED ORDER — 0.9 % SODIUM CHLORIDE (POUR BTL) OPTIME
TOPICAL | Status: DC | PRN
  Administered 2016-10-15: 1000 mL

## 2016-10-15 MED ORDER — LIDOCAINE HCL (PF) 1 % IJ SOLN
INTRAMUSCULAR | Status: AC
  Filled 2016-10-15: qty 30

## 2016-10-15 MED ORDER — METHYLENE BLUE 0.5 % INJ SOLN
INTRAVENOUS | Status: AC
  Filled 2016-10-15: qty 10

## 2016-10-15 MED ORDER — FENTANYL CITRATE (PF) 100 MCG/2ML IJ SOLN
INTRAMUSCULAR | Status: AC
  Administered 2016-10-15: 25 ug via INTRAVENOUS
  Filled 2016-10-15: qty 2

## 2016-10-15 MED ORDER — OXYCODONE HCL 5 MG PO TABS
5.0000 mg | ORAL_TABLET | ORAL | Status: DC | PRN
  Administered 2016-10-15: 10 mg via ORAL

## 2016-10-15 MED ORDER — ROCURONIUM BROMIDE 10 MG/ML (PF) SYRINGE
PREFILLED_SYRINGE | INTRAVENOUS | Status: DC | PRN
  Administered 2016-10-15: 50 mg via INTRAVENOUS

## 2016-10-15 MED ORDER — LIDOCAINE HCL (CARDIAC) 20 MG/ML IV SOLN
INTRAVENOUS | Status: AC
  Filled 2016-10-15: qty 10

## 2016-10-15 MED ORDER — LIDOCAINE HCL 1 % IJ SOLN
INTRAMUSCULAR | Status: DC | PRN
  Administered 2016-10-15: 54 mL via INTRAMUSCULAR

## 2016-10-15 MED ORDER — METOCLOPRAMIDE HCL 5 MG/ML IJ SOLN: 10.0000 mg | Freq: Once | INTRAMUSCULAR | Status: DC | PRN

## 2016-10-15 MED ORDER — ONDANSETRON HCL 4 MG/2ML IJ SOLN
INTRAMUSCULAR | Status: AC
  Filled 2016-10-15: qty 2

## 2016-10-15 MED ORDER — PROPOFOL 10 MG/ML IV BOLUS
INTRAVENOUS | Status: AC
  Filled 2016-10-15: qty 20

## 2016-10-15 MED ORDER — CIPROFLOXACIN IN D5W 400 MG/200ML IV SOLN
400.0000 mg | INTRAVENOUS | Status: AC
  Administered 2016-10-15: 400 mg via INTRAVENOUS
  Filled 2016-10-15: qty 200

## 2016-10-15 MED ORDER — SUGAMMADEX SODIUM 200 MG/2ML IV SOLN
INTRAVENOUS | Status: AC
  Filled 2016-10-15: qty 2

## 2016-10-15 MED ORDER — ARTIFICIAL TEARS OPHTHALMIC OINT
TOPICAL_OINTMENT | OPHTHALMIC | Status: AC
  Filled 2016-10-15: qty 3.5

## 2016-10-15 MED ORDER — ACETAMINOPHEN 500 MG PO TABS
1000.0000 mg | ORAL_TABLET | ORAL | Status: AC
  Administered 2016-10-15: 1000 mg via ORAL
  Filled 2016-10-15: qty 2

## 2016-10-15 MED ORDER — OXYCODONE HCL 5 MG PO TABS: 5.0000 mg | ORAL_TABLET | ORAL | 0 refills | Status: DC | PRN

## 2016-10-15 MED ORDER — MEPERIDINE HCL 25 MG/ML IJ SOLN: 6.2500 mg | INTRAMUSCULAR | Status: DC | PRN

## 2016-10-15 MED ORDER — BUPIVACAINE-EPINEPHRINE (PF) 0.25% -1:200000 IJ SOLN
INTRAMUSCULAR | Status: AC
  Filled 2016-10-15: qty 30

## 2016-10-15 MED ORDER — TECHNETIUM TC 99M SULFUR COLLOID FILTERED
1.0000 | Freq: Once | INTRAVENOUS | Status: AC | PRN
  Administered 2016-10-15: 1 via INTRADERMAL

## 2016-10-15 MED ORDER — SUGAMMADEX SODIUM 200 MG/2ML IV SOLN
INTRAVENOUS | Status: DC | PRN
  Administered 2016-10-15: 150 mg via INTRAVENOUS

## 2016-10-15 MED ORDER — SODIUM CHLORIDE 0.9 % IJ SOLN
INTRAMUSCULAR | Status: AC
  Filled 2016-10-15: qty 10

## 2016-10-15 MED ORDER — FENTANYL CITRATE (PF) 100 MCG/2ML IJ SOLN
INTRAMUSCULAR | Status: DC | PRN
  Administered 2016-10-15 (×2): 50 ug via INTRAVENOUS
  Administered 2016-10-15: 100 ug via INTRAVENOUS
  Administered 2016-10-15: 50 ug via INTRAVENOUS

## 2016-10-15 MED ORDER — DEXAMETHASONE SODIUM PHOSPHATE 10 MG/ML IJ SOLN
INTRAMUSCULAR | Status: DC | PRN
  Administered 2016-10-15: 10 mg via INTRAVENOUS

## 2016-10-15 SURGICAL SUPPLY — 56 items
ADH SKN CLS APL DERMABOND .7 (GAUZE/BANDAGES/DRESSINGS) ×1
APPLIER CLIP 9.375 MED OPEN (MISCELLANEOUS) ×3
APR CLP MED 9.3 20 MLT OPN (MISCELLANEOUS) ×1
BINDER BREAST LRG (GAUZE/BANDAGES/DRESSINGS) ×2 IMPLANT
BLADE SURG 10 STRL SS (BLADE) ×3 IMPLANT
BNDG COHESIVE 4X5 TAN STRL (GAUZE/BANDAGES/DRESSINGS) ×5 IMPLANT
CANISTER SUCT 3000ML PPV (MISCELLANEOUS) ×3 IMPLANT
CHLORAPREP W/TINT 26ML (MISCELLANEOUS) ×5 IMPLANT
CLIP APPLIE 9.375 MED OPEN (MISCELLANEOUS) ×1 IMPLANT
CLIP TI LARGE 6 (CLIP) ×2 IMPLANT
CLIP TI MEDIUM 6 (CLIP) ×2 IMPLANT
CLIP TI WIDE RED SMALL 6 (CLIP) ×2 IMPLANT
CONT SPEC 4OZ CLIKSEAL STRL BL (MISCELLANEOUS) ×12 IMPLANT
COVER MAYO STAND STRL (DRAPES) ×3 IMPLANT
COVER PROBE W GEL 5X96 (DRAPES) ×3 IMPLANT
COVER SURGICAL LIGHT HANDLE (MISCELLANEOUS) ×3 IMPLANT
DERMABOND ADVANCED (GAUZE/BANDAGES/DRESSINGS) ×2
DERMABOND ADVANCED .7 DNX12 (GAUZE/BANDAGES/DRESSINGS) ×1 IMPLANT
DEVICE DUBIN SPECIMEN MAMMOGRA (MISCELLANEOUS) ×4 IMPLANT
DRAPE CHEST BREAST 15X10 FENES (DRAPES) ×3 IMPLANT
DRAPE UNIVERSAL PACK (DRAPES) ×3 IMPLANT
DRAPE UTILITY XL STRL (DRAPES) ×6 IMPLANT
ELECT BLADE 4.0 EZ CLEAN MEGAD (MISCELLANEOUS) ×3
ELECT CAUTERY BLADE 6.4 (BLADE) ×3 IMPLANT
ELECT REM PT RETURN 9FT ADLT (ELECTROSURGICAL) ×3
ELECTRODE BLDE 4.0 EZ CLN MEGD (MISCELLANEOUS) IMPLANT
ELECTRODE REM PT RTRN 9FT ADLT (ELECTROSURGICAL) ×1 IMPLANT
GAUZE SPONGE 4X4 12PLY STRL (GAUZE/BANDAGES/DRESSINGS) ×2 IMPLANT
GLOVE BIO SURGEON STRL SZ 6 (GLOVE) ×3 IMPLANT
GLOVE BIOGEL PI IND STRL 6.5 (GLOVE) ×1 IMPLANT
GLOVE BIOGEL PI INDICATOR 6.5 (GLOVE) ×2
GOWN STRL REUS W/ TWL LRG LVL3 (GOWN DISPOSABLE) ×1 IMPLANT
GOWN STRL REUS W/TWL 2XL LVL3 (GOWN DISPOSABLE) ×3 IMPLANT
GOWN STRL REUS W/TWL LRG LVL3 (GOWN DISPOSABLE) ×6
KIT BASIN OR (CUSTOM PROCEDURE TRAY) ×3 IMPLANT
KIT MARKER MARGIN INK (KITS) ×5 IMPLANT
LIGHT WAVEGUIDE WIDE FLAT (MISCELLANEOUS) ×2 IMPLANT
NDL HYPO 25GX1X1/2 BEV (NEEDLE) ×1 IMPLANT
NEEDLE HYPO 25GX1X1/2 BEV (NEEDLE) ×3 IMPLANT
NS IRRIG 1000ML POUR BTL (IV SOLUTION) ×3 IMPLANT
PACK SURGICAL SETUP 50X90 (CUSTOM PROCEDURE TRAY) ×3 IMPLANT
PAD ABD 8X10 STRL (GAUZE/BANDAGES/DRESSINGS) ×2 IMPLANT
PENCIL BUTTON HOLSTER BLD 10FT (ELECTRODE) ×3 IMPLANT
SPONGE LAP 18X18 X RAY DECT (DISPOSABLE) ×5 IMPLANT
STOCKINETTE IMPERVIOUS 9X36 MD (GAUZE/BANDAGES/DRESSINGS) ×3 IMPLANT
SUT MNCRL AB 4-0 PS2 18 (SUTURE) ×7 IMPLANT
SUT VIC AB 2-0 SH 27 (SUTURE) ×3
SUT VIC AB 2-0 SH 27XBRD (SUTURE) IMPLANT
SUT VIC AB 3-0 SH 8-18 (SUTURE) ×3 IMPLANT
SYR BULB 3OZ (MISCELLANEOUS) ×3 IMPLANT
SYR CONTROL 10ML LL (SYRINGE) ×3 IMPLANT
TOWEL OR 17X24 6PK STRL BLUE (TOWEL DISPOSABLE) ×3 IMPLANT
TOWEL OR 17X26 10 PK STRL BLUE (TOWEL DISPOSABLE) ×3 IMPLANT
TUBE CONNECTING 12'X1/4 (SUCTIONS) ×1
TUBE CONNECTING 12X1/4 (SUCTIONS) ×2 IMPLANT
YANKAUER SUCT BULB TIP NO VENT (SUCTIONS) ×3 IMPLANT

## 2016-10-15 NOTE — Anesthesia Procedure Notes (Addendum)
Anesthesia Regional Block: Pectoralis block   Pre-Anesthetic Checklist: ,, timeout performed, Correct Patient, Correct Site, Correct Laterality, Correct Procedure, Correct Position, site marked, Risks and benefits discussed,  Surgical consent,  Pre-op evaluation,  At surgeon's request and post-op pain management  Laterality: Right  Prep: Maximum Sterile Barrier Precautions used, chloraprep       Needles:  Injection technique: Single-shot  Needle Type: Echogenic Stimulator Needle     Needle Length: 10cm      Additional Needles:   Procedures: ultrasound guided,,,,,,,,  Narrative:  Start time: 10/15/2016 10:38 AM End time: 10/15/2016 10:43 AM Injection made incrementally with aspirations every 5 mL.  Performed by: Personally  Anesthesiologist: [name]  Additional Notes: Risks, benefits and alternative to block explained extensively.  Patient tolerated procedure well, without complications.

## 2016-10-15 NOTE — Anesthesia Procedure Notes (Signed)
Procedure Name: Intubation Date/Time: 10/15/2016 11:48 AM Performed by: Willeen Cass P Pre-anesthesia Checklist: Patient identified, Emergency Drugs available, Suction available and Patient being monitored Patient Re-evaluated:Patient Re-evaluated prior to induction Oxygen Delivery Method: Circle System Utilized Preoxygenation: Pre-oxygenation with 100% oxygen Induction Type: IV induction Ventilation: Mask ventilation without difficulty and Oral airway inserted - appropriate to patient size Laryngoscope Size: Mac and 3 Grade View: Grade I Tube type: Oral Tube size: 7.0 mm Number of attempts: 1 Airway Equipment and Method: Stylet and Oral airway Placement Confirmation: ETT inserted through vocal cords under direct vision,  positive ETCO2 and breath sounds checked- equal and bilateral Secured at: 19 cm Tube secured with: Tape Dental Injury: Teeth and Oropharynx as per pre-operative assessment

## 2016-10-15 NOTE — Anesthesia Procedure Notes (Signed)
Procedures

## 2016-10-15 NOTE — H&P (Signed)
Julie Sosa 10/03/2016 9:38 AM Location: [facility] Patient #: [id number] DOB: 20-Nov-1935 Married / Language: English / Race: White Female   History of Present Illness Julie Klein MD; 10/03/2016 1:55 PM) The patient is a 81 year old female who presents with breast cancer. Patient is a 81 year old female who presents with screening detected bilateral breast cancer. The patient was found to have an abnormal bilateral mammogram. Diagnostic imaging showed a 5 mm area at 34 cm from the nipple and a 8 mm area at 133 cm from the nipple. There was also a elongated mass that was 2.4 cm in greatest dimension at 12:00. She had normal-appearing lymph nodes in her right. On the left breast, she had a 1 cm area 6 cm in the nipple at 3:30. She had abdominal lymph nodes on the left as well. She underwent core needle biopsy of 2 spots on the right as well as one spot on the left. The area at 12:30 and 1:30 rebiopsied on the right. The 12:30 lesion was invasive ductal carcinoma, grade 1 with DCIS. The 1:30 lesion was a fibroadenoma. The left-sided lesion was an invasive ductal carcinoma grade 1. Prognostic panel was done on the right breast cancer and was ER and PR strongly positive, HER-2 negative and Ki-67 of 2%.  Dx mammogram 08/26/16 FINDINGS: In the slightly upper inner quadrant of the right breast, middle depth there is a persistent area of distortion identified on the spot compression tomosynthesis images. In the left lower outer breast just anterior and inferior to the biopsy marking clip there is another persistent area possible distortion. After the initial portion of the examination, a scar marker was placed along the lateral scar of the left breast. Repeated images over this area demonstrate that the possible distortion in the left breast is immediately deep to the scar marker.  Mammographic images were processed with CAD.  No discrete palpable masses are identified in  the superior right lower in the lateral left breast on physical exam.  Ultrasound of the right breast at 12:30, 4 cm from the nipple demonstrates an irregular hypoechoic shadowing mass which is taller than wide measuring 5 x 3 x 5 mm. In the left breast at 1:30, 3 cm from the nipple there is a hypoechoic ill-defined superficial mass measuring 7 x 5 x 8 mm. At 12 o'clock, 2 cm from the nipple there is a hypoechoic irregular elongated mass measuring 2.4 x 0.5 x 1.1 cm. Ultrasound of the right axilla demonstrates multiple normal-appearing lymph nodes.  Ultrasound of the left breast at 330, 6 cm from the nipple demonstrates an irregular hypoechoic shadowing mass measuring 1.0 x 0.8 x 0.5 cm. Ultrasound of the left axilla demonstrates multiple normal-appearing lymph nodes.  Clip film on the right A ribbon shaped biopsy clip is satisfactorily positioned within the spiculated mass identified in the upper inner right breast anterior third. This clip placement does correspond to the suspicious area of concern detected in the right breast on the screening mammogram of 08/14/2016.  A wing shaped biopsy clip is satisfactorily positioned in the upper inner right breast, anterior third. This mass was identified sonographically. The 2 clips in the right breast are separated by approximately 3 cm.  A ribbon shaped biopsy clip is satisfactorily positioned in the far outer left breast, lower outer quadrant, 3:30 position, correspond to the mammographic area of concern on the recent screening mammogram.  IMPRESSION: Satisfactory position of ribbon shaped biopsy clip in the right breast, corresponding to  the spiculated mass identified on recent screening mammogram.  Satisfactory position of wing shaped biopsy clip in the upper inner quadrant of the right breast, in the expected location of the sonographically detected mass.  Satisfactory position of ribbon shaped biopsy clip in the  far lateral 3:30 position of the left breast  Pathology 09/09/16 Diagnosis 1. Breast, right, needle core biopsy, 12:30 4 cm fn INVASIVE DUCTAL CARCINOMA, GRADE 1 DUCTAL CARCINOMA IN SITU IS PRESENT 2. Breast, right, needle core biopsy, 1:30 3 cm fn FIBROADENOMA 3. Breast, left, needle core biopsy, 3:30 6 cm fn INVASIVE DUCTAL CARCINOMA, GRADE 1   Past Surgical History Dalbert Mayotte, [place]; 10/03/2016 9:38 AM) Breast Biopsy  Bilateral. Cataract Surgery  Bilateral. Gallbladder Surgery - Laparoscopic  Hip Surgery  Bilateral. Hysterectomy (not due to cancer) - Partial   Diagnostic Studies History Dalbert Mayotte, [place]; 10/03/2016 9:38 AM) Colonoscopy  >10 years ago Mammogram  within last year Pap Smear  >5 years ago  Allergies Dalbert Mayotte, CMA; 10/03/2016 9:39 AM) Cefuroxime Axetil *CEPHALOSPORINS*  Allergies Reconciled   Medication History Dalbert Mayotte, CMA; 10/03/2016 9:41 AM) Atenolol (Oral) Specific strength unknown - Active. Potassium Chloride (Intravenous) Specific strength unknown - Active. Requip (Oral) Specific strength unknown - Active. Zoloft (Oral) Specific strength unknown - Active. Zocor (Oral) Specific strength unknown - Active. TraZODone HCl (100MG Tablet, Oral) Active. Medications Reconciled  Social History Dalbert Mayotte, [place]; 10/03/2016 9:38 AM) Caffeine use  Carbonated beverages, Tea. No alcohol use  No drug use  Tobacco use  Never smoker.  Family History Dalbert Mayotte, [place]; 10/03/2016 9:38 AM) Arthritis  Mother. Breast Cancer  Sister. Depression  Mother. Heart Disease  Mother. Heart disease in female family member before age 79  Hypertension  Mother. Migraine Headache  Daughter. Respiratory Condition  Brother, Father.  Pregnancy / Birth History Dalbert Mayotte, [place]; 10/03/2016 9:38 AM) Age at menarche  87 years. Gravida  2 Maternal age  19-25 Para  2  Other Problems Dalbert Mayotte, [place]; 10/03/2016 9:38  AM) Anxiety Disorder  Arthritis  Gastroesophageal Reflux Disease  Hemorrhoids  High blood pressure  Melanoma     Review of Systems Dalbert Mayotte CMA; 10/03/2016 9:38 AM) General Present- Fatigue. Not Present- Appetite Loss, Chills, Fever, Night Sweats, Weight Gain and Weight Loss. Skin Present- Dryness. Not Present- Change in Wart/Mole, Hives, Jaundice, New Lesions, Non-Healing Wounds, Rash and Ulcer. HEENT Present- Ringing in the Ears, Seasonal Allergies and Wears glasses/contact lenses. Not Present- Earache, Hearing Loss, Hoarseness, Nose Bleed, Oral Ulcers, Sinus Pain, Sore Throat, Visual Disturbances and Yellow Eyes. Respiratory Present- Snoring. Not Present- Bloody sputum, Chronic Cough, Difficulty Breathing and Wheezing. Breast Present- Breast Mass. Not Present- Breast Pain, Nipple Discharge and Skin Changes. Cardiovascular Present- Swelling of Extremities. Not Present- Chest Pain, Difficulty Breathing Lying Down, Leg Cramps, Palpitations, Rapid Heart Rate and Shortness of Breath. Gastrointestinal Present- Hemorrhoids. Not Present- Abdominal Pain, Bloating, Bloody Stool, Change in Bowel Habits, Chronic diarrhea, Constipation, Difficulty Swallowing, Excessive gas, Gets full quickly at meals, Indigestion, Nausea, Rectal Pain and Vomiting. Female Genitourinary Present- Frequency and Urgency. Not Present- Nocturia, Painful Urination and Pelvic Pain. Neurological Present- Weakness. Not Present- Decreased Memory, Fainting, Headaches, Numbness, Seizures, Tingling, Tremor and Trouble walking. Psychiatric Present- Anxiety. Not Present- Bipolar, Change in Sleep Pattern, Depression, Fearful and Frequent crying. Endocrine Present- Hair Changes. Not Present- Cold Intolerance, Excessive Hunger, Heat Intolerance, Hot flashes and New Diabetes. Hematology Not Present- Blood Thinners, Easy Bruising, Excessive bleeding, Gland problems, HIV and Persistent Infections.  Vitals Dalbert Mayotte CMA;  10/03/2016 9:42 AM) 10/03/2016 9:41 AM Weight: 169.8 lb Height: 63in Body Surface Area: 1.8 m Body Mass Index: 30.08 kg/m  Temp.: 97.88F  Pulse: 58 (Regular)  BP: 120/80 (Sitting, Left Arm, Standard)       Physical Exam Julie Klein MD; 10/03/2016 1:56 PM) General Mental Status-Alert. General Appearance-Consistent with stated age. Hydration-Well hydrated. Voice-Normal.  Head and Neck Head-normocephalic, atraumatic with no lesions or palpable masses. Trachea-midline. Thyroid Gland Characteristics - normal size and consistency.  Eye Eyeball - Bilateral-Extraocular movements intact. Sclera/Conjunctiva - Bilateral-No scleral icterus.  Chest and Lung Exam Chest and lung exam reveals -quiet, even and easy respiratory effort with no use of accessory muscles and on auscultation, normal breath sounds, no adventitious sounds and normal vocal resonance. Inspection Chest Wall - Normal. Back - normal.  Breast Note: No palpable mass in either breast. No significant biopsy hematoma. She has no skin dimpling or nipple retraction. There is no nipple discharge. His axillary, infraclavicular, supraclavicular, cervical lymphadenopathy. Breast are reasonably symmetric and ptotic bilaterally.   Cardiovascular Cardiovascular examination reveals -normal heart sounds, regular rate and rhythm with no murmurs and normal pedal pulses bilaterally.  Abdomen Inspection Inspection of the abdomen reveals - No Hernias. Palpation/Percussion Palpation and Percussion of the abdomen reveal - Soft, Non Tender, No Rebound tenderness, No Rigidity (guarding) and No hepatosplenomegaly. Auscultation Auscultation of the abdomen reveals - Bowel sounds normal.  Neurologic Neurologic evaluation reveals -alert and oriented x 3 with no impairment of recent or remote memory. Mental Status-Normal.  Musculoskeletal Global Assessment -Note: no gross deformities.  Normal Exam  - Left-Upper Extremity Strength Normal and Lower Extremity Strength Normal. Normal Exam - Right-Upper Extremity Strength Normal and Lower Extremity Strength Normal.  Lymphatic Head & Neck  General Head & Neck Lymphatics: Bilateral - Description - Normal. Axillary  General Axillary Region: Bilateral - Description - Normal. Tenderness - Non Tender. Femoral & Inguinal  Generalized Femoral & Inguinal Lymphatics: Bilateral - Description - No Generalized lymphadenopathy.    Assessment & Plan Julie Klein MD; 10/03/2016 2:00 PM) PRIMARY CANCER OF UPPER INNER QUADRANT OF RIGHT FEMALE BREAST (C50.211) Impression: We will do a seed localized lumpectomy on the right breast. Given the proximity of the cancer to the fibroadenoma, we will likely just remove this. I will discuss this with radiology. We will get the other lesion biopsied that was indeterminate. This is felt to be lower risk, but now that she has had cancer of the pretest probability is increased. This might potentially change her surgical plan.  We discussed getting an MRI but the patient and I agree that this may lead to additional biopsies that might not be necessary.  The patient has had a muscle strain on the right from a fall she sustained. I will send her for a preoperative physical therapy as she will be at high-risk for frozen shoulder if we don't get the shoulder mobilized well.  I reviewed the timing of surgery as well as the risks and benefits. I discussed the risk of bleeding and infection. I reviewed that she may need additional surgery or procedures if she has positive margins or seroma. She understands and wishes to proceed. This set up as soon as possible.  I'll get the biopsies set up next week for the third lesion in her right breast. We'll go ahead and get a surgical spot for her as this would not significantly change her surgical operative time.  I reviewed surgery with the patient. I discussed that she would have  an incision on each breast as well as one in her axilla. Based on her age as well as a low grade of her tumors, she will likely not receive radiation. She would be recommended to get anti-hormone pill. I will refer for discussion with radiation as well as oncology. Current Plans Referred to Physical Therapy, for evaluation and follow up (Physical Therapy). Routine. ABNORMAL MAMMOGRAM OF RIGHT BREAST (R92.8) Impression: See above, she will need a biopsy of this lesion. If this is a malignancy, I'll need to be excised. Current Plans US GUIDED BIOPSY BREAST RT (46047) PRIMARY CANCER OF LOWER OUTER QUADRANT OF LEFT FEMALE BREAST (C50.512) Impression: Treatment described as above with BCT. We have called for a prognostic pounds and be performed on the left side as well. This was not done previously. Current Plans Pt Education - [facility] Breast Cancer Information Given - [facility] "Breast Journey" Package Pt Education - flb breast cancer surgery: discussed with patient and provided information. Referred to Radiation Oncology, for evaluation and follow up (Radiation Oncology). Routine. Referred to Oncology, for evaluation and follow up (Oncology). Routine.   Signed by Julie Klein, MD (10/03/2016 2:01 PM)

## 2016-10-15 NOTE — Discharge Instructions (Signed)
[facility] °Office Phone Number [phone] ° °BREAST BIOPSY/ PARTIAL MASTECTOMY: POST OP INSTRUCTIONS ° °Always review your discharge instruction sheet given to you by the facility where your surgery was performed. ° °IF YOU HAVE DISABILITY OR FAMILY LEAVE FORMS, YOU MUST BRING THEM TO THE OFFICE FOR PROCESSING.  DO NOT GIVE THEM TO YOUR DOCTOR. ° °1. A prescription for pain medication may be given to you upon discharge.  Take your pain medication as prescribed, if needed.  If narcotic pain medicine is not needed, then you may take acetaminophen (Tylenol) or ibuprofen (Advil) as needed. °2. Take your usually prescribed medications unless otherwise directed °3. If you need a refill on your pain medication, please contact your pharmacy.  They will contact our office to request authorization.  Prescriptions will not be filled after 5pm or on week-ends. °4. You should eat very light the first 24 hours after surgery, such as soup, crackers, pudding, etc.  Resume your normal diet the day after surgery. °5. Most patients will experience some swelling and bruising in the breast.  Ice packs and a good support bra will help.  Swelling and bruising can take several days to resolve.  °6. It is common to experience some constipation if taking pain medication after surgery.  Increasing fluid intake and taking a stool softener will usually help or prevent this problem from occurring.  A mild laxative (Milk of Magnesia or Miralax) should be taken according to package directions if there are no bowel movements after 48 hours. °7. Unless discharge instructions indicate otherwise, you may remove your bandages 48 hours after surgery, and you may shower at that time.  You may have steri-strips (small skin tapes) in place directly over the incision.  These strips should be left on the skin for 7-10 days.   Any sutures or staples will be removed at the office during your follow-up visit. °8. ACTIVITIES:  You may resume  regular daily activities (gradually increasing) beginning the next day.  Wearing a good support bra or sports bra (or the breast binder) minimizes pain and swelling.  You may have sexual intercourse when it is comfortable. °a. You may drive when you no longer are taking prescription pain medication, you can comfortably wear a seatbelt, and you can safely maneuver your car and apply brakes. °b. RETURN TO WORK:  __________1 week_______________ °9. You should see your doctor in the office for a follow-up appointment approximately two weeks after your surgery.  Your doctor’s nurse will typically make your follow-up appointment when she calls you with your pathology report.  Expect your pathology report 2-3 business days after your surgery.  You may call to check if you do not hear from us after three days. ° ° °WHEN TO CALL YOUR DOCTOR: °1. Fever over 101.0 °2. Nausea and/or vomiting. °3. Extreme swelling or bruising. °4. Continued bleeding from incision. °5. Increased pain, redness, or drainage from the incision. ° °The clinic staff is available to answer your questions during regular business hours.  Please don’t hesitate to call and ask to speak to one of the nurses for clinical concerns.  If you have a medical emergency, go to the nearest emergency room or call 911.  A surgeon from [facility] is always on call at the hospital. ° °For further questions, please visit [email or web address]  ° °

## 2016-10-15 NOTE — Anesthesia Preprocedure Evaluation (Signed)
Anesthesia Evaluation  Patient identified by MRN, date of birth, ID band Patient awake    Reviewed: Allergy & Precautions, NPO status , Patient's Chart, lab work & pertinent test results  Airway Mallampati: II  TM Distance: >3 FB Neck ROM: Full    Dental no notable dental hx.    Pulmonary neg pulmonary ROS,    Pulmonary exam normal breath sounds clear to auscultation       Cardiovascular hypertension, Normal cardiovascular exam Rhythm:Regular Rate:Normal     Neuro/Psych negative neurological ROS  negative psych ROS   GI/Hepatic negative GI ROS, Neg liver ROS,   Endo/Other  negative endocrine ROS  Renal/GU negative Renal ROS  negative genitourinary   Musculoskeletal negative musculoskeletal ROS (+)   Abdominal   Peds negative pediatric ROS (+)  Hematology negative hematology ROS (+)   Anesthesia Other Findings   Reproductive/Obstetrics negative OB ROS                             Anesthesia Physical Anesthesia Plan  ASA: II  Anesthesia Plan: General   Post-op Pain Management:  Regional for Post-op pain   Induction: Intravenous  PONV Risk Score and Plan: 3 and Ondansetron, Dexamethasone, Propofol and Treatment may vary due to age or medical condition  Airway Management Planned: LMA and Oral ETT  Additional Equipment:   Intra-op Plan:   Post-operative Plan: Extubation in OR  Informed Consent: I have reviewed the patients History and Physical, chart, labs and discussed the procedure including the risks, benefits and alternatives for the proposed anesthesia with the patient or authorized representative who has indicated his/her understanding and acceptance.   Dental advisory given  Plan Discussed with: CRNA  Anesthesia Plan Comments: (Bil PECS blocks)        Anesthesia Quick Evaluation

## 2016-10-15 NOTE — Anesthesia Postprocedure Evaluation (Signed)
Anesthesia Post Note  Patient: Julie Sosa  Procedure(s) Performed: Procedure(s) (LRB): RIGHT BREAST LUMPECTOMY WITH BRACKETED RADIOACTIVE SEEDS AND RIGHT SENTINEL LYMPH NODE BIOPSY, LEFT BREAST LUMPECTOMY WITH RADIOACTIVE SEED AND LEFT SENTINEL LYMPH NODE BIOPSY (Bilateral)     Patient location during evaluation: PACU Anesthesia Type: General and Regional Level of consciousness: awake and alert Pain management: pain level controlled Vital Signs Assessment: post-procedure vital signs reviewed and stable Respiratory status: spontaneous breathing, nonlabored ventilation, respiratory function stable and patient connected to nasal cannula oxygen Cardiovascular status: blood pressure returned to baseline and stable Postop Assessment: no signs of nausea or vomiting Anesthetic complications: no    Last Vitals:  Vitals:   10/15/16 1035 10/15/16 1428  BP: (!) 113/55 (!) 183/69  Pulse: (!) 54   Resp: 16   Temp:  36.5 C    Last Pain:  Vitals:   10/15/16 1428  PainSc: 0-No pain                 [name]

## 2016-10-15 NOTE — Anesthesia Procedure Notes (Signed)
Anesthesia Regional Block: Pectoralis block   Pre-Anesthetic Checklist: ,, timeout performed, Correct Patient, Correct Site, Correct Laterality, Correct Procedure, Correct Position, site marked, Risks and benefits discussed,  Surgical consent,  Pre-op evaluation,  At surgeon's request and post-op pain management  Laterality: Left  Prep: Maximum Sterile Barrier Precautions used, chloraprep       Needles:  Injection technique: Single-shot  Needle Type: Echogenic Stimulator Needle     Needle Length: 10cm      Additional Needles:   Procedures: ultrasound guided,,,,,,,,  Narrative:  Start time: 10/15/2016 11:13 AM End time: 10/15/2016 11:18 AM Injection made incrementally with aspirations every 5 mL.  Performed by: Personally  Anesthesiologist: [name]  Additional Notes: Risks, benefits and alternative to block explained extensively.  Patient tolerated procedure well, without complications.

## 2016-10-15 NOTE — Transfer of Care (Signed)
Immediate Anesthesia Transfer of Care Note  Patient: Julie Sosa  Procedure(s) Performed: Procedure(s): RIGHT BREAST LUMPECTOMY WITH BRACKETED RADIOACTIVE SEEDS AND RIGHT SENTINEL LYMPH NODE BIOPSY, LEFT BREAST LUMPECTOMY WITH RADIOACTIVE SEED AND LEFT SENTINEL LYMPH NODE BIOPSY (Bilateral)  Patient Location: PACU  Anesthesia Type:General  Level of Consciousness: awake, alert , oriented and patient cooperative  Airway & Oxygen Therapy: Patient Spontanous Breathing and Patient connected to nasal cannula oxygen  Post-op Assessment: Report given to RN and Post -op Vital signs reviewed and stable  Post vital signs: Reviewed and stable  Last Vitals:  Vitals:   10/15/16 1030 10/15/16 1035  BP: (!) 112/48 (!) 113/55  Pulse: (!) 59 (!) 54  Resp: 11 16  Temp:      Last Pain: There were no vitals filed for this visit.       Complications: No apparent anesthesia complications

## 2016-10-15 NOTE — Op Note (Signed)
Right Breast Bracketed Radioactive seed localized lumpectomy and sentinel lymph node mapping and biopsy, Left seed localized lumpectomy with sentinel lymph node biopsy  Indications: This patient presents with history of bilateral breast cancer, both cT1N0  Pre-operative Diagnosis: Right breast cancer, upper inner quadrant, cT1N0; Left breast cancer, lower outer quadrant, cT1N0  Post-operative Diagnosis: Same  Surgeon: [name]   Anesthesia: General endotracheal anesthesia  ASA Class: 2  Procedure Details  The patient was seen in the Holding Room. The risks, benefits, complications, treatment options, and expected outcomes were discussed with the patient. The possibilities of bleeding, infection, the need for additional procedures, failure to diagnose a condition, and creating a complication requiring transfusion or operation were discussed with the patient. The patient concurred with the proposed plan, giving informed consent.  The site of surgery properly noted/marked. The patient was taken to Operating Room # 9, identified, and the procedure verified as Right Breast bracketed seed localized Lumpectomy and left seed localized lumpectomy with sentinel lymph node biopsy . A Time Out was held and the above information confirmed.  The right axilla did not have a strong Tc signal, so methylene blue was injected in the right breast in the subareolar location.    The bilateral arm, breast, and chest were prepped and draped in standard fashion. The right breast was addressed first.  The lumpectomy was performed by creating a circumareolar incision over the superior nipple just below the previously placed radioactive seed.  Dissection was carried down to around the point of maximum signal intensity. The cautery was used to perform the dissection.  Hemostasis was achieved with cautery. The edges of the cavity were marked with large clips, with one each medial, lateral, inferior and superior, and two  clips posteriorly.   The specimen was inked with the margin marker paint kit.    Specimen radiography confirmed inclusion of the mammographic lesion, the clip, and the seed.  The background signal in the breast was zero.  The wound was irrigated and closed with 3-0 vicryl in layers and 4-0 monocryl subcuticular suture.  The anterior margin is skin on the right.    The left breast was addressed next.  The cancer was just inferior to an old scar on the breast.  This was extended inferiorly and the lumpectomy on the left was performed similarly.    Using a hand-held gamma probe, left axillary sentinel nodes were identified transcutaneously.  An oblique incision was created below the axillary hairline.  Dissection was carried through the clavipectoral fascia.  Three deep level two axillary sentinel nodes were removed.  Counts per second were 800, 400, and 50.    The background count was 5 cps.  The wound was irrigated.  Hemostasis was achieved with cautery.  The axillary incision was closed with a 3-0 vicryl deep dermal interrupted sutures and a 4-0 monocryl subcuticular closure.    The right axilla was addressed similarly.  Two deep level two nodes were taken on the right.  Cps were 50 and 220.    Sterile dressings were applied. At the end of the operation, all sponge, instrument, and needle counts were correct.  Findings: grossly clear surgical margins and no adenopathy  Estimated Blood Loss:  min         Specimens: R breast lumpectomy with two seeds, left breast lumpectomy with one seed, three left axillary sentinel lymph nodes, and two right axillary sentinel lymph nodes.             Complications:  None; patient tolerated the procedure well.         Disposition: PACU - hemodynamically stable.         Condition: stable

## 2016-10-15 NOTE — Interval H&P Note (Signed)
History and Physical Interval Note:  10/15/2016 11:30 AM  [place]  has presented today for surgery, with the diagnosis of BILATERAL BREAST CANCER  The various methods of treatment have been discussed with the patient and family. After consideration of risks, benefits and other options for treatment, the patient has consented to  Procedure(s): RIGHT BREAST LUMPECTOMY WITH BRACKETED RADIOACTIVE SEEDS AND RIGHT SENTINEL LYMPH NODE BIOPSY, LEFT BREAST LUMPECTOMY WITH RADIOACTIVE SEED AND LEFT SENTINEL LYMPH NODE BIOPSY (Bilateral) as a surgical intervention .  The patient's history has been reviewed, patient examined, no change in status, stable for surgery.  I have reviewed the patient's chart and labs.  Questions were answered to the patient's satisfaction.     Annastasia Haskins

## 2016-10-16 ENCOUNTER — Encounter: Payer: Self-pay | Admitting: General Surgery

## 2016-10-17 ENCOUNTER — Encounter (HOSPITAL_COMMUNITY): Payer: Self-pay | Admitting: General Surgery

## 2016-10-20 ENCOUNTER — Encounter: Payer: Self-pay | Admitting: Radiation Oncology

## 2016-10-20 NOTE — Progress Notes (Signed)
Location of Breast Cancer: Right Breast and Left Breast.   Histology per Pathology Report:  09/09/16 Diagnosis 1. Breast, right, needle core biopsy, 12:30 4 cm fn INVASIVE DUCTAL CARCINOMA, GRADE 1 DUCTAL CARCINOMA IN SITU IS PRESENT  Receptor Status ER (100%), PR (90%), Her2-neu (NEG), Ki- (2%)  2. Breast, right, needle core biopsy, 1:30 3 cm fn FIBROADENOMA 3. Breast, left, needle core biopsy, 3:30 6 cm fn INVASIVE DUCTAL CARCINOMA, GRADE 1  Receptor Status: ER(100%), PR (100%), Her2-neu (NEG), Ki-(10%)  10/15/16 Diagnosis 1. Breast, lumpectomy, Right w/ bracketed seed - INVASIVE DUCTAL CARCINOMA, 0.3 CM, MSBR GRADE 1. - MARGINS NOT INVOLVED. - TWO FIBROADENOMAS WITH CALCIFICATIONS. - PREVIOUS BIOPSY SITES AND CLIPS. 2. Breast, lumpectomy, Left w/seed - INVASIVE DUCTAL CARCINOMA, 1 CM, MSBR GRADE 1. - DUCTAL CARCINOMA IN SITU. - MARGINS NOT INVOLVED. - CLOSEST MARGIN INFERIOR AT 0.15 CM. - PREVIOUS BIOPSY SITE AND CLIPS. 3. Lymph node, sentinel, biopsy, Left axillary #1 - ONE BENIGN LYMPH NODE (0/1). 4. Lymph node, sentinel, biopsy, Left axillary #2 - ONE BENIGN LYMPH NODE (0/1). 5. Lymph node, sentinel, biopsy, Left axillary #3 - BENIGN ADIPOSE TISSUE. - NO LYMPH NODE TISSUE OR MALIGNANCY. 6. Lymph node, sentinel, biopsy, Right axillary #1 - ONE BENIGN LYMPH NODE (0/1). 7. Lymph node, sentinel, biopsy, Right axillary 32 - ONE BENIGN LYMPH NODE (0/1). 8. Lymph node, sentinel, biopsy, Right axillary - ONE BENIGN LYMPH NODE (0/1) 9. Lymph node, sentinel, biopsy, Right axillary - ONE BENIGN LYMPH NODE (0/1). 10. Lymph node, sentinel, biopsy, Right axillary - ONE BENIGN LYMPH NODE (0/1).  Did patient present with symptoms or was this found on screening mammography?: It was found on a screening mammogram.   Past/Anticipated interventions by surgeon, if any: 10/15/16 Right Breast Bracketed Radioactive seed localized lumpectomy and sentinel lymph node mapping and biopsy,  Left seed localized lumpectomy with sentinel lymph node biopsy Dr. Barry Dienes  Past/Anticipated interventions by medical oncology, if any:  Dr. Burr Medico 10/23/16 PLAN - order bone density scan - radiation appointment with Dr. Isidore Moos next week. -I plan to see her back after she completes adjuvant radiation, or sooner if radiation is not recommended  Lymphedema issues, if any:  She denies swelling in her bilateral arms. She did have some swelling to her incision site, which she is using ice over.   Pain issues, if any:  She denies. She has occasional discomfort to her Bilateral incision sites.   SAFETY ISSUES:  Prior radiation? No  Pacemaker/ICD? No  Possible current pregnancy? No  Is the patient on methotrexate? No  Current Complaints / other details:   She is concerned about radiation skin side effects. She had a sister who received radiation and had severe skin side effects and she is very concerned about what she may experience.   BP 120/80   Pulse 61   Temp 98.4 F (36.9 C)   Ht 5' 3.75" (1.619 m)   Wt 170 lb 6.4 oz (77.3 kg)   SpO2 98% Comment: room air  BMI 29.48 kg/m    Wt Readings from Last 3 Encounters:  10/28/16 170 lb 6.4 oz (77.3 kg)  10/23/16 170 lb 14.4 oz (77.5 kg)  10/10/16 168 lb 10.4 oz (76.5 kg)      Laiken Nohr, Stephani Police, RN 10/20/2016,3:50 PM

## 2016-10-22 NOTE — Progress Notes (Addendum)
[place]  [phone] Fax:([phone] [place] Note   Patient Care Team: [name], MD as PCP - General (Internal Medicine) 10/23/2016  Referred Physician: Dr. Barry Dienes  CHIEF COMPLAINTS/PURPOSE OF CONSULTATION:  Bilateral breast cancer   Oncology History   Cancer Staging Bilateral breast cancer [facility]) Staging form: Breast, AJCC 8th Edition - Clinical: No stage assigned - Unsigned  Breast cancer of lower-outer quadrant of left female breast [facility]) Staging form: Breast, AJCC 8th Edition - Pathologic stage from 10/15/2016: Stage IA (pT1b, pN0, cM0, G1, ER: Positive, PR: Positive, HER2: Negative) - Signed by Truitt Merle, MD on 10/23/2016  Breast cancer of upper-inner quadrant of right female breast [facility]) Staging form: Breast, AJCC 8th Edition - Pathologic stage from 10/15/2016: Stage IA (pT1a, pN0, cM0, G1, ER: Positive, PR: Positive, HER2: Negative) - Signed by Truitt Merle, MD on 10/23/2016       Bilateral breast cancer ([place])   08/14/2016 Mammogram    IMPRESSION: Further evaluation is suggested for possible distortion in the right breast.  Further evaluation is suggested for possible distortion in the left breast.      08/26/2016 Mammogram    IMPRESSION: 1. There is a suspicious mass in the right breast at 12:30, which is favored to correspond with the distortion identified mammographically.  2.  There is a suspicious mass in the right breast at 1:30.  3. There is an indeterminate elongated mass in the right breast at 12 o'clock.  4. There is an irregular mass in the left breast at 3:30 which is indeterminate. This could be related to the patient's prior surgery, however malignancy cannot be excluded.  5.  No evidence of bilateral lymphadenopathy.      09/09/2016 Pathology Results    Diagnosis 1. Breast, right, needle core biopsy, 12:30 4 cm fn INVASIVE DUCTAL CARCINOMA, GRADE 1 DUCTAL CARCINOMA IN SITU IS PRESENT 2.  Breast, right, needle core biopsy, 1:30 3 cm fn FIBROADENOMA 3. Breast, left, needle core biopsy, 3:30 6 cm fn INVASIVE DUCTAL CARCINOMA, GRADE 1      09/09/2016 Initial Diagnosis    Bilateral breast cancer ([place])      09/09/2016 Receptors her2    Left breast cancer ER 100%+, PR 100%+, HER2-, Ki67 10% Right breast cancer ER 100%+, PR 90%+, HER2-, Ki67 2%      10/07/2016 Pathology Results    Diagnosis Breast, right, needle core biopsy, 12:00 o'clock - FIBROADENOMA. - THERE IS NO EVIDENCE OF MALIGNANCY. - SEE COMMENT.      10/15/2016 Pathology Results    Diagnosis 1. Breast, lumpectomy, Right w/ bracketed seed - INVASIVE DUCTAL CARCINOMA, 0.3 CM, MSBR GRADE 1. - MARGINS NOT INVOLVED. - TWO FIBROADENOMAS WITH CALCIFICATIONS. - PREVIOUS BIOPSY SITES AND CLIPS. 2. Breast, lumpectomy, Left w/seed - INVASIVE DUCTAL CARCINOMA, 1 CM, MSBR GRADE 1. - DUCTAL CARCINOMA IN SITU. - MARGINS NOT INVOLVED. - CLOSEST MARGIN INFERIOR AT 0.15 CM. - PREVIOUS BIOPSY SITE AND CLIPS. 3. Lymph node, sentinel, biopsy, Left axillary #1 - ONE BENIGN LYMPH NODE (0/1). 4. Lymph node, sentinel, biopsy, Left axillary #2 - ONE BENIGN LYMPH NODE (0/1). 5. Lymph node, sentinel, biopsy, Left axillary #3 - BENIGN ADIPOSE TISSUE. - NO LYMPH NODE TISSUE OR MALIGNANCY. 6. Lymph node, sentinel, biopsy, Right axillary #1 - ONE BENIGN LYMPH NODE (0/1). 7. Lymph node, sentinel, biopsy, Right axillary 32 - ONE BENIGN LYMPH NODE (0/1). 8. Lymph node, sentinel, biopsy, Right axillary - ONE BENIGN LYMPH NODE (0/1) 9. Lymph node,  sentinel, biopsy, Right axillary - ONE BENIGN LYMPH NODE (0/1). 10. Lymph node, sentinel, biopsy, Right axillary - ONE BENIGN LYMPH NODE (0/1).      10/15/2016 Surgery    Patient recived a bilateral breast lumpectomy performed by Dr. Barry Dienes       Breast cancer of lower-outer quadrant of left female breast ([place])   10/23/2016 Initial Diagnosis    Breast cancer of lower-outer quadrant of  left female breast [facility])      Breast cancer of upper-inner quadrant of right female breast ([place])   10/23/2016 Initial Diagnosis    Breast cancer of upper-inner quadrant of right female breast ([place])       HISTORY OF PRESENTING ILLNESS:  Julie Sosa 81 y.o. female is here because of newly diagnosed bilateral breast cancer. She was referred by her breast surgeon Dr. Barry Dienes.  Patient undergo a screening mammogram on 08/14/2016 with showed abnormality in both breasts. She has had previous abnormal mammograms that were reported benign. Diagnostic imaging showed a 5 mm area at 34 cm from the nipple and a 8 mm area at 133 cm from the nipple. She had normal-appearing lymph nodes in her right breast and on her left, she had a 2 cm area 6cm in the nipple at 3:30 as well as abnormal lymph nodes. She subsequently underwent core needle biopsy of 2 spots on the right. One showed invasive, ductal carcinoma, grade 1 with DCIS and the other was fibroadenoma. Biopsy of the left-sided lesion showed invasive ductal carcinoma grade 1. Prognostic panel was done on the right breast cancer and was ER and PR strongly positive, HER-2 negative and Ki-67 of 2%.  Before surgery, she denied any unexpected weight lost. She did have some weight loss before due to her bilateral hip surgery on 3/19.  She is here today to discuss preventive care accompanied by her husband. She has some pain in her right breast cancer where her lymph nodes were removed. She uses ice packs and tighter bras to help manage the pain.  MEDICAL HISTORY:  Past Medical History:  Diagnosis Date  . Anemia   . Anxiety   . Arthritis    osteoarthritis  . Cancer ([place])    melanoma right arm  . Depression   . GERD (gastroesophageal reflux disease)    occasionally uses tums or rolaids  . Headache   . Hypercholesteremia   . Hypertension   . Restless leg syndrome     SURGICAL HISTORY: Past Surgical History:  Procedure Laterality Date  .  ABDOMINAL HYSTERECTOMY    . BREAST LUMPECTOMY WITH RADIOACTIVE SEED AND SENTINEL LYMPH NODE BIOPSY Bilateral 10/15/2016   Procedure: RIGHT BREAST LUMPECTOMY WITH BRACKETED RADIOACTIVE SEEDS AND RIGHT SENTINEL LYMPH NODE BIOPSY, LEFT BREAST LUMPECTOMY WITH RADIOACTIVE SEED AND LEFT SENTINEL LYMPH NODE BIOPSY;  Surgeon: [name], MD;  Location: [facility];  Service: General;  Laterality: Bilateral;  . BREAST SURGERY     left breast cyst removed  . CATARACT EXTRACTION W/PHACO  03/11/2012   Procedure: CATARACT EXTRACTION PHACO AND INTRAOCULAR LENS PLACEMENT (IOC);  Surgeon: Tonny Branch, MD;  Location: [facility];  Service: Ophthalmology;  Laterality: Right;  CDE:17.71  . CATARACT EXTRACTION W/PHACO  03/29/2012   Procedure: CATARACT EXTRACTION PHACO AND INTRAOCULAR LENS PLACEMENT (IOC);  Surgeon: Tonny Branch, MD;  Location: [facility];  Service: Ophthalmology;  Laterality: Left;  CDE:17.49  . CHOLECYSTECTOMY  10 yrs ago   Arnoldo Morale  . EYE SURGERY Bilateral    cataract extraction  . MUSCLE  BIOPSY     removal of cyst-right calf  . TOTAL HIP ARTHROPLASTY Left 12/25/2014   Procedure: TOTAL HIP ARTHROPLASTY;  Surgeon: Dereck Leep, MD;  Location: [facility];  Service: Orthopedics;  Laterality: Left;  . TOTAL HIP ARTHROPLASTY Right 06/23/2016   Procedure: TOTAL HIP ARTHROPLASTY;  Surgeon: Dereck Leep, MD;  Location: [facility];  Service: Orthopedics;  Laterality: Right;  . YAG LASER APPLICATION Right 1/61/0960   Procedure: YAG LASER APPLICATION;  Surgeon: Williams Che, MD;  Location: [facility];  Service: Ophthalmology;  Laterality: Right;  . YAG LASER APPLICATION Left 4/54/0981   Procedure: YAG LASER APPLICATION;  Surgeon: Williams Che, MD;  Location: [facility];  Service: Ophthalmology;  Laterality: Left;    SOCIAL HISTORY: Social History   Social History  . Marital status: Married    Spouse name: N/A  . Number of children: N/A  . Years of education: N/A   Occupational History  . Not on file.   Social  History Main Topics  . Smoking status: Never Smoker  . Smokeless tobacco: Never Used  . Alcohol use No  . Drug use: No  . Sexual activity: Yes    Birth control/ protection: Surgical   Other Topics Concern  . Not on file   Social History Narrative  . No narrative on file    FAMILY HISTORY: Family History  Problem Relation Age of Onset  . Cancer Brother        lung cancer   . Cancer Other 30       breast cancer   . Cancer Sister 42       breast cancer     ALLERGIES:  is allergic to cefuroxime.  MEDICATIONS:  Current Outpatient Prescriptions  Medication Sig Dispense Refill  . atenolol (TENORMIN) 50 MG tablet Take 50 mg by mouth daily. In am.    . hydrochlorothiazide (HYDRODIURIL) 25 MG tablet Take 25 mg by mouth daily. In am.    . oxyCODONE (OXY IR/ROXICODONE) 5 MG immediate release tablet Take 1-2 tablets (5-10 mg total) by mouth every 4 (four) hours as needed for severe pain. 20 tablet 0  . potassium chloride SA (K-DUR,KLOR-CON) 20 MEQ tablet Take 20-40 mEq by mouth 2 (two) times daily. 40 MEQ in the morning & 20 MEQ at night after supper    . rOPINIRole (REQUIP) 1 MG tablet Take 1 mg by mouth daily at 8 pm.     . sertraline (ZOLOFT) 100 MG tablet Take 100 mg by mouth daily. In am.    . simvastatin (ZOCOR) 20 MG tablet Take 20 mg by mouth daily. In am.    . traMADol (ULTRAM) 50 MG tablet Take 1-2 tablets (50-100 mg total) by mouth every 4 (four) hours as needed for moderate pain. (Patient not taking: Reported on 10/06/2016) 60 tablet 0  . traZODone (DESYREL) 100 MG tablet Take 100 mg by mouth at bedtime.   2   No current facility-administered medications for this visit.    GYN HISTORY  Menarchal: 14 LMP: 31 Contraceptive: HRT:  GP:2  REVIEW OF SYSTEMS:   Constitutional: Denies fevers, chills or abnormal night sweats Eyes: Denies blurriness of vision, double vision or watery eyes Ears, nose, mouth, throat, and face: Denies mucositis or sore throat Respiratory: Denies  cough, dyspnea or wheezes Cardiovascular: Denies palpitation, chest discomfort or lower extremity swelling Gastrointestinal:  Denies nausea, heartburn or change in bowel habits Skin: Denies abnormal skin rashes Lymphatics: Denies new lymphadenopathy or easy bruising  Neurological:Denies numbness, tingling or new weaknesses Behavioral/Psych: Mood is stable, no new changes  All other systems were reviewed with the patient and are negative.  PHYSICAL EXAMINATION:  ECOG PERFORMANCE STATUS: 0 - Asymptomatic  Vitals:   10/23/16 1105  BP: 123/72  Pulse: 60  Resp: 18  Temp: 98.1 F (36.7 C)   Filed Weights   10/23/16 1105  Weight: 170 lb 14.4 oz (77.5 kg)    GENERAL:alert, no distress and comfortable SKIN: skin color, texture, turgor are normal, no rashes or significant lesions EYES: normal, conjunctiva are pink and non-injected, sclera clear OROPHARYNX:no exudate, no erythema and lips, buccal mucosa, and tongue normal  NECK: supple, thyroid normal size, non-tender, without nodularity LYMPH:  no palpable lymphadenopathy in the cervical, axillary or inguinal LUNGS: clear to auscultation and percussion with normal breathing effort HEART: regular rate & rhythm and no murmurs and no lower extremity edema ABDOMEN:abdomen soft, non-tender and normal bowel sounds Musculoskeletal:no cyanosis of digits and no clubbing  PSYCH: alert & oriented x 3 with fluent speech NEURO: no focal motor/sensory deficits  LABORATORY DATA:  I have reviewed the data as listed CBC Latest Ref Rng & Units 10/10/2016 06/25/2016 06/24/2016  WBC 4.0 - 10.5 K/uL 5.6 7.5 6.7  Hemoglobin 12.0 - 15.0 g/dL 12.0 10.0(L) 9.6(L)  Hematocrit 36.0 - 46.0 % 37.4 28.6(L) 27.4(L)  Platelets 150 - 400 K/uL 198 148(L) 144(L)   PATHOLOGY Diagnosis 10/15/2016 1. Breast, lumpectomy, Right w/ bracketed seed - INVASIVE DUCTAL CARCINOMA, 0.3 CM, MSBR GRADE 1. - MARGINS NOT INVOLVED. - TWO FIBROADENOMAS WITH CALCIFICATIONS. - PREVIOUS  BIOPSY SITES AND CLIPS. 2. Breast, lumpectomy, Left w/seed - INVASIVE DUCTAL CARCINOMA, 1 CM, MSBR GRADE 1. - DUCTAL CARCINOMA IN SITU. - MARGINS NOT INVOLVED. - CLOSEST MARGIN INFERIOR AT 0.15 CM. - PREVIOUS BIOPSY SITE AND CLIPS. 3. Lymph node, sentinel, biopsy, Left axillary #1 - ONE BENIGN LYMPH NODE (0/1). 4. Lymph node, sentinel, biopsy, Left axillary #2 - ONE BENIGN LYMPH NODE (0/1). 5. Lymph node, sentinel, biopsy, Left axillary #3 - BENIGN ADIPOSE TISSUE. - NO LYMPH NODE TISSUE OR MALIGNANCY. 6. Lymph node, sentinel, biopsy, Right axillary #1 - ONE BENIGN LYMPH NODE (0/1). 7. Lymph node, sentinel, biopsy, Right axillary 32 - ONE BENIGN LYMPH NODE (0/1). 8. Lymph node, sentinel, biopsy, Right axillary - ONE BENIGN LYMPH NODE (0/1) 9. Lymph node, sentinel, biopsy, Right axillary - ONE BENIGN LYMPH NODE (0/1). 10. Lymph node, sentinel, biopsy, Right axillary - ONE BENIGN LYMPH NODE (0/1). 1 of 6 FINAL for Julie Sosa, Julie Sosa ([id number]) Microscopic Comment 1. BREAST, INVASIVE TUMOR Procedure: Localized lumpectomy with five sentinel lymph nodes. Laterality: Right breast. Tumor Size: 0.3 cm. Histologic Type: Ductal Grade: I Tubular Differentiation: 1 Nuclear Pleomorphism: 1 Mitotic Count: 1 Ductal Carcinoma in Situ (DCIS): Present, low grade. Extent of Tumor: Skin: N/A Nipple: N/A Skeletal muscle: N/A Margins: Free of tumor. Invasive carcinoma, distance from closest margin: 1 cm from superior margin. DCIS, distance from closest margin: N/A Regional Lymph Nodes: Number of Lymph Nodes Examined: 5 Number of Sentinel Lymph Nodes Examined: 5 Lymph Nodes with Macrometastases: 0 Lymph Nodes with Micrometastases: 0 Lymph Nodes with Isolated Tumor Cells: Breast Prognostic Profile: Case number [id number] Estrogen Receptor: 100%, positive, strong staining. Progesterone Receptor: 90%, positive, strong staining. Her2: Negative, ratio 1.31. Ki-67: 2% Best tumor block for  sendout testing: 1I Pathologic Stage Classification (pTNM, AJCC 8th Edition): Primary Tumor (pT): pT1a Regional Lymph Nodes (pN): pN0 Distant Metastases (pM): pMX Comments: There is a 0.3 cm  residual invasive ductal carcinoma located at the biopsy site with the ribbon shaped biopsy clip. There are fibroadenomas with calcifications located at the coil shaped biopsy clip and the wing shaped biopsy clip. 2. BREAST, INVASIVE TUMOR Procedure: Localized lumpectomy and two sentinel lymph nodes. Laterality: Left breast. Tumor Size: 1 cm Histologic Type: Ductal. Grade: I Tubular Differentiation: 1 Nuclear Pleomorphism: 1 Mitotic Count: 1 Ductal Carcinoma in Situ (DCIS): Present, low grade. Extent of Tumor: Skin: N/A Nipple: N/A Skeletal muscle: N/A 2 of 6 FINAL for Julie Sosa, Julie Sosa ([id number]) Microscopic Comment(continued) Margins: Free of tumor. Invasive carcinoma, distance from closest margin: 0.15 cm from inferior margin. DCIS, distance from closest margin: 0.3 cm from inferior margin. Regional Lymph Nodes: Number of Lymph Nodes Examined: 2 Number of Sentinel Lymph Nodes Examined: 2 Lymph Nodes with Macrometastases: 0 Lymph Nodes with Micrometastases: 0 Lymph Nodes with Isolated Tumor Cells: 0 Breast Prognostic Profile: [id number] Estrogen Receptor: 100%, positive, strong staining. Progesterone Receptor: 100%, positive, strong staining. Her2: Negative, ratio 1.26 Ki-67: 10% Best tumor block for sendout testing: 2A Pathologic Stage Classification (pTNM, AJCC 8th Edition): Primary Tumor (pT): pT1b Regional Lymph Nodes (pN): pN0  Diagnosis 10/07/2016 Breast, right, needle core biopsy, 12:00 o'clock - FIBROADENOMA. - THERE IS NO EVIDENCE OF MALIGNANCY. - SEE COMMENT.  Diagnosis 09/08/2016 1. Breast, right, needle core biopsy, 12:30 4 cm fn INVASIVE DUCTAL CARCINOMA, GRADE 1 DUCTAL CARCINOMA IN SITU IS PRESENT 2. Breast, right, needle core biopsy, 1:30 3 cm  fn FIBROADENOMA 3. Breast, left, needle core biopsy, 3:30 6 cm fn INVASIVE DUCTAL CARCINOMA, GRADE 1 Microscopic Comment 1. Immunostains for p63, calponin, SMM-1 and ck5/6 are negative, positive for ER supporting the diagnosis of invasive ductal carcinoma.  RADIOGRAPHIC STUDIES: I have personally reviewed the radiological images as listed and agreed with the findings in the report. Mm Breast Surgical Specimen  Result Date: 10/15/2016 CLINICAL DATA:  Left lumpectomy for recently diagnosed breast cancer in the 3:30 o'clock position of the left breast. EXAM: SPECIMEN RADIOGRAPH OF THE LEFT BREAST COMPARISON:  Previous exam(s). FINDINGS: Status post excision of the left breast. The radioactive seed and ribbon shaped biopsy marker clip are present, completely intact, and were marked for pathology. IMPRESSION: Specimen radiograph of the left breast. Electronically Signed   By: Claudie Revering M.D.   On: 10/15/2016 13:10   Mm Breast Surgical Specimen  Result Date: 10/15/2016 CLINICAL DATA:  Right lumpectomy for recently diagnosed breast cancer in the 12:30 o'clock position of the right breast as well as surgical excision of a recently diagnosed fibroadenoma in the 1:30 o'clock position of the right breast. EXAM: SPECIMEN RADIOGRAPH OF THE RIGHT BREAST COMPARISON:  Previous exam(s). FINDINGS: Status post excision of the right breast. The ribbon shaped biopsy marker clip and adjacent radioactive seed, wing shaped biopsy marker clip and adjacent radioactive seed and coil shaped biopsy marker clip not localized prior to surgery are present, completely intact, and were marked for pathology. IMPRESSION: Specimen radiograph of the right breast. Electronically Signed   By: Claudie Revering M.D.   On: 10/15/2016 12:47   Mm Lt Radioactive Seed Loc Mammo Guide  Result Date: 10/14/2016 CLINICAL DATA:  Patient presents for seed localization prior to lumpectomy for left breast cancer. (Right breast dictated separately.)  EXAM: MAMMOGRAPHIC GUIDED RADIOACTIVE SEED LOCALIZATION OF THE LEFT BREAST COMPARISON:  Previous exam(s). FINDINGS: Patient presents for radioactive seed localization prior to lumpectomy. I met with the patient and we discussed the procedure of seed localization including benefits and alternatives. We  discussed the high likelihood of a successful procedure. We discussed the risks of the procedure including infection, bleeding, tissue injury and further surgery. We discussed the low dose of radioactivity involved in the procedure. Informed, written consent was given. The usual time-out protocol was performed immediately prior to the procedure. Using mammographic guidance, sterile technique, 1% lidocaine and an I-125 radioactive seed, the ribbon shaped clip in the lower outer quadrant of the left breast was localized using a lateral approach. The follow-up mammogram images confirm the seed in the expected location and were marked for Dr. Barry Dienes. Follow-up survey of the patient confirms presence of the radioactive seed. Order number of I-125 seed:  [id number]. Total activity:  9.357 millicuries  Reference Date: 10/07/2016 The patient tolerated the procedure well and was released from the [place]. She was given instructions regarding seed removal. IMPRESSION: Radioactive seed localization of the left breast. No apparent complications. Electronically Signed   By: Nolon Nations M.D.   On: 10/14/2016 15:23   Mm Rt Radioactive Seed Loc Mammo Guide  Result Date: 10/14/2016 CLINICAL DATA:  Patient presents for radioactive seed localization of malignancy in the 12:30 o'clock location of the right breast, marked with a ribbon shaped clip at the time of biopsy. Request is also made for localization of fibroadenoma in the 130 o'clock location of the right breast, marked with a wing shaped clip at the time biopsy. Patient also has had recent biopsy of a fibroadenoma in the 12 o'clock location of the right breast 2 cm  from nipple marked with a coil shaped clip which does not require excision. Localization is discussed with Dr. Barry Dienes prior to the procedure. (Left breast is dictated separately) EXAM: MAMMOGRAPHIC GUIDED RADIOACTIVE SEED LOCALIZATION OF THE RIGHT BREAST x2 COMPARISON:  Previous exam(s). FINDINGS: Patient presents for radioactive seed localization prior to lumpectomy. I met with the patient and we discussed the procedure of seed localization including benefits and alternatives. We discussed the high likelihood of a successful procedure. We discussed the risks of the procedure including infection, bleeding, tissue injury and further surgery. We discussed the low dose of radioactivity involved in the procedure. Informed, written consent was given. The usual time-out protocol was performed immediately prior to the procedure. Right breast 12:30 o'clock location (invasive ductal carcinoma- ribbon shaped clip): Using mammographic guidance, sterile technique, 1% lidocaine and an I-125 radioactive seed, the ribbon shaped clip was localized using a medial approach. The follow-up mammogram images confirm the seed in the expected location and were marked for Dr. Barry Dienes. Follow-up survey of the patient confirms presence of the radioactive seed. Order number of I-125 seed:  [id number]. Total activity:  0.092 millicuries  Reference Date: 09/26/2016 Right breast 130 o'clock location (fibroadenoma-wing shaped clip): Using mammographic guidance, sterile technique, 1% lidocaine and an I-125 radioactive seed, the wing shaped clip was localized using a medial approach. The follow-up mammogram images confirm the seed in the expected location and were marked for Dr. Barry Dienes. Follow-up survey of the patient confirms presence of the radioactive seed. Order number of I-125 seed:  [id number]. Total activity:  3.335 millicuries  Reference Date: 10/07/2016 The patient tolerated the procedure well and was released from the [place]. She was  given instructions regarding seed removal. IMPRESSION: Radioactive seed localization of 2 sites in the right breast. No apparent complications. Electronically Signed   By: Nolon Nations M.D.   On: 10/14/2016 15:30   Mm Rt Radio Seed Ea Add Lesion Loc Mammo  Result Date: 10/14/2016 CLINICAL DATA:  Patient presents for radioactive seed localization of malignancy in the 12:30 o'clock location of the right breast, marked with a ribbon shaped clip at the time of biopsy. Request is also made for localization of fibroadenoma in the 130 o'clock location of the right breast, marked with a wing shaped clip at the time biopsy. Patient also has had recent biopsy of a fibroadenoma in the 12 o'clock location of the right breast 2 cm from nipple marked with a coil shaped clip which does not require excision. Localization is discussed with Dr. Barry Dienes prior to the procedure. (Left breast is dictated separately) EXAM: MAMMOGRAPHIC GUIDED RADIOACTIVE SEED LOCALIZATION OF THE RIGHT BREAST x2 COMPARISON:  Previous exam(s). FINDINGS: Patient presents for radioactive seed localization prior to lumpectomy. I met with the patient and we discussed the procedure of seed localization including benefits and alternatives. We discussed the high likelihood of a successful procedure. We discussed the risks of the procedure including infection, bleeding, tissue injury and further surgery. We discussed the low dose of radioactivity involved in the procedure. Informed, written consent was given. The usual time-out protocol was performed immediately prior to the procedure. Right breast 12:30 o'clock location (invasive ductal carcinoma- ribbon shaped clip): Using mammographic guidance, sterile technique, 1% lidocaine and an I-125 radioactive seed, the ribbon shaped clip was localized using a medial approach. The follow-up mammogram images confirm the seed in the expected location and were marked for Dr. Barry Dienes. Follow-up survey of the patient  confirms presence of the radioactive seed. Order number of I-125 seed:  [id number]. Total activity:  4.239 millicuries  Reference Date: 09/26/2016 Right breast 130 o'clock location (fibroadenoma-wing shaped clip): Using mammographic guidance, sterile technique, 1% lidocaine and an I-125 radioactive seed, the wing shaped clip was localized using a medial approach. The follow-up mammogram images confirm the seed in the expected location and were marked for Dr. Barry Dienes. Follow-up survey of the patient confirms presence of the radioactive seed. Order number of I-125 seed:  [id number]. Total activity:  5.686 millicuries  Reference Date: 10/07/2016 The patient tolerated the procedure well and was released from the [place]. She was given instructions regarding seed removal. IMPRESSION: Radioactive seed localization of 2 sites in the right breast. No apparent complications. Electronically Signed   By: Nolon Nations M.D.   On: 10/14/2016 15:30   Mm Clip Placement Right  Result Date: 10/07/2016 CLINICAL DATA:  Patient status post ultrasound-guided biopsy right breast mass. EXAM: DIAGNOSTIC RIGHT MAMMOGRAM POST ULTRASOUND BIOPSY COMPARISON:  Previous exam(s). FINDINGS: Mammographic images were obtained following ultrasound guided biopsy of right breast mass. Coil shaped marking clip in appropriate position. IMPRESSION: Appropriate position coil shaped marking clip status post ultrasound-guided biopsy right breast mass. Final Assessment: Post Procedure Mammograms for Marker Placement Electronically Signed   By: Lovey Newcomer M.D.   On: 10/07/2016 15:56   [place] Rt Breast Bx W Loc Dev 1st Lesion Img Bx Spec US Guide  Addendum Date: 10/09/2016   ADDENDUM REPORT: 10/09/2016 13:29 ADDENDUM: Pathology revealed FIBROADENOMA of the Right breast, 12:00 o'clock. This was found to be concordant by Dr. Lovey Newcomer. Pathology results were discussed with the patient by telephone. The patient reported doing well after the biopsy with  tenderness at the site. Post biopsy instructions and care were reviewed and questions were answered. The patient was encouraged to call [facility] for any additional concerns. The patient has a recent diagnosis of bilateral breast cancer and should follow her outlined treatment plan. Pathology results reported  by Terie Purser, RN on 10/09/2016. Electronically Signed   By: Lovey Newcomer M.D.   On: 10/09/2016 13:29   Result Date: 10/09/2016 CLINICAL DATA:  Patient with indeterminate right breast mass 12 o'clock position. EXAM: ULTRASOUND GUIDED RIGHT BREAST CORE NEEDLE BIOPSY COMPARISON:  Previous exam(s). FINDINGS: I met with the patient and we discussed the procedure of ultrasound-guided biopsy, including benefits and alternatives. We discussed the high likelihood of a successful procedure. We discussed the risks of the procedure, including infection, bleeding, tissue injury, clip migration, and inadequate sampling. Informed written consent was given. The usual time-out protocol was performed immediately prior to the procedure. Lesion quadrant: Upper inner quadrant Using sterile technique and 1% Lidocaine as local anesthetic, under direct ultrasound visualization, a 12 gauge spring-loaded device was used to perform biopsy of right breast mass 12 o'clock position using a lateral approach. At the conclusion of the procedure a coil shaped tissue marker clip was deployed into the biopsy cavity. Follow up 2 view mammogram was performed and dictated separately. IMPRESSION: Ultrasound guided biopsy of right breast mass 12 o'clock position. No apparent complications. Electronically Signed: By: Lovey Newcomer M.D. On: 10/07/2016 15:55    ASSESSMENT & PLAN: 81 y.o. postmenopausal woman, presented with screening discovered bilateral breast cancer.   1. Bilateral Breast Cancer, lower-outer quadrant of left breast, pT1bN0M0, stage IA, and upper inner quadrant of right breast,  pT1aN0M0, stage IA,  both invasive ductal carcinoma, grade 1, ER and PR strongly positive, HER-2 negative -I have reviewed her initial image findings, biopsy results, and her final surgical pathology results, discussed with patient in details. -She had bilateral invasive ductal carcinoma, post very early stage, with tumor <1.0cm, low-grade, I think her risk of recurrence is small given the strongly ER/PR positive and HER-2 negative disease, I do not think we need Oncotype. No adjuvant chemotherapy is needed. - Given the strong ER and PR positivity and her post menopause status, I do recommend adjuvant aromatase inhibitor to reduce her risk of cancer recurrence,  The potential benefit and side effects, which includes but not limited to, hot flash, skin and vaginal dryness, metabolic changes ( increased blood glucose, cholesterol, weight, etc.), slightly in increased risk of cardiovascular disease, cataracts, muscular and joint discomfort, osteopenia and osteoporosis, etc, were discussed with her in great details. She is interested, and we'll start after she completes radiation. -She does have some arthritis, if she has significant arthralgia from aromatase inhibitor, I'll switch her to tamoxifen. - She may benefit from radiation and has an appointment with Dr. Isidore Moos next week. If she does not undergo radiation, I will start her anti-estrogen therapy earlier  - due to living in [place], I suggest she receive radiation at [facility]. She would like to continue coming to [facility] for treatment -We reviewed breast cancer surveillance. I strongly recommend her to continue annual screening mammogram, perform self exam, and a follow-up with [place] routinely -We discussed the other risk factors for breast cancer. I recommend her to avoid any estrogen or progesterone containing supplements, healthy diet, and exercise regularly. She 81 year old, but still fairly physically active.  2. Genetics  - Due to family history of breast  cancer and her personal history of bilateral breast cancer, I recommend genetic counseling. She is willing to consider, but wants to wait right now before scheduling an appointment. -will revisit this in the future   3. Bone health  -Her last bone density scan was many years ago.  I will order bone density  scan -We discussed the potential side effects of osteopenia and also process from aromatase inhibitor, we'll monitor her bone density closely when she is on aromatase inhibitor.  4. Arthritis  -Follow up with PCP  5. Depression - takes Zoloft  PLAN - order bone density scan - radiation appointment with Dr. Isidore Moos next week. -I plan to see her back after she completes adjuvant radiation, or sooner if radiation is not recommended.  Orders Placed This Encounter  Procedures  . DG Bone Density    Standing Status:   Future    Standing Expiration Date:   10/23/2017    Order Specific Question:   Reason for Exam (SYMPTOM  OR DIAGNOSIS REQUIRED)    Answer:   screening    Order Specific Question:   Preferred imaging location?    Answer:   [facility]    All questions were answered. The patient knows to call the clinic with any problems, questions or concerns. I spent 55 minutes counseling the patient face to face. The total time spent in the appointment was 60 minutes and more than 50% was on counseling.   This document serves as a record of services personally performed by Truitt Merle, MD. It was created on her behalf by Brandt Loosen, a trained medical scribe. The creation of this record is based on the scribe's personal observations and the provider's statements to them. This document has been checked and approved by the attending provider.

## 2016-10-23 ENCOUNTER — Ambulatory Visit (HOSPITAL_BASED_OUTPATIENT_CLINIC_OR_DEPARTMENT_OTHER): Payer: PPO | Admitting: Hematology

## 2016-10-23 ENCOUNTER — Encounter: Payer: Self-pay | Admitting: *Deleted

## 2016-10-23 ENCOUNTER — Encounter: Payer: Self-pay | Admitting: Hematology

## 2016-10-23 VITALS — BP 123/72 | HR 60 | Temp 98.1°F | Resp 18 | Ht 63.75 in | Wt 170.9 lb

## 2016-10-23 DIAGNOSIS — C50811 Malignant neoplasm of overlapping sites of right female breast: Secondary | ICD-10-CM

## 2016-10-23 DIAGNOSIS — Z803 Family history of malignant neoplasm of breast: Secondary | ICD-10-CM | POA: Diagnosis not present

## 2016-10-23 DIAGNOSIS — M1990 Unspecified osteoarthritis, unspecified site: Secondary | ICD-10-CM

## 2016-10-23 DIAGNOSIS — Z170 Estrogen receptor positive status [ER+]: Secondary | ICD-10-CM | POA: Diagnosis not present

## 2016-10-23 DIAGNOSIS — E2839 Other primary ovarian failure: Secondary | ICD-10-CM

## 2016-10-23 DIAGNOSIS — Z801 Family history of malignant neoplasm of trachea, bronchus and lung: Secondary | ICD-10-CM | POA: Diagnosis not present

## 2016-10-23 DIAGNOSIS — C50512 Malignant neoplasm of lower-outer quadrant of left female breast: Secondary | ICD-10-CM

## 2016-10-23 DIAGNOSIS — F329 Major depressive disorder, single episode, unspecified: Secondary | ICD-10-CM | POA: Diagnosis not present

## 2016-10-23 DIAGNOSIS — C50211 Malignant neoplasm of upper-inner quadrant of right female breast: Secondary | ICD-10-CM

## 2016-10-23 DIAGNOSIS — C50912 Malignant neoplasm of unspecified site of left female breast: Secondary | ICD-10-CM

## 2016-10-23 DIAGNOSIS — C50812 Malignant neoplasm of overlapping sites of left female breast: Secondary | ICD-10-CM

## 2016-10-23 DIAGNOSIS — C50911 Malignant neoplasm of unspecified site of right female breast: Secondary | ICD-10-CM | POA: Insufficient documentation

## 2016-10-24 NOTE — Progress Notes (Signed)
pls let patient know margins and LN negative if not done already.

## 2016-10-26 ENCOUNTER — Encounter: Payer: Self-pay | Admitting: Hematology

## 2016-10-28 ENCOUNTER — Ambulatory Visit
Admission: RE | Admit: 2016-10-28 | Discharge: 2016-10-28 | Disposition: A | Discharge status: Home / Self Care | Payer: PPO | Source: Ambulatory Visit | Attending: Radiation Oncology | Admitting: Radiation Oncology

## 2016-10-28 ENCOUNTER — Encounter: Payer: Self-pay | Admitting: Radiation Oncology

## 2016-10-28 VITALS — BP 120/80 | HR 61 | Temp 98.4°F | Ht 63.75 in | Wt 170.4 lb

## 2016-10-28 DIAGNOSIS — I10 Essential (primary) hypertension: Secondary | ICD-10-CM | POA: Insufficient documentation

## 2016-10-28 DIAGNOSIS — Z809 Family history of malignant neoplasm, unspecified: Secondary | ICD-10-CM | POA: Insufficient documentation

## 2016-10-28 DIAGNOSIS — E7800 Pure hypercholesterolemia, unspecified: Secondary | ICD-10-CM | POA: Diagnosis not present

## 2016-10-28 DIAGNOSIS — Z510 Encounter for antineoplastic radiation therapy: Secondary | ICD-10-CM | POA: Diagnosis not present

## 2016-10-28 DIAGNOSIS — C50211 Malignant neoplasm of upper-inner quadrant of right female breast: Secondary | ICD-10-CM

## 2016-10-28 DIAGNOSIS — R6889 Other general symptoms and signs: Secondary | ICD-10-CM | POA: Diagnosis not present

## 2016-10-28 DIAGNOSIS — F329 Major depressive disorder, single episode, unspecified: Secondary | ICD-10-CM | POA: Insufficient documentation

## 2016-10-28 DIAGNOSIS — Z79899 Other long term (current) drug therapy: Secondary | ICD-10-CM | POA: Diagnosis not present

## 2016-10-28 DIAGNOSIS — G2581 Restless legs syndrome: Secondary | ICD-10-CM | POA: Insufficient documentation

## 2016-10-28 DIAGNOSIS — Z96643 Presence of artificial hip joint, bilateral: Secondary | ICD-10-CM | POA: Insufficient documentation

## 2016-10-28 DIAGNOSIS — C50512 Malignant neoplasm of lower-outer quadrant of left female breast: Secondary | ICD-10-CM | POA: Diagnosis not present

## 2016-10-28 DIAGNOSIS — Z9889 Other specified postprocedural states: Secondary | ICD-10-CM | POA: Diagnosis not present

## 2016-10-28 DIAGNOSIS — F419 Anxiety disorder, unspecified: Secondary | ICD-10-CM | POA: Diagnosis not present

## 2016-10-28 DIAGNOSIS — Z170 Estrogen receptor positive status [ER+]: Secondary | ICD-10-CM | POA: Insufficient documentation

## 2016-10-28 DIAGNOSIS — R21 Rash and other nonspecific skin eruption: Secondary | ICD-10-CM | POA: Diagnosis not present

## 2016-10-28 DIAGNOSIS — R51 Headache: Secondary | ICD-10-CM | POA: Diagnosis not present

## 2016-10-28 DIAGNOSIS — Z9049 Acquired absence of other specified parts of digestive tract: Secondary | ICD-10-CM | POA: Insufficient documentation

## 2016-10-28 DIAGNOSIS — K219 Gastro-esophageal reflux disease without esophagitis: Secondary | ICD-10-CM | POA: Diagnosis not present

## 2016-10-28 LAB — TSH: TSH: 3.008 m(IU)/L (ref 0.308–3.960)

## 2016-10-28 NOTE — Progress Notes (Signed)
Radiation Oncology         [phone] ________________________________  Initial outpatient Consultation  Name: Julie Sosa MRN: [id number]  Date: 10/28/2016  DOB: 1935/12/08  [place]:OMBTD, Carloyn Manner, MD  [name], MD   REFERRING PHYSICIAN: [name], MD  DIAGNOSIS:    ICD-10-CM   1. Cold intolerance R68.89 TSH  2. Malignant neoplasm of upper-inner quadrant of right breast in female, estrogen receptor positive ([place]) C50.211    Z17.0   3. Malignant neoplasm of lower-outer quadrant of left breast of female, estrogen receptor positive ([place]) C50.512    Z17.0    Left breast CA, Stage IA (pT1b, pN0, cM0, G1), Invasive ductal carcinoma, ER positive, PR positive, HER2 negative  Right breast CA, Stage IA (pT1a, pN0, cM0, G1), Invasive ductal carcinoma, ER positive, PR positive, HER2 negative  CHIEF COMPLAINT: Here to discuss management of bilateral breast cancer  HISTORY OF PRESENT ILLNESS::Julie Sosa is a 81 y.o. female who presented with abnormalities found on screening mammogram on 08/14/2016 consistent with possible distortion of bilateral breasts.  Recommended diagnostic mammogram study on 08/26/2016 that showed right breast mass at 12:30 and 1:30 position and left breast mass at 3:30 position.  Recommended [place] left breast study completed on 08/26/2016 that corroborated the previous mammogram, but showed additional indeterminate elongated mass in the right breast at the 12 o'clock position.  Pt had a biopsy completed on 09/09/2016 that showed: Breast, right, needle core biopsy, 12:30 4 cm fn INVASIVE DUCTAL CARCINOMA, GRADE 1 DUCTAL CARCINOMA IN SITU IS PRESENT. Breast, right, needle core biopsy, 1:30 3 cm fn FIBROADENOMA. Breast, left, needle core biopsy, 3:30 6 cm fn INVASIVE DUCTAL CARCINOMA, GRADE 1.   Pt had a bilateral breast lumpectomy completed on 10/15/2016 with Dr. Barry Dienes that showed 7 lymph nodes resulting negative for malignancy and the following resulting diagnoses  (right tumor was 58m, left tumor was 1cm):  1. Breast, lumpectomy, Right w/ bracketed seed - INVASIVE DUCTAL CARCINOMA, 0.3 CM, MSBR GRADE 1. - MARGINS NOT INVOLVED. - TWO FIBROADENOMAS WITH CALCIFICATIONS. - PREVIOUS BIOPSY SITES AND CLIPS. 2. Breast, lumpectomy, Left w/seed - INVASIVE DUCTAL CARCINOMA, 1 CM, MSBR GRADE 1. - DUCTAL CARCINOMA IN SITU. - MARGINS NOT INVOLVED. - CLOSEST MARGIN INFERIOR AT 0.15 CM. - PREVIOUS BIOPSY SITE AND CLIPS.  Pt was evaluated by Dr. FBurr Medicoon 10/23/2016 where it was discussed that a bone density scan would be ordered, a follow up appointment with Dr. SIsidore Moosto discuss radiation therapy. Dr. FBurr Medicoalso recommended seeing the pt after adjuvant radiation therapy treatment was completed.    The patient presents to the clinic today to discuss the role that radiation therapy may play in the treatment of her disease. The patient is accompanied by her husband today. Pt voices concern for skin irritation during and following radiation therapy, due to a relative having similar skin issues during radiation therapy. Pt states that she has not begin to take anti-estrogen pills with Dr. FBurr Medicoas of yet, but she is in the process of being scheduled for a bone density scan.Pt voices concern of the side effects of the anti-estrogen pill that Dr. FBurr Medicorecommended for her due to a relative having issues with blood clots. She states that she used to take premarin that was prescribed to her by Dr. FWilley Blade but she is unaware of the last time that she had her TSH levels checked. She notes that there was a discussion with Dr[facility] FBur[facility]of[facility]  which the pt opted not to do so and preferred treatment at [facility].   On review of systems, pt denies BUE swelling, but notes that she has had swelling to the incision site. Pt reports chills and mild weight loss since her right hip replacement surgery. Cold intolerance noted. Skin rash under  breasts.     PREVIOUS RADIATION THERAPY: No  PAST MEDICAL HISTORY:  has a past medical history of Anemia; Anxiety; Arthritis; Cancer ([place]); Depression; GERD (gastroesophageal reflux disease); Headache; Hypercholesteremia; Hypertension; and Restless leg syndrome.    PAST SURGICAL HISTORY: Past Surgical History:  Procedure Laterality Date  . ABDOMINAL HYSTERECTOMY    . BREAST LUMPECTOMY WITH RADIOACTIVE SEED AND SENTINEL LYMPH NODE BIOPSY Bilateral 10/15/2016   Procedure: RIGHT BREAST LUMPECTOMY WITH BRACKETED RADIOACTIVE SEEDS AND RIGHT SENTINEL LYMPH NODE BIOPSY, LEFT BREAST LUMPECTOMY WITH RADIOACTIVE SEED AND LEFT SENTINEL LYMPH NODE BIOPSY;  Surgeon: [name], MD;  Location: [place];  Service: General;  Laterality: Bilateral;  . BREAST SURGERY     left breast cyst removed  . CATARACT EXTRACTION W/PHACO  03/11/2012   Procedure: CATARACT EXTRACTION PHACO AND INTRAOCULAR LENS PLACEMENT (IOC);  Surgeon: Tonny Branch, MD;  Location: [facility];  Service: Ophthalmology;  Laterality: Right;  CDE:17.71  . CATARACT EXTRACTION W/PHACO  03/29/2012   Procedure: CATARACT EXTRACTION PHACO AND INTRAOCULAR LENS PLACEMENT (IOC);  Surgeon: Tonny Branch, MD;  Location: [facility];  Service: Ophthalmology;  Laterality: Left;  CDE:17.49  . CHOLECYSTECTOMY  10 yrs ago   Arnoldo Morale  . EYE SURGERY Bilateral    cataract extraction  . MUSCLE BIOPSY     removal of cyst-right calf  . TOTAL HIP ARTHROPLASTY Left 12/25/2014   Procedure: TOTAL HIP ARTHROPLASTY;  Surgeon: Dereck Leep, MD;  Location: [facility];  Service: Orthopedics;  Laterality: Left;  . TOTAL HIP ARTHROPLASTY Right 06/23/2016   Procedure: TOTAL HIP ARTHROPLASTY;  Surgeon: Dereck Leep, MD;  Location: [facility];  Service: Orthopedics;  Laterality: Right;  . YAG LASER APPLICATION Right 9/74/1638   Procedure: YAG LASER APPLICATION;  Surgeon: Williams Che, MD;  Location: [facility];  Service: Ophthalmology;  Laterality: Right;  . YAG LASER APPLICATION Left  4/53/6468   Procedure: YAG LASER APPLICATION;  Surgeon: Williams Che, MD;  Location: [facility];  Service: Ophthalmology;  Laterality: Left;    FAMILY HISTORY: family history includes Cancer in her brother; Cancer (age of onset: 43) in her other; Cancer (age of onset: 42) in her sister.  SOCIAL HISTORY:  reports that she has never smoked. She has never used smokeless tobacco. She reports that she does not drink alcohol or use drugs.  ALLERGIES: Cefuroxime  MEDICATIONS:  Current Outpatient Prescriptions  Medication Sig Dispense Refill  . atenolol (TENORMIN) 50 MG tablet Take 50 mg by mouth daily. In am.    . hydrochlorothiazide (HYDRODIURIL) 25 MG tablet Take 25 mg by mouth daily. In am.    . potassium chloride SA (K-DUR,KLOR-CON) 20 MEQ tablet Take 20-40 mEq by mouth 2 (two) times daily. 40 MEQ in the morning & 20 MEQ at night after supper    . rOPINIRole (REQUIP) 1 MG tablet Take 1 mg by mouth daily at 8 pm.     . sertraline (ZOLOFT) 100 MG tablet Take 100 mg by mouth daily. In am.    . simvastatin (ZOCOR) 20 MG tablet Take 20 mg by mouth daily. In am.    . traZODone (DESYREL) 100 MG tablet Take 100 mg by mouth  at bedtime.   2  . oxyCODONE (OXY IR/ROXICODONE) 5 MG immediate release tablet Take 1-2 tablets (5-10 mg total) by mouth every 4 (four) hours as needed for severe pain. (Patient not taking: Reported on 10/28/2016) 20 tablet 0  . traMADol (ULTRAM) 50 MG tablet Take 1-2 tablets (50-100 mg total) by mouth every 4 (four) hours as needed for moderate pain. (Patient not taking: Reported on 10/06/2016) 60 tablet 0   No current facility-administered medications for this encounter.     REVIEW OF SYSTEMS:  As above   PHYSICAL EXAM:  height is 5' 3.75" (1.619 m) and weight is 170 lb 6.4 oz (77.3 kg). Her temperature is 98.4 F (36.9 C). Her blood pressure is 120/80 and her pulse is 61. Her oxygen saturation is 98%.   General: Alert and oriented, in no acute distress HEENT: Head is  normocephalic. Oropharynx is clear. Neck: Neck is supple, no palpable cervical or supraclavicular lymphadenopathy or masses.  Heart: Regular in rate and rhythm with no murmurs, rubs, or gallops. Chest: Clear to auscultation bilaterally, with no rhonchi, wheezes, or rales. Extremities: No upper or lower extremity edema. Lymphatics: see Neck Exam Skin:   Appears to have yeast-like rash in the mammary folds bilaterally.  Neurologic: Cranial nerves II through XII are grossly intact. No obvious focalities. Speech is fluent. Coordination is intact. Psychiatric: Judgment and insight are intact. Affect is appropriate. Breast exam reveals upper inner quadrant of right breast with post surgical swelling. Lumpectomy cavity over the upper areola and right axillary scar are healing well. Lower outer quadrant of left breast healing well with post-biopsy swelling that is milder than the right side. Left axillary scar is well healing.    ECOG = 1   LABORATORY DATA:  Lab Results  Component Value Date   WBC 5.6 10/10/2016   HGB 12.0 10/10/2016   HCT 37.4 10/10/2016   MCV 85.4 10/10/2016   PLT 198 10/10/2016   CMP     Component Value Date/Time   NA 135 10/10/2016 1041   K 3.9 10/10/2016 1041   CL 102 10/10/2016 1041   CO2 24 10/10/2016 1041   GLUCOSE 109 (H) 10/10/2016 1041   BUN 8 10/10/2016 1041   CREATININE 0.92 10/10/2016 1041   CALCIUM 9.7 10/10/2016 1041   PROT 7.0 10/10/2016 1041   ALBUMIN 4.3 10/10/2016 1041   AST 22 10/10/2016 1041   ALT 21 10/10/2016 1041   ALKPHOS 70 10/10/2016 1041   BILITOT 0.8 10/10/2016 1041   GFRNONAA 58 (L) 10/10/2016 1041   GFRAA >60 10/10/2016 1041         RADIOGRAPHY: Mm Breast Surgical Specimen  Result Date: 10/15/2016 CLINICAL DATA:  Left lumpectomy for recently diagnosed breast cancer in the 3:30 o'clock position of the left breast. EXAM: SPECIMEN RADIOGRAPH OF THE LEFT BREAST COMPARISON:  Previous exam(s). FINDINGS: Status post excision of the  left breast. The radioactive seed and ribbon shaped biopsy marker clip are present, completely intact, and were marked for pathology. IMPRESSION: Specimen radiograph of the left breast. Electronically Signed   By: Claudie Revering M.D.   On: 10/15/2016 13:10   Mm Breast Surgical Specimen  Result Date: 10/15/2016 CLINICAL DATA:  Right lumpectomy for recently diagnosed breast cancer in the 12:30 o'clock position of the right breast as well as surgical excision of a recently diagnosed fibroadenoma in the 1:30 o'clock position of the right breast. EXAM: SPECIMEN RADIOGRAPH OF THE RIGHT BREAST COMPARISON:  Previous exam(s). FINDINGS: Status post excision of  the right breast. The ribbon shaped biopsy marker clip and adjacent radioactive seed, wing shaped biopsy marker clip and adjacent radioactive seed and coil shaped biopsy marker clip not localized prior to surgery are present, completely intact, and were marked for pathology. IMPRESSION: Specimen radiograph of the right breast. Electronically Signed   By: Claudie Revering M.D.   On: 10/15/2016 12:47   Mm Lt Radioactive Seed Loc Mammo Guide  Result Date: 10/14/2016 CLINICAL DATA:  Patient presents for seed localization prior to lumpectomy for left breast cancer. (Right breast dictated separately.) EXAM: MAMMOGRAPHIC GUIDED RADIOACTIVE SEED LOCALIZATION OF THE LEFT BREAST COMPARISON:  Previous exam(s). FINDINGS: Patient presents for radioactive seed localization prior to lumpectomy. I met with the patient and we discussed the procedure of seed localization including benefits and alternatives. We discussed the high likelihood of a successful procedure. We discussed the risks of the procedure including infection, bleeding, tissue injury and further surgery. We discussed the low dose of radioactivity involved in the procedure. Informed, written consent was given. The usual time-out protocol was performed immediately prior to the procedure. Using mammographic guidance,  sterile technique, 1% lidocaine and an I-125 radioactive seed, the ribbon shaped clip in the lower outer quadrant of the left breast was localized using a lateral approach. The follow-up mammogram images confirm the seed in the expected location and were marked for Dr. Barry Dienes. Follow-up survey of the patient confirms presence of the radioactive seed. Order number of I-125 seed:  [id number]. Total activity:  5.038 millicuries  Reference Date: 10/07/2016 The patient tolerated the procedure well and was released from the [facility]. She was given instructions regarding seed removal. IMPRESSION: Radioactive seed localization of the left breast. No apparent complications. Electronically Signed   By: Nolon Nations M.D.   On: 10/14/2016 15:23   Mm Rt Radioactive Seed Loc Mammo Guide  Result Date: 10/14/2016 CLINICAL DATA:  Patient presents for radioactive seed localization of malignancy in the 12:30 o'clock location of the right breast, marked with a ribbon shaped clip at the time of biopsy. Request is also made for localization of fibroadenoma in the 130 o'clock location of the right breast, marked with a wing shaped clip at the time biopsy. Patient also has had recent biopsy of a fibroadenoma in the 12 o'clock location of the right breast 2 cm from nipple marked with a coil shaped clip which does not require excision. Localization is discussed with Dr. Barry Dienes prior to the procedure. (Left breast is dictated separately) EXAM: MAMMOGRAPHIC GUIDED RADIOACTIVE SEED LOCALIZATION OF THE RIGHT BREAST x2 COMPARISON:  Previous exam(s). FINDINGS: Patient presents for radioactive seed localization prior to lumpectomy. I met with the patient and we discussed the procedure of seed localization including benefits and alternatives. We discussed the high likelihood of a successful procedure. We discussed the risks of the procedure including infection, bleeding, tissue injury and further surgery. We discussed the low dose of  radioactivity involved in the procedure. Informed, written consent was given. The usual time-out protocol was performed immediately prior to the procedure. Right breast 12:30 o'clock location (invasive ductal carcinoma- ribbon shaped clip): Using mammographic guidance, sterile technique, 1% lidocaine and an I-125 radioactive seed, the ribbon shaped clip was localized using a medial approach. The follow-up mammogram images confirm the seed in the expected location and were marked for Dr. Barry Dienes. Follow-up survey of the patient confirms presence of the radioactive seed. Order number of I-125 seed:  [id number]. Total activity:  1.791 millicuries  Reference Date: 09/26/2016 Right breast  130 o'clock location (fibroadenoma-wing shaped clip): Using mammographic guidance, sterile technique, 1% lidocaine and an I-125 radioactive seed, the wing shaped clip was localized using a medial approach. The follow-up mammogram images confirm the seed in the expected location and were marked for Dr. Barry Dienes. Follow-up survey of the patient confirms presence of the radioactive seed. Order number of I-125 seed:  [id number]. Total activity:  2.595 millicuries  Reference Date: 10/07/2016 The patient tolerated the procedure well and was released from the [facility]. She was given instructions regarding seed removal. IMPRESSION: Radioactive seed localization of 2 sites in the right breast. No apparent complications. Electronically Signed   By: Nolon Nations M.D.   On: 10/14/2016 15:30   Mm Rt Radio Seed Ea Add Lesion Loc Mammo  Result Date: 10/14/2016 CLINICAL DATA:  Patient presents for radioactive seed localization of malignancy in the 12:30 o'clock location of the right breast, marked with a ribbon shaped clip at the time of biopsy. Request is also made for localization of fibroadenoma in the 130 o'clock location of the right breast, marked with a wing shaped clip at the time biopsy. Patient also has had recent biopsy of a  fibroadenoma in the 12 o'clock location of the right breast 2 cm from nipple marked with a coil shaped clip which does not require excision. Localization is discussed with Dr. Barry Dienes prior to the procedure. (Left breast is dictated separately) EXAM: MAMMOGRAPHIC GUIDED RADIOACTIVE SEED LOCALIZATION OF THE RIGHT BREAST x2 COMPARISON:  Previous exam(s). FINDINGS: Patient presents for radioactive seed localization prior to lumpectomy. I met with the patient and we discussed the procedure of seed localization including benefits and alternatives. We discussed the high likelihood of a successful procedure. We discussed the risks of the procedure including infection, bleeding, tissue injury and further surgery. We discussed the low dose of radioactivity involved in the procedure. Informed, written consent was given. The usual time-out protocol was performed immediately prior to the procedure. Right breast 12:30 o'clock location (invasive ductal carcinoma- ribbon shaped clip): Using mammographic guidance, sterile technique, 1% lidocaine and an I-125 radioactive seed, the ribbon shaped clip was localized using a medial approach. The follow-up mammogram images confirm the seed in the expected location and were marked for Dr. Barry Dienes. Follow-up survey of the patient confirms presence of the radioactive seed. Order number of I-125 seed:  [id number]. Total activity:  2.951 millicuries  Reference Date: 09/26/2016 Right breast 130 o'clock location (fibroadenoma-wing shaped clip): Using mammographic guidance, sterile technique, 1% lidocaine and an I-125 radioactive seed, the wing shaped clip was localized using a medial approach. The follow-up mammogram images confirm the seed in the expected location and were marked for Dr. Barry Dienes. Follow-up survey of the patient confirms presence of the radioactive seed. Order number of I-125 seed:  [id number]. Total activity:  0.160 millicuries  Reference Date: 10/07/2016 The patient tolerated the  procedure well and was released from the [facility]. She was given instructions regarding seed removal. IMPRESSION: Radioactive seed localization of 2 sites in the right breast. No apparent complications. Electronically Signed   By: Nolon Nations M.D.   On: 10/14/2016 15:30   Mm Clip Placement Right  Result Date: 10/07/2016 CLINICAL DATA:  Patient status post ultrasound-guided biopsy right breast mass. EXAM: DIAGNOSTIC RIGHT MAMMOGRAM POST ULTRASOUND BIOPSY COMPARISON:  Previous exam(s). FINDINGS: Mammographic images were obtained following ultrasound guided biopsy of right breast mass. Coil shaped marking clip in appropriate position. IMPRESSION: Appropriate position coil shaped marking clip status post ultrasound-guided biopsy right breast  mass. Final Assessment: Post Procedure Mammograms for Marker Placement Electronically Signed   By: Lovey Newcomer M.D.   On: 10/07/2016 15:56   [place] Rt Breast Bx W Loc Dev 1st Lesion Img Bx Spec US Guide  Addendum Date: 10/09/2016   ADDENDUM REPORT: 10/09/2016 13:29 ADDENDUM: Pathology revealed FIBROADENOMA of the Right breast, 12:00 o'clock. This was found to be concordant by Dr. Lovey Newcomer. Pathology results were discussed with the patient by telephone. The patient reported doing well after the biopsy with tenderness at the site. Post biopsy instructions and care were reviewed and questions were answered. The patient was encouraged to call [facility] for any additional concerns. The patient has a recent diagnosis of bilateral breast cancer and should follow her outlined treatment plan. Pathology results reported by Terie Purser, RN on 10/09/2016. Electronically Signed   By: Lovey Newcomer M.D.   On: 10/09/2016 13:29   Result Date: 10/09/2016 CLINICAL DATA:  Patient with indeterminate right breast mass 12 o'clock position. EXAM: ULTRASOUND GUIDED RIGHT BREAST CORE NEEDLE BIOPSY COMPARISON:  Previous exam(s). FINDINGS: I met with the patient and  we discussed the procedure of ultrasound-guided biopsy, including benefits and alternatives. We discussed the high likelihood of a successful procedure. We discussed the risks of the procedure, including infection, bleeding, tissue injury, clip migration, and inadequate sampling. Informed written consent was given. The usual time-out protocol was performed immediately prior to the procedure. Lesion quadrant: Upper inner quadrant Using sterile technique and 1% Lidocaine as local anesthetic, under direct ultrasound visualization, a 12 gauge spring-loaded device was used to perform biopsy of right breast mass 12 o'clock position using a lateral approach. At the conclusion of the procedure a coil shaped tissue marker clip was deployed into the biopsy cavity. Follow up 2 view mammogram was performed and dictated separately. IMPRESSION: Ultrasound guided biopsy of right breast mass 12 o'clock position. No apparent complications. Electronically Signed: By: Lovey Newcomer M.D. On: 10/07/2016 15:55      IMPRESSION/PLAN: bilateral breast cancer For the patient's early stage favorable risk breast cancer, we had a thorough discussion about her options for adjuvant therapy. One option would be antiestrogen therapy as discussed with medical oncology. She would take a pill for approximately 5 years. The alternative option (but less standard) would be radiotherapy to the breast. The most aggressive option would be to pursue both modalities.  Of note, I discussed the data from the [employer] [employer] al trial in the [place] [facility]. She understands that tamoxifen compared to radiation plus tamoxifen demonstrated no survival benefit among the women in this study. The women were 20 years or older with stage I estrogen receptor positive breast cancer. Based on this study, I told the patient that her overall life expectancy should not be affected by adding radiotherapy to antiestrogen medication. She understands that the main  benefit of  adding radiotherapy to anti estrogen therapy would be a very small but measurable local control benefit (risk of local recurrence to be lowered from ~9% --> ~2% for [facility] breast over a decade).  We discussed the fact that radiotherapy only provides a local control benefit while anti-estrogen pills provide systemic coverage. That being said, the risk of systemic failure is relatively low with her type of breast cancer.   We discussed the risks benefits and side effects of radiotherapy. She understands that the side effects would likely include some skin irritation and fatigue during the weeks of radiation. There is a risk  of late effects which include but are not necessarily limited to cosmetic changes and rare lung toxicity.  After a thorough discussion, the patient would like to proceed with radiotherapy.  Julie Sosa is a 81 y.o. pleasant female patient, who I advised 3.5 weeks of 16 fractions radiation therapy treatments beginning in mid-late august. Discussed the risks, benefits, and side effects of radiation therapy. Discussed CT simulation and treatment planning on next visit in 2-3 weeks prior to radiation therapy treatment beginning in mid to late August. TSH level will be drawn today due to there being no recent or past TSH level noted within the system to rule out hypothyroidism r/t cold intolerance Will start the pt on an anti-fungal powder for yeast-like rash to bilateral mammary folds for use BID until symptoms resolve.   __________________________________________   Eppie Gibson, MD  This document serves as a record of services personally performed by Eppie Gibson, MD. It was created on her behalf by Maryla Morrow, a trained medical scribe. The creation of this record is based on the scribe's personal observations and the provider's statements to them. This document has been checked and approved by the attending provider.

## 2016-10-29 ENCOUNTER — Telehealth: Payer: Self-pay

## 2016-10-29 NOTE — Telephone Encounter (Signed)
I called Julie Sosa and informed that her thyroid function is normal according to the lab she had drawn yesterday while here for consult. She voiced her understanding and appreciation of the phone call. She knows to call me if she has any further questions or concerns.

## 2016-11-07 ENCOUNTER — Ambulatory Visit
Admission: RE | Admit: 2016-11-07 | Discharge: 2016-11-07 | Disposition: A | Discharge status: Home / Self Care | Payer: PPO | Source: Ambulatory Visit | Attending: Hematology | Admitting: Hematology

## 2016-11-07 DIAGNOSIS — E2839 Other primary ovarian failure: Secondary | ICD-10-CM

## 2016-11-07 DIAGNOSIS — Z780 Asymptomatic menopausal state: Secondary | ICD-10-CM | POA: Diagnosis not present

## 2016-11-11 ENCOUNTER — Ambulatory Visit
Admission: RE | Admit: 2016-11-11 | Discharge: 2016-11-11 | Disposition: A | Discharge status: Home / Self Care | Payer: PPO | Source: Ambulatory Visit | Attending: Radiation Oncology | Admitting: Radiation Oncology

## 2016-11-11 DIAGNOSIS — C50211 Malignant neoplasm of upper-inner quadrant of right female breast: Secondary | ICD-10-CM

## 2016-11-11 DIAGNOSIS — Z170 Estrogen receptor positive status [ER+]: Principal | ICD-10-CM

## 2016-11-11 DIAGNOSIS — C50512 Malignant neoplasm of lower-outer quadrant of left female breast: Secondary | ICD-10-CM

## 2016-11-11 DIAGNOSIS — Z510 Encounter for antineoplastic radiation therapy: Secondary | ICD-10-CM | POA: Diagnosis not present

## 2016-11-12 ENCOUNTER — Telehealth: Payer: Self-pay

## 2016-11-12 DIAGNOSIS — Z510 Encounter for antineoplastic radiation therapy: Secondary | ICD-10-CM | POA: Diagnosis not present

## 2016-11-12 DIAGNOSIS — C50512 Malignant neoplasm of lower-outer quadrant of left female breast: Secondary | ICD-10-CM | POA: Diagnosis not present

## 2016-11-12 DIAGNOSIS — C50211 Malignant neoplasm of upper-inner quadrant of right female breast: Secondary | ICD-10-CM | POA: Diagnosis not present

## 2016-11-12 NOTE — Telephone Encounter (Signed)
I called Julie Sosa today at the request of Dr. Isidore Moos. I informed her that Dr. Isidore Moos had spoken with Dr. Barry Dienes and Dr. Barry Dienes definitely wants Julie Sosa to receive 16 fractions of radiation over 3 1/2 weeks. Dr. Barry Dienes does agree with 2 day breaks on the weekends but not longer breaks than that. She voiced her understanding and appreciation of the phone call, and know to call me back if she has any further questions.

## 2016-11-12 NOTE — Progress Notes (Signed)
  Radiation Oncology         [phone] ________________________________  Name: Julie Sosa MRN: [id number]  Date: 11/11/2016  DOB: 1935/12/10  SIMULATION AND TREATMENT PLANNING NOTE    Outpatient  DIAGNOSIS:     ICD-10-CM   1. Malignant neoplasm of lower-outer quadrant of left breast of female, estrogen receptor positive ([place]) C50.512    Z17.0   2. Malignant neoplasm of upper-inner quadrant of right breast in female, estrogen receptor positive ([place]) C50.211    Z17.0     NARRATIVE:  The patient was brought to the [facility].  Identity was confirmed.  All relevant records and images related to the planned course of therapy were reviewed.  The patient freely provided informed written consent to proceed with treatment after reviewing the details related to the planned course of therapy. The consent form was witnessed and verified by the simulation staff.    Then, the patient was set-up in a stable reproducible supine position for radiation therapy with her ipsilateral arm over her head, and her upper body secured in a custom-made Vac-lok device.  CT images were obtained.  Surface markings were placed.  The CT images were loaded into the planning software.    TREATMENT PLANNING NOTE: Treatment planning then occurred.  The radiation prescription was entered and confirmed.     A total of 5 medically necessary complex treatment devices were fabricated and supervised by me: 4 fields with MLCs for custom blocks to protect heart, and lungs;  and, a Vac-lok. MORE COMPLEX DEVICES MAY BE MADE IN DOSIMETRY FOR FIELD IN FIELD BEAMS FOR DOSE HOMOGENEITY.  I have requested : 3D Simulation which is medically necessary to give adequate dose to at risk tissues while sparing lungs and heart.  I have requested a DVH of the following structures: lungs, heart, bilateral lumpectomy cavities.    The patient will receive 42.56 Gy in 16 fractions to the bilateral breasts with 2 tangential fields  over each breast. This will not be followed by a boost.  Optical Surface Tracking Plan:  Since intensity modulated radiotherapy (IMRT) and 3D conformal radiation treatment methods are predicated on accurate and precise positioning for treatment, intrafraction motion monitoring is medically necessary to ensure accurate and safe treatment delivery. The ability to quantify intrafraction motion without excessive ionizing radiation dose can only be performed with optical surface tracking. Accordingly, surface imaging offers the opportunity to obtain 3D measurements of patient position throughout IMRT and 3D treatments without excessive radiation exposure. I am ordering optical surface tracking for this patient's upcoming course of radiotherapy.  ________________________________   Reference:  Ursula Alert, J, et al. Surface imaging-based analysis of intrafraction motion for breast radiotherapy patients.Journal of [place], n. 6, nov. 2014. ISSN [id number].  Available at: <http://www.jacmp.org/index.php/jacmp/article/view/4957>.    -----------------------------------  Eppie Gibson, MD

## 2016-11-17 ENCOUNTER — Telehealth: Payer: Self-pay | Admitting: Hematology

## 2016-11-17 NOTE — Telephone Encounter (Signed)
Scheduled appt sch message -patient is aware of appt added .

## 2016-11-18 ENCOUNTER — Ambulatory Visit
Admission: RE | Admit: 2016-11-18 | Discharge: 2016-11-18 | Disposition: A | Discharge status: Home / Self Care | Payer: PPO | Source: Ambulatory Visit | Attending: Radiation Oncology | Admitting: Radiation Oncology

## 2016-11-18 DIAGNOSIS — Z170 Estrogen receptor positive status [ER+]: Principal | ICD-10-CM

## 2016-11-18 DIAGNOSIS — C50512 Malignant neoplasm of lower-outer quadrant of left female breast: Secondary | ICD-10-CM | POA: Diagnosis not present

## 2016-11-18 DIAGNOSIS — C50211 Malignant neoplasm of upper-inner quadrant of right female breast: Secondary | ICD-10-CM

## 2016-11-18 DIAGNOSIS — Z510 Encounter for antineoplastic radiation therapy: Secondary | ICD-10-CM | POA: Diagnosis not present

## 2016-11-18 MED ORDER — ALRA NON-METALLIC DEODORANT (RAD-ONC)
1.0000 "application " | Freq: Once | TOPICAL | Status: AC
  Administered 2016-11-18: 1 via TOPICAL

## 2016-11-18 MED ORDER — RADIAPLEXRX EX GEL
Freq: Once | CUTANEOUS | Status: AC
  Administered 2016-11-18: 15:00:00 via TOPICAL

## 2016-11-18 NOTE — Progress Notes (Signed)

## 2016-11-19 ENCOUNTER — Ambulatory Visit
Admission: RE | Admit: 2016-11-19 | Discharge: 2016-11-19 | Disposition: A | Discharge status: Home / Self Care | Payer: PPO | Source: Ambulatory Visit | Attending: Radiation Oncology | Admitting: Radiation Oncology

## 2016-11-19 DIAGNOSIS — C50211 Malignant neoplasm of upper-inner quadrant of right female breast: Secondary | ICD-10-CM | POA: Diagnosis not present

## 2016-11-19 DIAGNOSIS — C50512 Malignant neoplasm of lower-outer quadrant of left female breast: Secondary | ICD-10-CM | POA: Diagnosis not present

## 2016-11-19 DIAGNOSIS — Z510 Encounter for antineoplastic radiation therapy: Secondary | ICD-10-CM | POA: Diagnosis not present

## 2016-11-20 ENCOUNTER — Ambulatory Visit
Admission: RE | Admit: 2016-11-20 | Discharge: 2016-11-20 | Disposition: A | Discharge status: Home / Self Care | Payer: PPO | Source: Ambulatory Visit | Attending: Radiation Oncology | Admitting: Radiation Oncology

## 2016-11-20 DIAGNOSIS — C50211 Malignant neoplasm of upper-inner quadrant of right female breast: Secondary | ICD-10-CM | POA: Diagnosis not present

## 2016-11-20 DIAGNOSIS — Z510 Encounter for antineoplastic radiation therapy: Secondary | ICD-10-CM | POA: Diagnosis not present

## 2016-11-20 DIAGNOSIS — C50512 Malignant neoplasm of lower-outer quadrant of left female breast: Secondary | ICD-10-CM | POA: Diagnosis not present

## 2016-11-21 ENCOUNTER — Ambulatory Visit
Admission: RE | Admit: 2016-11-21 | Discharge: 2016-11-21 | Disposition: A | Discharge status: Home / Self Care | Payer: PPO | Source: Ambulatory Visit | Attending: Radiation Oncology | Admitting: Radiation Oncology

## 2016-11-21 DIAGNOSIS — C50512 Malignant neoplasm of lower-outer quadrant of left female breast: Secondary | ICD-10-CM | POA: Diagnosis not present

## 2016-11-21 DIAGNOSIS — C50211 Malignant neoplasm of upper-inner quadrant of right female breast: Secondary | ICD-10-CM | POA: Diagnosis not present

## 2016-11-21 DIAGNOSIS — Z510 Encounter for antineoplastic radiation therapy: Secondary | ICD-10-CM | POA: Diagnosis not present

## 2016-11-24 ENCOUNTER — Ambulatory Visit: Admission: RE | Admit: 2016-11-24 | Payer: PPO | Source: Ambulatory Visit

## 2016-11-24 ENCOUNTER — Ambulatory Visit
Admission: RE | Admit: 2016-11-24 | Discharge: 2016-11-24 | Disposition: A | Discharge status: Home / Self Care | Payer: PPO | Source: Ambulatory Visit | Attending: Radiation Oncology | Admitting: Radiation Oncology

## 2016-11-24 DIAGNOSIS — C50512 Malignant neoplasm of lower-outer quadrant of left female breast: Secondary | ICD-10-CM | POA: Diagnosis not present

## 2016-11-24 DIAGNOSIS — Z79899 Other long term (current) drug therapy: Secondary | ICD-10-CM | POA: Diagnosis not present

## 2016-11-24 DIAGNOSIS — E785 Hyperlipidemia, unspecified: Secondary | ICD-10-CM | POA: Diagnosis not present

## 2016-11-24 DIAGNOSIS — Z510 Encounter for antineoplastic radiation therapy: Secondary | ICD-10-CM | POA: Diagnosis not present

## 2016-11-24 DIAGNOSIS — C50911 Malignant neoplasm of unspecified site of right female breast: Secondary | ICD-10-CM | POA: Diagnosis not present

## 2016-11-24 DIAGNOSIS — E119 Type 2 diabetes mellitus without complications: Secondary | ICD-10-CM | POA: Diagnosis not present

## 2016-11-24 DIAGNOSIS — I10 Essential (primary) hypertension: Secondary | ICD-10-CM | POA: Diagnosis not present

## 2016-11-24 DIAGNOSIS — C50912 Malignant neoplasm of unspecified site of left female breast: Secondary | ICD-10-CM | POA: Diagnosis not present

## 2016-11-24 DIAGNOSIS — C50211 Malignant neoplasm of upper-inner quadrant of right female breast: Secondary | ICD-10-CM | POA: Diagnosis not present

## 2016-11-24 DIAGNOSIS — E876 Hypokalemia: Secondary | ICD-10-CM | POA: Diagnosis not present

## 2016-11-25 ENCOUNTER — Ambulatory Visit
Admission: RE | Admit: 2016-11-25 | Discharge: 2016-11-25 | Disposition: A | Discharge status: Home / Self Care | Payer: PPO | Source: Ambulatory Visit | Attending: Radiation Oncology | Admitting: Radiation Oncology

## 2016-11-25 DIAGNOSIS — C50512 Malignant neoplasm of lower-outer quadrant of left female breast: Secondary | ICD-10-CM | POA: Diagnosis not present

## 2016-11-25 DIAGNOSIS — Z510 Encounter for antineoplastic radiation therapy: Secondary | ICD-10-CM | POA: Diagnosis not present

## 2016-11-25 DIAGNOSIS — C50211 Malignant neoplasm of upper-inner quadrant of right female breast: Secondary | ICD-10-CM | POA: Diagnosis not present

## 2016-11-26 ENCOUNTER — Ambulatory Visit
Admission: RE | Admit: 2016-11-26 | Discharge: 2016-11-26 | Disposition: A | Discharge status: Home / Self Care | Payer: PPO | Source: Ambulatory Visit | Attending: Radiation Oncology | Admitting: Radiation Oncology

## 2016-11-26 DIAGNOSIS — C50211 Malignant neoplasm of upper-inner quadrant of right female breast: Secondary | ICD-10-CM | POA: Diagnosis not present

## 2016-11-26 DIAGNOSIS — Z510 Encounter for antineoplastic radiation therapy: Secondary | ICD-10-CM | POA: Diagnosis not present

## 2016-11-26 DIAGNOSIS — C50512 Malignant neoplasm of lower-outer quadrant of left female breast: Secondary | ICD-10-CM | POA: Diagnosis not present

## 2016-11-27 ENCOUNTER — Ambulatory Visit
Admission: RE | Admit: 2016-11-27 | Discharge: 2016-11-27 | Disposition: A | Discharge status: Home / Self Care | Payer: PPO | Source: Ambulatory Visit | Attending: Radiation Oncology | Admitting: Radiation Oncology

## 2016-11-27 DIAGNOSIS — Z510 Encounter for antineoplastic radiation therapy: Secondary | ICD-10-CM | POA: Diagnosis not present

## 2016-11-27 DIAGNOSIS — C50211 Malignant neoplasm of upper-inner quadrant of right female breast: Secondary | ICD-10-CM | POA: Diagnosis not present

## 2016-11-27 DIAGNOSIS — C50512 Malignant neoplasm of lower-outer quadrant of left female breast: Secondary | ICD-10-CM | POA: Diagnosis not present

## 2016-11-27 NOTE — Progress Notes (Signed)
[place]  Telephone:([phone]) [phone] Fax:(336) [phone]  Clinic Follow up Note   Patient Care Team: [name], MD as PCP - General (Internal Medicine) [name], MD as Consulting Physician (General Surgery) Eppie Gibson, MD as Attending Physician (Radiation Oncology) Truitt Merle, MD as Consulting Physician (Hematology) 12/01/2016  CHIEF COMPLAINTS:  Bilateral breast cancer   Oncology History   Cancer Staging Bilateral breast cancer [facility]) Staging form: Breast, AJCC 8th Edition - Clinical: No stage assigned - Unsigned  Breast cancer of lower-outer quadrant of left female breast [facility]) Staging form: Breast, AJCC 8th Edition - Pathologic stage from 10/15/2016: Stage IA (pT1b, pN0, cM0, G1, ER: Positive, PR: Positive, HER2: Negative) - Signed by Truitt Merle, MD on 10/23/2016  Breast cancer of upper-inner quadrant of right female breast [facility]) Staging form: Breast, AJCC 8th Edition - Pathologic stage from 10/15/2016: Stage IA (pT1a, pN0, cM0, G1, ER: Positive, PR: Positive, HER2: Negative) - Signed by Truitt Merle, MD on 10/23/2016       Bilateral breast cancer ([place])   08/14/2016 Mammogram    IMPRESSION: Further evaluation is suggested for possible distortion in the right breast.  Further evaluation is suggested for possible distortion in the left breast.      08/26/2016 Mammogram    IMPRESSION: 1. There is a suspicious mass in the right breast at 12:30, which is favored to correspond with the distortion identified mammographically.  2.  There is a suspicious mass in the right breast at 1:30.  3. There is an indeterminate elongated mass in the right breast at 12 o'clock.  4. There is an irregular mass in the left breast at 3:30 which is indeterminate. This could be related to the patient's prior surgery, however malignancy cannot be excluded.  5.  No evidence of bilateral lymphadenopathy.      09/09/2016 Pathology Results    Diagnosis 1. Breast,  right, needle core biopsy, 12:30 4 cm fn INVASIVE DUCTAL CARCINOMA, GRADE 1 DUCTAL CARCINOMA IN SITU IS PRESENT 2. Breast, right, needle core biopsy, 1:30 3 cm fn FIBROADENOMA 3. Breast, left, needle core biopsy, 3:30 6 cm fn INVASIVE DUCTAL CARCINOMA, GRADE 1      09/09/2016 Initial Diagnosis    Bilateral breast cancer ([place])      09/09/2016 Receptors her2    Left breast cancer ER 100%+, PR 100%+, HER2-, Ki67 10% Right breast cancer ER 100%+, PR 90%+, HER2-, Ki67 2%      10/07/2016 Pathology Results    Diagnosis Breast, right, needle core biopsy, 12:00 o'clock - FIBROADENOMA. - THERE IS NO EVIDENCE OF MALIGNANCY. - SEE COMMENT.      10/15/2016 Pathology Results    Diagnosis 1. Breast, lumpectomy, Right w/ bracketed seed - INVASIVE DUCTAL CARCINOMA, 0.3 CM, MSBR GRADE 1. - MARGINS NOT INVOLVED. - TWO FIBROADENOMAS WITH CALCIFICATIONS. - PREVIOUS BIOPSY SITES AND CLIPS. 2. Breast, lumpectomy, Left w/seed - INVASIVE DUCTAL CARCINOMA, 1 CM, MSBR GRADE 1. - DUCTAL CARCINOMA IN SITU. - MARGINS NOT INVOLVED. - CLOSEST MARGIN INFERIOR AT 0.15 CM. - PREVIOUS BIOPSY SITE AND CLIPS. 3. Lymph node, sentinel, biopsy, Left axillary #1 - ONE BENIGN LYMPH NODE (0/1). 4. Lymph node, sentinel, biopsy, Left axillary #2 - ONE BENIGN LYMPH NODE (0/1). 5. Lymph node, sentinel, biopsy, Left axillary #3 - BENIGN ADIPOSE TISSUE. - NO LYMPH NODE TISSUE OR MALIGNANCY. 6. Lymph node, sentinel, biopsy, Right axillary #1 - ONE BENIGN LYMPH NODE (0/1). 7. Lymph node, sentinel, biopsy, Right axillary 32 - ONE BENIGN LYMPH NODE (0/1).  8. Lymph node, sentinel, biopsy, Right axillary - ONE BENIGN LYMPH NODE (0/1) 9. Lymph node, sentinel, biopsy, Right axillary - ONE BENIGN LYMPH NODE (0/1). 10. Lymph node, sentinel, biopsy, Right axillary - ONE BENIGN LYMPH NODE (0/1).      10/15/2016 Surgery    Patient recived a bilateral breast lumpectomy performed by Dr. Barry Dienes       Breast cancer of  lower-outer quadrant of left female breast ([place])   10/23/2016 Initial Diagnosis    Breast cancer of lower-outer quadrant of left female breast [facility])      Breast cancer of upper-inner quadrant of right female breast ([place])   10/23/2016 Initial Diagnosis    Breast cancer of upper-inner quadrant of right female breast ([place])       HISTORY OF PRESENTING ILLNESS:  Julie Sosa 81 y.o. female is here because of newly diagnosed bilateral breast cancer. She was referred by her breast surgeon Dr. Barry Dienes.  Patient undergo a screening mammogram on 08/14/2016 with showed abnormality in both breasts. She has had previous abnormal mammograms that were reported benign. Diagnostic imaging showed a 5 mm area at 34 cm from the nipple and a 8 mm area at 133 cm from the nipple. She had normal-appearing lymph nodes in her right breast and on her left, she had a 2 cm area 6cm in the nipple at 3:30 as well as abnormal lymph nodes. She subsequently underwent core needle biopsy of 2 spots on the right. One showed invasive, ductal carcinoma, grade 1 with DCIS and the other was fibroadenoma. Biopsy of the left-sided lesion showed invasive ductal carcinoma grade 1. Prognostic panel was done on the right breast cancer and was ER and PR strongly positive, HER-2 negative and Ki-67 of 2%.  Before surgery, she denied any unexpected weight lost. She did have some weight loss before due to her bilateral hip surgery on 3/19.  She is here today to discuss preventive care accompanied by her husband. She has some pain in her right breast cancer where her lymph nodes were removed. She uses ice packs and tighter bras to help manage the pain.  CURRENT THERAPY: Adjuvant breast radiation  INTERIM HISTORY:  Pt presents today accompanied by her husband. She is in her third week of radiation tx and has 7 left. Pt notices very little redness. The cream she was given alleviates the mild erythema. Pt has not been fatigued, notes she is  exhausted by 8 pm each night. Arthritis has improved after surgery. Pt is going tomorrow, 12/02/16 for a follow up for her cholesterol, which she checks once a year.  On review of systems, pt denies fatigue, hot flashes, fever, chills, weight loss, decreased appetite, decreased energy levels. Denies pain. Pt denies abdominal pain, nausea, vomiting. Endorses mild erythema   MEDICAL HISTORY:  Past Medical History:  Diagnosis Date  . Anemia   . Anxiety   . Arthritis    osteoarthritis  . Cancer ([place])    melanoma right arm  . Depression   . GERD (gastroesophageal reflux disease)    occasionally uses tums or rolaids  . Headache   . Hypercholesteremia   . Hypertension   . Restless leg syndrome     SURGICAL HISTORY: Past Surgical History:  Procedure Laterality Date  . ABDOMINAL HYSTERECTOMY    . BREAST LUMPECTOMY WITH RADIOACTIVE SEED AND SENTINEL LYMPH NODE BIOPSY Bilateral 10/15/2016   Procedure: RIGHT BREAST LUMPECTOMY WITH BRACKETED RADIOACTIVE SEEDS AND RIGHT SENTINEL LYMPH NODE BIOPSY, LEFT BREAST LUMPECTOMY WITH  RADIOACTIVE SEED AND LEFT SENTINEL LYMPH NODE BIOPSY;  Surgeon: [name], MD;  Location: [facility];  Service: General;  Laterality: Bilateral;  . BREAST SURGERY     left breast cyst removed  . CATARACT EXTRACTION W/PHACO  03/11/2012   Procedure: CATARACT EXTRACTION PHACO AND INTRAOCULAR LENS PLACEMENT (IOC);  Surgeon: Tonny Branch, MD;  Location: [facility];  Service: Ophthalmology;  Laterality: Right;  CDE:17.71  . CATARACT EXTRACTION W/PHACO  03/29/2012   Procedure: CATARACT EXTRACTION PHACO AND INTRAOCULAR LENS PLACEMENT (IOC);  Surgeon: Tonny Branch, MD;  Location: [facility];  Service: Ophthalmology;  Laterality: Left;  CDE:17.49  . CHOLECYSTECTOMY  10 yrs ago   Arnoldo Morale  . EYE SURGERY Bilateral    cataract extraction  . MUSCLE BIOPSY     removal of cyst-right calf  . TOTAL HIP ARTHROPLASTY Left 12/25/2014   Procedure: TOTAL HIP ARTHROPLASTY;  Surgeon: Dereck Leep, MD;   Location: [facility];  Service: Orthopedics;  Laterality: Left;  . TOTAL HIP ARTHROPLASTY Right 06/23/2016   Procedure: TOTAL HIP ARTHROPLASTY;  Surgeon: Dereck Leep, MD;  Location: [facility];  Service: Orthopedics;  Laterality: Right;  . YAG LASER APPLICATION Right 12/04/5619   Procedure: YAG LASER APPLICATION;  Surgeon: Williams Che, MD;  Location: [facility];  Service: Ophthalmology;  Laterality: Right;  . YAG LASER APPLICATION Left 06/12/6576   Procedure: YAG LASER APPLICATION;  Surgeon: Williams Che, MD;  Location: [facility];  Service: Ophthalmology;  Laterality: Left;    SOCIAL HISTORY: Social History   Social History  . Marital status: Married    Spouse name: N/A  . Number of children: N/A  . Years of education: N/A   Occupational History  . Not on file.   Social History Main Topics  . Smoking status: Never Smoker  . Smokeless tobacco: Never Used  . Alcohol use No  . Drug use: No  . Sexual activity: Yes    Birth control/ protection: Surgical   Other Topics Concern  . Not on file   Social History Narrative  . No narrative on file    FAMILY HISTORY: Family History  Problem Relation Age of Onset  . Cancer Brother        lung cancer   . Cancer Other 30       breast cancer   . Cancer Sister 82       breast cancer     ALLERGIES:  is allergic to cefuroxime.  MEDICATIONS:  Current Outpatient Prescriptions  Medication Sig Dispense Refill  . anagrelide (AGRYLIN) 1 MG capsule Take 1 capsule (1 mg total) by mouth 2 (two) times daily. 30 capsule 2  . atenolol (TENORMIN) 50 MG tablet Take 50 mg by mouth daily. In am.    . hydrochlorothiazide (HYDRODIURIL) 25 MG tablet Take 25 mg by mouth daily. In am.    . oxyCODONE (OXY IR/ROXICODONE) 5 MG immediate release tablet Take 1-2 tablets (5-10 mg total) by mouth every 4 (four) hours as needed for severe pain. (Patient not taking: Reported on 10/28/2016) 20 tablet 0  . potassium chloride SA (K-DUR,KLOR-CON) 20 MEQ tablet Take  20-40 mEq by mouth 2 (two) times daily. 40 MEQ in the morning & 20 MEQ at night after supper    . rOPINIRole (REQUIP) 1 MG tablet Take 1 mg by mouth daily at 8 pm.     . sertraline (ZOLOFT) 100 MG tablet Take 100 mg by mouth daily. In am.    . simvastatin (ZOCOR) 20 MG  tablet Take 20 mg by mouth daily. In am.    . traMADol (ULTRAM) 50 MG tablet Take 1-2 tablets (50-100 mg total) by mouth every 4 (four) hours as needed for moderate pain. (Patient not taking: Reported on 10/06/2016) 60 tablet 0  . traZODone (DESYREL) 100 MG tablet Take 100 mg by mouth at bedtime.   2   No current facility-administered medications for this visit.    GYN HISTORY  Menarchal: 14 LMP: 31 Contraceptive: HRT:  GP:2  REVIEW OF SYSTEMS:   Constitutional: Denies fevers, chills or abnormal night sweats Eyes: Denies blurriness of vision, double vision or watery eyes Ears, nose, mouth, throat, and face: Denies mucositis or sore throat Respiratory: Denies cough, dyspnea or wheezes Cardiovascular: Denies palpitation, chest discomfort or lower extremity swelling Gastrointestinal:  Denies nausea, heartburn or change in bowel habits Skin: Denies abnormal skin rashes, mild erythmea Lymphatics: Denies new lymphadenopathy or easy bruising Neurological:Denies numbness, tingling or new weaknesses Behavioral/Psych: Mood is stable, no new changes  All other systems were reviewed with the patient and are negative.  PHYSICAL EXAMINATION:  ECOG PERFORMANCE STATUS: 0 - Asymptomatic  Vitals:   12/01/16 1615  BP: (!) 128/56  Pulse: (!) 59  Resp: 18  Temp: 98.1 F (36.7 C)  SpO2: 100%   Filed Weights   12/01/16 1615  Weight: 170 lb 8 oz (77.3 kg)    GENERAL:alert, no distress and comfortable SKIN: skin color, texture, turgor are normal, no rashes or significant lesions EYES: normal, conjunctiva are pink and non-injected, sclera clear OROPHARYNX:no exudate, no erythema and lips, buccal mucosa, and tongue normal  NECK:  supple, thyroid normal size, non-tender, without nodularity LYMPH:  no palpable lymphadenopathy in the cervical, axillary or inguinal LUNGS: clear to auscultation and percussion with normal breathing effort HEART: regular rate & rhythm and no murmurs and no lower extremity edema ABDOMEN:abdomen soft, non-tender and normal bowel sounds Musculoskeletal:no cyanosis of digits and no clubbing  PSYCH: alert & oriented x 3 with fluent speech NEURO: no focal motor/sensory deficits  LABORATORY DATA:  I have reviewed the data as listed CBC Latest Ref Rng & Units 10/10/2016 06/25/2016 06/24/2016  WBC 4.0 - 10.5 K/uL 5.6 7.5 6.7  Hemoglobin 12.0 - 15.0 g/dL 12.0 10.0(L) 9.6(L)  Hematocrit 36.0 - 46.0 % 37.4 28.6(L) 27.4(L)  Platelets 150 - 400 K/uL 198 148(L) 144(L)   PATHOLOGY Diagnosis 10/15/2016 1. Breast, lumpectomy, Right w/ bracketed seed - INVASIVE DUCTAL CARCINOMA, 0.3 CM, MSBR GRADE 1. - MARGINS NOT INVOLVED. - TWO FIBROADENOMAS WITH CALCIFICATIONS. - PREVIOUS BIOPSY SITES AND CLIPS. 2. Breast, lumpectomy, Left w/seed - INVASIVE DUCTAL CARCINOMA, 1 CM, MSBR GRADE 1. - DUCTAL CARCINOMA IN SITU. - MARGINS NOT INVOLVED. - CLOSEST MARGIN INFERIOR AT 0.15 CM. - PREVIOUS BIOPSY SITE AND CLIPS. 3. Lymph node, sentinel, biopsy, Left axillary #1 - ONE BENIGN LYMPH NODE (0/1). 4. Lymph node, sentinel, biopsy, Left axillary #2 - ONE BENIGN LYMPH NODE (0/1). 5. Lymph node, sentinel, biopsy, Left axillary #3 - BENIGN ADIPOSE TISSUE. - NO LYMPH NODE TISSUE OR MALIGNANCY. 6. Lymph node, sentinel, biopsy, Right axillary #1 - ONE BENIGN LYMPH NODE (0/1). 7. Lymph node, sentinel, biopsy, Right axillary 32 - ONE BENIGN LYMPH NODE (0/1). 8. Lymph node, sentinel, biopsy, Right axillary - ONE BENIGN LYMPH NODE (0/1) 9. Lymph node, sentinel, biopsy, Right axillary - ONE BENIGN LYMPH NODE (0/1). 10. Lymph node, sentinel, biopsy, Right axillary - ONE BENIGN LYMPH NODE (0/1). 1 of 6 FINAL for Julie Sosa, Julie Sosa ([id number])  Microscopic Comment 1. BREAST, INVASIVE TUMOR Procedure: Localized lumpectomy with five sentinel lymph nodes. Laterality: Right breast. Tumor Size: 0.3 cm. Histologic Type: Ductal Grade: I Tubular Differentiation: 1 Nuclear Pleomorphism: 1 Mitotic Count: 1 Ductal Carcinoma in Situ (DCIS): Present, low grade. Extent of Tumor: Skin: N/A Nipple: N/A Skeletal muscle: N/A Margins: Free of tumor. Invasive carcinoma, distance from closest margin: 1 cm from superior margin. DCIS, distance from closest margin: N/A Regional Lymph Nodes: Number of Lymph Nodes Examined: 5 Number of Sentinel Lymph Nodes Examined: 5 Lymph Nodes with Macrometastases: 0 Lymph Nodes with Micrometastases: 0 Lymph Nodes with Isolated Tumor Cells: Breast Prognostic Profile: Case number [id number] Estrogen Receptor: 100%, positive, strong staining. Progesterone Receptor: 90%, positive, strong staining. Her2: Negative, ratio 1.31. Ki-67: 2% Best tumor block for sendout testing: 1I Pathologic Stage Classification (pTNM, AJCC 8th Edition): Primary Tumor (pT): pT1a Regional Lymph Nodes (pN): pN0 Distant Metastases (pM): pMX Comments: There is a 0.3 cm residual invasive ductal carcinoma located at the biopsy site with the ribbon shaped biopsy clip. There are fibroadenomas with calcifications located at the coil shaped biopsy clip and the wing shaped biopsy clip. 2. BREAST, INVASIVE TUMOR Procedure: Localized lumpectomy and two sentinel lymph nodes. Laterality: Left breast. Tumor Size: 1 cm Histologic Type: Ductal. Grade: I Tubular Differentiation: 1 Nuclear Pleomorphism: 1 Mitotic Count: 1 Ductal Carcinoma in Situ (DCIS): Present, low grade. Extent of Tumor: Skin: N/A Nipple: N/A Skeletal muscle: N/A 2 of 6 FINAL for Julie Sosa, Julie Sosa ([id number]) Microscopic Comment(continued) Margins: Free of tumor. Invasive carcinoma, distance from closest margin: 0.15 cm from inferior  margin. DCIS, distance from closest margin: 0.3 cm from inferior margin. Regional Lymph Nodes: Number of Lymph Nodes Examined: 2 Number of Sentinel Lymph Nodes Examined: 2 Lymph Nodes with Macrometastases: 0 Lymph Nodes with Micrometastases: 0 Lymph Nodes with Isolated Tumor Cells: 0 Breast Prognostic Profile: [id number] Estrogen Receptor: 100%, positive, strong staining. Progesterone Receptor: 100%, positive, strong staining. Her2: Negative, ratio 1.26 Ki-67: 10% Best tumor block for sendout testing: 2A Pathologic Stage Classification (pTNM, AJCC 8th Edition): Primary Tumor (pT): pT1b Regional Lymph Nodes (pN): pN0  Diagnosis 10/07/2016 Breast, right, needle core biopsy, 12:00 o'clock - FIBROADENOMA. - THERE IS NO EVIDENCE OF MALIGNANCY. - SEE COMMENT.  Diagnosis 09/08/2016 1. Breast, right, needle core biopsy, 12:30 4 cm fn INVASIVE DUCTAL CARCINOMA, GRADE 1 DUCTAL CARCINOMA IN SITU IS PRESENT 2. Breast, right, needle core biopsy, 1:30 3 cm fn FIBROADENOMA 3. Breast, left, needle core biopsy, 3:30 6 cm fn INVASIVE DUCTAL CARCINOMA, GRADE 1 Microscopic Comment 1. Immunostains for p63, calponin, SMM-1 and ck5/6 are negative, positive for ER supporting the diagnosis of invasive ductal carcinoma.  RADIOGRAPHIC STUDIES: I have personally reviewed the radiological images as listed and agreed with the findings in the report. Dg Bone Density  Result Date: 11/07/2016 EXAM: DUAL X-RAY ABSORPTIOMETRY (DXA) FOR BONE MINERAL DENSITY IMPRESSION: Referring Physician:  Truitt Merle PATIENT: Name: Julie Sosa, Julie Sosa Patient ID: [id number] Birth Date: 03/23/1936 Height: 61.5 in. Sex: Female Measured: 11/07/2016 Weight: 170.7 lbs. Indications: Advanced Age, Breast Cancer History, Caucasian, Estrogen Deficient, Hysterectomy, Left hip replaced, Postmenopausal, Right hip replacement Fractures: None Treatments: None ASSESSMENT: The BMD measured at Forearm Radius 33% is 0.847 g/cm2 with a T-score of -0.5.This  patient is considered normal according to [facility]) criteria. Right femur was excluded due to surgical hardware. Left femur was excluded due to surgical hardware. Site Region Measured Date Measured Age [age]A BMD Significant CHANGE T-score Left Forearm Radius 33% 11/07/2016  78.8 -0.5 0.847 g/cm2 [facility] Spine L1-L4 11/07/2016 78.8 0.9 1.286 g/cm2 World Health Organization [facility]) criteria for post-menopausal, Caucasian Women: Normal       T-score at or above -1 SD Osteopenia   T-score between -1 and -2.5 SD Osteoporosis T-score at or below -2.5 SD RECOMMENDATION: [place] recommends that FDA-approved medical therapies be considered in postmenopausal women and men age 98 or older with a: 1. Hip or vertebral (clinical or morphometric) fracture. 2. T-score of <-2.5 at the spine or hip. 3. Ten-year fracture probability by FRAX of 3% or greater for hip fracture or 20% or greater for major osteoporotic fracture. All treatment decisions require clinical judgment and consideration of individual patient factors, including patient preferences, co-morbidities, previous drug use, risk factors not captured in the FRAX model (e.g. falls, vitamin D deficiency, increased bone turnover, interval significant decline in bone density) and possible under - or over-estimation of fracture risk by FRAX. All patients should ensure an adequate intake of dietary calcium (1200 mg/d) and vitamin D (800 IU daily) unless contraindicated. FOLLOW-UP: People with diagnosed cases of osteoporosis or at high risk for fracture should have regular bone mineral density tests. For patients eligible for Medicare, routine testing is allowed once every 2 years. The testing frequency can be increased to one year for patients who have rapidly progressing disease, those who are receiving or discontinuing medical therapy to restore bone mass, or have additional risk factors. I have reviewed this report, and agree with the above  findings. [facility] Radiology Electronically Signed   By: Rolm Baptise M.D.   On: 11/07/2016 14:11    ASSESSMENT & PLAN: 81 y.o. postmenopausal woman, presented with screening discovered bilateral breast cancer.   1. Bilateral Breast Cancer, upper-outer quadrant of left breast, pT1bN0M0, stage IA, and upper inner quadrant of right breast,  pT1aN0M0, stage IA, both invasive ductal carcinoma, grade 1, ER and PR strongly positive, HER-2 negative -I have reviewed her initial image findings, biopsy results, and her final surgical pathology results, discussed with patient in details. -She had bilateral invasive ductal carcinoma, post very early stage, with tumor <1.0cm, low-grade, I think her risk of recurrence is small given the strongly ER/PR positive and HER-2 negative disease, I do not think we need Oncotype. No adjuvant chemotherapy is needed. - Given the strong ER and PR positivity and her post menopause status, I do recommend adjuvant aromatase inhibitor to reduce her risk of cancer recurrence,  The potential benefit and side effects, which includes but not limited to, hot flash, skin and vaginal dryness, metabolic changes ( increased blood glucose, cholesterol, weight, etc.), slightly in increased risk of cardiovascular disease, cataracts, muscular and joint discomfort, osteopenia and osteoporosis, etc, were discussed with her in great details. She is interested, and we'll start after she completes radiation, in about 3 weeks  -She does have arthritis, if she has significant arthralgia from aromatase inhibitor, I'll switch her to tamoxifen. - Pt is in her third week of radiation as of 12/01/16, plan to complete on 12/11/2016 --We discussed the other risk factors for breast cancer. I recommend her to avoid any estrogen or progesterone containing supplements, healthy diet, and exercise regularly. She 81 year old, but still fairly physically active. -Continue breast cancer surveillance.  2. Genetics  -  Due to family history of breast cancer and her personal history of bilateral breast cancer, I recommend genetic counseling. She is willing to consider, but wants to wait right now before scheduling an appointment. -will revisit this in  the future  3. Bone health  - Pt had a bone density scan on 11/07/16. -Discussed the results with pt, which ws normal -We discussed the potential side effects of osteopenia and also process from aromatase inhibitor, we'll monitor her bone density closely when she is on aromatase inhibitor.   4. Arthritis  -Follow up with PCP  5. Depression  - takes Zoloft  PLAN -She will complete adjuvant breast radiation on 12/11/2016  -Plan to start anastrozole 1 mg daily in 3 weeks  -lab and f/u in 3 months  No orders of the defined types were placed in this encounter.   All questions were answered. The patient knows to call the clinic with any problems, questions or concerns. I spent 25 minutes counseling the patient face to face. The total time spent in the appointment was 30 minutes and more than 50% was on counseling.   This document serves as a record of services personally performed by Truitt Merle, MD. It was created on her behalf by Margit Banda, a trained medical scribe. The creation of this record is based on the scribe's personal observations and the provider's statements to them. This document has been checked and approved by the attending provider.  Truitt Merle 12/01/2016

## 2016-11-28 ENCOUNTER — Ambulatory Visit
Admission: RE | Admit: 2016-11-28 | Discharge: 2016-11-28 | Disposition: A | Discharge status: Home / Self Care | Payer: PPO | Source: Ambulatory Visit | Attending: Radiation Oncology | Admitting: Radiation Oncology

## 2016-11-28 DIAGNOSIS — C50211 Malignant neoplasm of upper-inner quadrant of right female breast: Secondary | ICD-10-CM | POA: Diagnosis not present

## 2016-11-28 DIAGNOSIS — C50512 Malignant neoplasm of lower-outer quadrant of left female breast: Secondary | ICD-10-CM | POA: Diagnosis not present

## 2016-11-28 DIAGNOSIS — Z510 Encounter for antineoplastic radiation therapy: Secondary | ICD-10-CM | POA: Diagnosis not present

## 2016-12-01 ENCOUNTER — Ambulatory Visit (HOSPITAL_BASED_OUTPATIENT_CLINIC_OR_DEPARTMENT_OTHER): Payer: PPO | Admitting: Hematology

## 2016-12-01 ENCOUNTER — Telehealth: Payer: Self-pay | Admitting: Hematology

## 2016-12-01 ENCOUNTER — Other Ambulatory Visit: Payer: Self-pay | Admitting: Hematology

## 2016-12-01 ENCOUNTER — Encounter: Payer: Self-pay | Admitting: Hematology

## 2016-12-01 ENCOUNTER — Ambulatory Visit
Admission: RE | Admit: 2016-12-01 | Discharge: 2016-12-01 | Disposition: A | Discharge status: Home / Self Care | Payer: PPO | Source: Ambulatory Visit | Attending: Radiation Oncology | Admitting: Radiation Oncology

## 2016-12-01 VITALS — BP 128/56 | HR 59 | Temp 98.1°F | Resp 18 | Ht 63.75 in | Wt 170.5 lb

## 2016-12-01 DIAGNOSIS — F329 Major depressive disorder, single episode, unspecified: Secondary | ICD-10-CM | POA: Diagnosis not present

## 2016-12-01 DIAGNOSIS — M1990 Unspecified osteoarthritis, unspecified site: Secondary | ICD-10-CM

## 2016-12-01 DIAGNOSIS — C50512 Malignant neoplasm of lower-outer quadrant of left female breast: Secondary | ICD-10-CM | POA: Diagnosis not present

## 2016-12-01 DIAGNOSIS — Z170 Estrogen receptor positive status [ER+]: Secondary | ICD-10-CM | POA: Diagnosis not present

## 2016-12-01 DIAGNOSIS — C50211 Malignant neoplasm of upper-inner quadrant of right female breast: Secondary | ICD-10-CM | POA: Diagnosis not present

## 2016-12-01 DIAGNOSIS — Z510 Encounter for antineoplastic radiation therapy: Secondary | ICD-10-CM | POA: Diagnosis not present

## 2016-12-01 MED ORDER — ANAGRELIDE HCL 1 MG PO CAPS: 1.0000 mg | ORAL_CAPSULE | Freq: Two times a day (BID) | ORAL | 2 refills | Status: DC

## 2016-12-01 NOTE — Telephone Encounter (Signed)
Gave patient avs and calendar for november appointment

## 2016-12-02 ENCOUNTER — Ambulatory Visit
Admission: RE | Admit: 2016-12-02 | Discharge: 2016-12-02 | Disposition: A | Discharge status: Home / Self Care | Payer: PPO | Source: Ambulatory Visit | Attending: Radiation Oncology | Admitting: Radiation Oncology

## 2016-12-02 ENCOUNTER — Other Ambulatory Visit: Payer: Self-pay | Admitting: Hematology

## 2016-12-02 DIAGNOSIS — C50911 Malignant neoplasm of unspecified site of right female breast: Secondary | ICD-10-CM | POA: Diagnosis not present

## 2016-12-02 DIAGNOSIS — Z510 Encounter for antineoplastic radiation therapy: Secondary | ICD-10-CM | POA: Diagnosis not present

## 2016-12-02 DIAGNOSIS — E119 Type 2 diabetes mellitus without complications: Secondary | ICD-10-CM | POA: Diagnosis not present

## 2016-12-02 DIAGNOSIS — C50211 Malignant neoplasm of upper-inner quadrant of right female breast: Secondary | ICD-10-CM | POA: Diagnosis not present

## 2016-12-02 DIAGNOSIS — C50512 Malignant neoplasm of lower-outer quadrant of left female breast: Secondary | ICD-10-CM | POA: Diagnosis not present

## 2016-12-02 DIAGNOSIS — C50912 Malignant neoplasm of unspecified site of left female breast: Secondary | ICD-10-CM | POA: Diagnosis not present

## 2016-12-02 DIAGNOSIS — Z6832 Body mass index (BMI) 32.0-32.9, adult: Secondary | ICD-10-CM | POA: Diagnosis not present

## 2016-12-02 MED ORDER — ANASTROZOLE 1 MG PO TABS: 1.0000 mg | ORAL_TABLET | Freq: Every day | ORAL | 2 refills | Status: DC

## 2016-12-03 ENCOUNTER — Ambulatory Visit
Admission: RE | Admit: 2016-12-03 | Discharge: 2016-12-03 | Disposition: A | Discharge status: Home / Self Care | Payer: PPO | Source: Ambulatory Visit | Attending: Radiation Oncology | Admitting: Radiation Oncology

## 2016-12-03 DIAGNOSIS — C50211 Malignant neoplasm of upper-inner quadrant of right female breast: Secondary | ICD-10-CM | POA: Diagnosis not present

## 2016-12-03 DIAGNOSIS — C50512 Malignant neoplasm of lower-outer quadrant of left female breast: Secondary | ICD-10-CM | POA: Diagnosis not present

## 2016-12-03 DIAGNOSIS — Z510 Encounter for antineoplastic radiation therapy: Secondary | ICD-10-CM | POA: Diagnosis not present

## 2016-12-04 ENCOUNTER — Ambulatory Visit
Admission: RE | Admit: 2016-12-04 | Discharge: 2016-12-04 | Disposition: A | Discharge status: Home / Self Care | Payer: PPO | Source: Ambulatory Visit | Attending: Radiation Oncology | Admitting: Radiation Oncology

## 2016-12-04 DIAGNOSIS — C50512 Malignant neoplasm of lower-outer quadrant of left female breast: Secondary | ICD-10-CM | POA: Diagnosis not present

## 2016-12-04 DIAGNOSIS — C50211 Malignant neoplasm of upper-inner quadrant of right female breast: Secondary | ICD-10-CM | POA: Diagnosis not present

## 2016-12-04 DIAGNOSIS — Z510 Encounter for antineoplastic radiation therapy: Secondary | ICD-10-CM | POA: Diagnosis not present

## 2016-12-05 ENCOUNTER — Ambulatory Visit
Admission: RE | Admit: 2016-12-05 | Discharge: 2016-12-05 | Disposition: A | Discharge status: Home / Self Care | Payer: PPO | Source: Ambulatory Visit | Attending: Radiation Oncology | Admitting: Radiation Oncology

## 2016-12-05 DIAGNOSIS — C50211 Malignant neoplasm of upper-inner quadrant of right female breast: Secondary | ICD-10-CM | POA: Diagnosis not present

## 2016-12-05 DIAGNOSIS — C50512 Malignant neoplasm of lower-outer quadrant of left female breast: Secondary | ICD-10-CM | POA: Diagnosis not present

## 2016-12-05 DIAGNOSIS — Z510 Encounter for antineoplastic radiation therapy: Secondary | ICD-10-CM | POA: Diagnosis not present

## 2016-12-09 ENCOUNTER — Ambulatory Visit
Admission: RE | Admit: 2016-12-09 | Discharge: 2016-12-09 | Disposition: A | Discharge status: Home / Self Care | Payer: PPO | Source: Ambulatory Visit | Attending: Radiation Oncology | Admitting: Radiation Oncology

## 2016-12-09 DIAGNOSIS — C50512 Malignant neoplasm of lower-outer quadrant of left female breast: Secondary | ICD-10-CM | POA: Diagnosis not present

## 2016-12-09 DIAGNOSIS — C50211 Malignant neoplasm of upper-inner quadrant of right female breast: Secondary | ICD-10-CM | POA: Diagnosis not present

## 2016-12-09 DIAGNOSIS — Z510 Encounter for antineoplastic radiation therapy: Secondary | ICD-10-CM | POA: Diagnosis not present

## 2016-12-10 ENCOUNTER — Ambulatory Visit
Admission: RE | Admit: 2016-12-10 | Discharge: 2016-12-10 | Disposition: A | Discharge status: Home / Self Care | Payer: PPO | Source: Ambulatory Visit | Attending: Radiation Oncology | Admitting: Radiation Oncology

## 2016-12-10 DIAGNOSIS — C50512 Malignant neoplasm of lower-outer quadrant of left female breast: Secondary | ICD-10-CM | POA: Diagnosis not present

## 2016-12-10 DIAGNOSIS — C50211 Malignant neoplasm of upper-inner quadrant of right female breast: Secondary | ICD-10-CM | POA: Diagnosis not present

## 2016-12-10 DIAGNOSIS — Z510 Encounter for antineoplastic radiation therapy: Secondary | ICD-10-CM | POA: Diagnosis not present

## 2016-12-11 ENCOUNTER — Ambulatory Visit
Admission: RE | Admit: 2016-12-11 | Discharge: 2016-12-11 | Disposition: A | Discharge status: Home / Self Care | Payer: PPO | Source: Ambulatory Visit | Attending: Radiation Oncology | Admitting: Radiation Oncology

## 2016-12-11 ENCOUNTER — Telehealth: Payer: Self-pay | Admitting: Adult Health

## 2016-12-11 ENCOUNTER — Encounter: Payer: Self-pay | Admitting: Radiation Oncology

## 2016-12-11 DIAGNOSIS — Z510 Encounter for antineoplastic radiation therapy: Secondary | ICD-10-CM | POA: Diagnosis not present

## 2016-12-11 DIAGNOSIS — C50211 Malignant neoplasm of upper-inner quadrant of right female breast: Secondary | ICD-10-CM | POA: Diagnosis not present

## 2016-12-11 DIAGNOSIS — C50512 Malignant neoplasm of lower-outer quadrant of left female breast: Secondary | ICD-10-CM | POA: Diagnosis not present

## 2016-12-11 NOTE — Telephone Encounter (Signed)
Spoke with patient regarding the changes in her appt in November.

## 2016-12-16 NOTE — Progress Notes (Signed)
  Radiation Oncology         [phone] ________________________________  Name: Julie Sosa MRN: [id number]  Date: 12/11/2016  DOB: 08/23/1935  End of Treatment Note  Diagnosis:   81 y.o. female with Bilateral Breast Cancer Left Breast Cancer: Stage IA (pT1b, pN0, cM0, G1) invasive ductal carcinoma, ER(+) / PR(+) / HER2(-) Right Breast Cancer: Stage IA (pT1a, pN0, cM0, G1) invasive ductal carcinoma, ER(+) / PR(+) / HER2(-)  Indication for treatment:  Curative       Radiation treatment dates:   11/19/2016 - 12/11/2016  Site/dose:   The left breast was treated to 42.56 Gy in 16 fractions of 2.66 Gy. The right breast was treated to 42.56 Gy in 16 fractions of 2.66 Gy.  Beams/energy:    Left Breast: 3D // 10X, 6X, 15X Photon Right Breast: 3D // 10X, 15X Photon  Narrative: The patient tolerated radiation treatment relatively well and is pleased with how she feels overall.  During the course of her treatment she did have some fatigue. She denied pain except for some "burning" to her right and left axillary regions (R>L). On physical exam, her bilateral breasts were noted to be erythematous. She continues to use RadiaPlex twice daily as directed.    Plan: The patient has completed radiation treatment. The patient will return to radiation oncology clinic for routine followup in one month. I advised them to call or return sooner if they have any questions or concerns related to their recovery or treatment.  -----------------------------------  Eppie Gibson, MD  This document serves as a record of services personally performed by Eppie Gibson, MD. It was created on her behalf by Rae Lips, a trained medical scribe. The creation of this record is based on the scribe's personal observations and the provider's statements to them. This document has been checked and approved by the attending provider.

## 2017-01-21 ENCOUNTER — Ambulatory Visit
Admission: RE | Admit: 2017-01-21 | Discharge: 2017-01-21 | Disposition: A | Discharge status: Home / Self Care | Payer: PPO | Source: Ambulatory Visit | Attending: Radiation Oncology | Admitting: Radiation Oncology

## 2017-01-21 ENCOUNTER — Encounter: Payer: Self-pay | Admitting: Radiation Oncology

## 2017-01-21 DIAGNOSIS — Z79899 Other long term (current) drug therapy: Secondary | ICD-10-CM | POA: Diagnosis not present

## 2017-01-21 DIAGNOSIS — C50812 Malignant neoplasm of overlapping sites of left female breast: Secondary | ICD-10-CM | POA: Diagnosis not present

## 2017-01-21 DIAGNOSIS — Z923 Personal history of irradiation: Secondary | ICD-10-CM | POA: Insufficient documentation

## 2017-01-21 DIAGNOSIS — Z881 Allergy status to other antibiotic agents status: Secondary | ICD-10-CM | POA: Insufficient documentation

## 2017-01-21 DIAGNOSIS — Z170 Estrogen receptor positive status [ER+]: Secondary | ICD-10-CM | POA: Insufficient documentation

## 2017-01-21 DIAGNOSIS — Z79811 Long term (current) use of aromatase inhibitors: Secondary | ICD-10-CM | POA: Diagnosis not present

## 2017-01-21 DIAGNOSIS — C50811 Malignant neoplasm of overlapping sites of right female breast: Secondary | ICD-10-CM

## 2017-01-21 NOTE — Progress Notes (Signed)
Ms. Symmonds presents for follow up of radiation completed 12/11/16 to her left and right breast. She denies pain. She does have continued fatigue. She plans to begin walking to help with her fatigue. The skin to her radiation has healed. She is not using any creams at this time. She is aware she can use vitamin E cream to her radiation site. She is taking anastrozole daily. She has an appointment with survivorship on 03/02/17.   BP 112/68   Pulse 64   Temp 98.3 F (36.8 C)   Ht 5' 3.75" (1.619 m)   Wt 167 lb 12.8 oz (76.1 kg)   SpO2 95% Comment: room air  BMI 29.03 kg/m    Wt Readings from Last 3 Encounters:  01/21/17 167 lb 12.8 oz (76.1 kg)  12/01/16 170 lb 8 oz (77.3 kg)  10/28/16 170 lb 6.4 oz (77.3 kg)

## 2017-01-21 NOTE — Progress Notes (Signed)
Radiation Oncology         [phone]) [phone] ________________________________  Name: Julie Sosa MRN: [id number]  Date: 01/21/2017  DOB: 06/12/1935  Follow-Up Visit  Note  Outpatient  CC: [name], MD  [name], MD  Diagnosis and Prior Radiotherapy:    ICD-10-CM   1. Malignant neoplasm of overlapping sites of both breasts in female, estrogen receptor positive ([place]) C50.811    C50.812    Z17.0     81 y.o. woman with Left breast CA, Stage IA (pT1b, pN0, cM0, G1), Invasive ductal carcinoma, ER positive, PR positive, HER2 negative and Right breast CA, Stage IA (pT1a, pN0, cM0, G1), Invasive ductal carcinoma, ER positive, PR positive, HER2 negative.  Previous Radiation:  42.56 Gy in 16 fractions Completed on 12/11/2016.  11/19/2016 - 12/11/2016  The left breast was treated to 42.56 Gy in 16 fractions of 2.66 Gy. The right breast was treated to 42.56 Gy in 16 fractions of 2.66 Gy.  CHIEF COMPLAINT:  Here for follow-up and surveillance of Left breast cancer, Stage IA (pT1b, pN0, cM0, G1), Invasive ductal carcinoma, ER positive, PR positive, HER2 negative and Right breast CA, Stage IA (pT1a, pN0, cM0, G1), Invasive ductal carcinoma, ER positive, PR positive, HER2 negative.   Narrative: The patient returns today for routine follow-up.  [name] presents for follow up of radiation completed 12/11/16 to her left and right breast. She denies pain. She does have continued fatigue. She plans to begin walking to help with her fatigue. The skin to her radiation site has healed. She is not using any creams at this time. She is aware she can use vitamin E cream to her radiation site. She is taking anastrozole daily.     ALLERGIES:  is allergic to cefuroxime.  Meds: Current Outpatient Prescriptions  Medication Sig Dispense Refill  . anastrozole (ARIMIDEX) 1 MG tablet Take 1 tablet (1 mg total) by mouth daily. 30 tablet 2  . atenolol (TENORMIN) 50 MG tablet Take 50 mg by mouth daily. In am.      . hydrochlorothiazide (HYDRODIURIL) 25 MG tablet Take 25 mg by mouth daily. In am.    . potassium chloride SA (K-DUR,KLOR-CON) 20 MEQ tablet Take 20-40 mEq by mouth 2 (two) times daily. 40 MEQ in the morning & 20 MEQ at night after supper    . rOPINIRole (REQUIP) 1 MG tablet Take 1 mg by mouth daily at 8 pm.     . sertraline (ZOLOFT) 100 MG tablet Take 100 mg by mouth daily. In am.    . simvastatin (ZOCOR) 20 MG tablet Take 20 mg by mouth daily. In am.    . traZODone (DESYREL) 100 MG tablet Take 100 mg by mouth at bedtime.   2  . oxyCODONE (OXY IR/ROXICODONE) 5 MG immediate release tablet Take 1-2 tablets (5-10 mg total) by mouth every 4 (four) hours as needed for severe pain. (Patient not taking: Reported on 10/28/2016) 20 tablet 0  . traMADol (ULTRAM) 50 MG tablet Take 1-2 tablets (50-100 mg total) by mouth every 4 (four) hours as needed for moderate pain. (Patient not taking: Reported on 10/06/2016) 60 tablet 0   No current facility-administered medications for this encounter.     Physical Findings: The patient is in no acute distress. Patient is alert and oriented.  height is 5' 3.75" (1.619 m) and weight is 167 lb 12.8 oz (76.1 kg). Her temperature is 98.3 F (36.8 C). Her blood pressure is 112/68 and her pulse is 64.  Her oxygen saturation is 95%. .     Skin over breasts is healing well, a little residual dryness bilaterally.   Lab Findings: Lab Results  Component Value Date   WBC 5.6 10/10/2016   HGB 12.0 10/10/2016   HCT 37.4 10/10/2016   MCV 85.4 10/10/2016   PLT 198 10/10/2016    Radiographic Findings: No results found.  Impression/Plan: Doing well.  I encouraged her to continue with yearly mammography and followup with medical oncology. I will see her back on an as-needed basis. I have encouraged her to call if she has any issues or concerns in the future. I wished her the very best. Also recommended her to use vitamin E lotion and powder for dryness of the breast. She has  an appointment with survivorship on 03/02/17.   _____________________________________   Eppie Gibson, MD  This document serves as a record of services personally performed by Eppie Gibson MD. It was created on her behalf by Delton Coombes, a trained medical scribe. The creation of this record is based on the scribe's personal observations and the provider's statements to them. This document has been checked and approved by the attending provider.

## 2017-01-26 DIAGNOSIS — Z23 Encounter for immunization: Secondary | ICD-10-CM | POA: Diagnosis not present

## 2017-02-10 DIAGNOSIS — Z96641 Presence of right artificial hip joint: Secondary | ICD-10-CM | POA: Diagnosis not present

## 2017-02-20 ENCOUNTER — Telehealth: Payer: Self-pay

## 2017-02-20 NOTE — Telephone Encounter (Signed)
Spoke with pt to remind of SCP visit on on 11/26 @ 11 am.  Pt stated she will come to appt.

## 2017-03-02 ENCOUNTER — Other Ambulatory Visit: Payer: Self-pay | Admitting: Hematology

## 2017-03-02 ENCOUNTER — Other Ambulatory Visit: Payer: PPO

## 2017-03-02 ENCOUNTER — Ambulatory Visit (HOSPITAL_BASED_OUTPATIENT_CLINIC_OR_DEPARTMENT_OTHER): Payer: PPO | Admitting: Adult Health

## 2017-03-02 ENCOUNTER — Ambulatory Visit: Payer: PPO | Admitting: Hematology

## 2017-03-02 ENCOUNTER — Encounter: Payer: Self-pay | Admitting: Adult Health

## 2017-03-02 ENCOUNTER — Telehealth: Payer: Self-pay | Admitting: Adult Health

## 2017-03-02 VITALS — BP 100/67 | HR 68 | Temp 98.3°F | Resp 18 | Ht 63.75 in | Wt 171.9 lb

## 2017-03-02 DIAGNOSIS — C50812 Malignant neoplasm of overlapping sites of left female breast: Principal | ICD-10-CM

## 2017-03-02 DIAGNOSIS — Z170 Estrogen receptor positive status [ER+]: Principal | ICD-10-CM

## 2017-03-02 DIAGNOSIS — C50512 Malignant neoplasm of lower-outer quadrant of left female breast: Secondary | ICD-10-CM | POA: Diagnosis not present

## 2017-03-02 DIAGNOSIS — N951 Menopausal and female climacteric states: Secondary | ICD-10-CM | POA: Diagnosis not present

## 2017-03-02 DIAGNOSIS — C50211 Malignant neoplasm of upper-inner quadrant of right female breast: Secondary | ICD-10-CM | POA: Diagnosis not present

## 2017-03-02 DIAGNOSIS — C50811 Malignant neoplasm of overlapping sites of right female breast: Secondary | ICD-10-CM

## 2017-03-02 NOTE — Progress Notes (Signed)
CLINIC:  Survivorship   REASON FOR VISIT:  Routine follow-up post-treatment for a recent history of breast cancer.  BRIEF ONCOLOGIC HISTORY:  Oncology History   Cancer Staging Bilateral breast cancer ([place]) Staging form: Breast, AJCC 8th Edition - Clinical: No stage assigned - Unsigned  Breast cancer of lower-outer quadrant of left female breast ([place]) Staging form: Breast, AJCC 8th Edition - Pathologic stage from 10/15/2016: Stage IA (pT1b, pN0, cM0, G1, ER: Positive, PR: Positive, HER2: Negative) - Signed by Truitt Merle, MD on 10/23/2016  Breast cancer of upper-inner quadrant of right female breast [facility]) Staging form: Breast, AJCC 8th Edition - Pathologic stage from 10/15/2016: Stage IA (pT1a, pN0, cM0, G1, ER: Positive, PR: Positive, HER2: Negative) - Signed by Truitt Merle, MD on 10/23/2016       Bilateral breast cancer ([place])   08/14/2016 Mammogram    IMPRESSION: Further evaluation is suggested for possible distortion in the right breast.  Further evaluation is suggested for possible distortion in the left breast.      08/26/2016 Mammogram    IMPRESSION: 1. There is a suspicious mass in the right breast at 12:30, which is favored to correspond with the distortion identified mammographically.  2.  There is a suspicious mass in the right breast at 1:30.  3. There is an indeterminate elongated mass in the right breast at 12 o'clock.  4. There is an irregular mass in the left breast at 3:30 which is indeterminate. This could be related to the patient's prior surgery, however malignancy cannot be excluded.  5.  No evidence of bilateral lymphadenopathy.      09/09/2016 Pathology Results    Diagnosis 1. Breast, right, needle core biopsy, 12:30 4 cm fn INVASIVE DUCTAL CARCINOMA, GRADE 1 DUCTAL CARCINOMA IN SITU IS PRESENT 2. Breast, right, needle core biopsy, 1:30 3 cm fn FIBROADENOMA 3. Breast, left, needle core biopsy, 3:30 6 cm fn INVASIVE DUCTAL CARCINOMA, GRADE  1      09/09/2016 Initial Diagnosis    Bilateral breast cancer ([place])      09/09/2016 Receptors her2    Left breast cancer ER 100%+, PR 100%+, HER2-, Ki67 10% Right breast cancer ER 100%+, PR 90%+, HER2-, Ki67 2%      10/07/2016 Pathology Results    Diagnosis Breast, right, needle core biopsy, 12:00 o'clock - FIBROADENOMA. - THERE IS NO EVIDENCE OF MALIGNANCY. - SEE COMMENT.      10/15/2016 Pathology Results    Diagnosis 1. Breast, lumpectomy, Right w/ bracketed seed - INVASIVE DUCTAL CARCINOMA, 0.3 CM, MSBR GRADE 1. - MARGINS NOT INVOLVED. - TWO FIBROADENOMAS WITH CALCIFICATIONS. - PREVIOUS BIOPSY SITES AND CLIPS. 2. Breast, lumpectomy, Left w/seed - INVASIVE DUCTAL CARCINOMA, 1 CM, MSBR GRADE 1. - DUCTAL CARCINOMA IN SITU. - MARGINS NOT INVOLVED. - CLOSEST MARGIN INFERIOR AT 0.15 CM. - PREVIOUS BIOPSY SITE AND CLIPS. 3. Lymph node, sentinel, biopsy, Left axillary #1 - ONE BENIGN LYMPH NODE (0/1). 4. Lymph node, sentinel, biopsy, Left axillary #2 - ONE BENIGN LYMPH NODE (0/1). 5. Lymph node, sentinel, biopsy, Left axillary #3 - BENIGN ADIPOSE TISSUE. - NO LYMPH NODE TISSUE OR MALIGNANCY. 6. Lymph node, sentinel, biopsy, Right axillary #1 - ONE BENIGN LYMPH NODE (0/1). 7. Lymph node, sentinel, biopsy, Right axillary 32 - ONE BENIGN LYMPH NODE (0/1). 8. Lymph node, sentinel, biopsy, Right axillary - ONE BENIGN LYMPH NODE (0/1) 9. Lymph node, sentinel, biopsy, Right axillary - ONE BENIGN LYMPH NODE (0/1). 10. Lymph node, sentinel, biopsy, Right axillary - ONE BENIGN  LYMPH NODE (0/1).      10/15/2016 Surgery    Patient recived a bilateral breast lumpectomy performed by Dr. Barry Dienes      11/19/2016 - 12/11/2016 Radiation Therapy    The left breast was treated to 42.56 Gy in 16 fractions of 2.66 Gy. The right breast was treated to 42.56 Gy in 16 fractions of 2.66 Gy.        01/2017 -  Anti-estrogen oral therapy    Anastrozole daily       Breast cancer of lower-outer  quadrant of left female breast ([place])   10/23/2016 Initial Diagnosis    Breast cancer of lower-outer quadrant of left female breast ([place])       Breast cancer of upper-inner quadrant of right female breast ([place])   10/23/2016 Initial Diagnosis    Breast cancer of upper-inner quadrant of right female breast [facility])       INTERVAL HISTORY:  Julie Sosa presents to the [facility] today for our initial meeting to review her survivorship care plan detailing her treatment course for breast cancer, as well as monitoring long-term side effects of that treatment, education regarding health maintenance, screening, and overall wellness and health promotion.     Overall, Julie Sosa reports feeling quite well.  She is taking Anastrozole nightly.  She does have some hot flashes, however is managing these well.      REVIEW OF SYSTEMS:  Review of Systems  Constitutional: Negative for appetite change, chills, fatigue, fever and unexpected weight change.  HENT:   Negative for hearing loss, lump/mass, mouth sores and trouble swallowing.   Eyes: Negative for eye problems and icterus.  Respiratory: Negative for chest tightness and cough.   Cardiovascular: Negative for chest pain, leg swelling and palpitations.  Gastrointestinal: Negative for abdominal distention, abdominal pain, constipation, diarrhea, nausea and vomiting.  Endocrine: Negative for hot flashes.  Genitourinary: Negative for difficulty urinating.   Musculoskeletal: Negative for arthralgias.  Skin: Negative for itching and rash.  Neurological: Negative for dizziness, extremity weakness and headaches.  Hematological: Negative for adenopathy. Does not bruise/bleed easily.  Psychiatric/Behavioral: Negative for depression. The patient is not nervous/anxious.   Breast: Denies any new nodularity, masses, tenderness, nipple changes, or nipple discharge.      ONCOLOGY TREATMENT TEAM:  1. Surgeon:  Dr. Barry Dienes at [facility] 2.  Medical Oncologist: Dr. Burr Medico  3. Radiation Oncologist: Dr. Isidore Moos    PAST MEDICAL/SURGICAL HISTORY:  Past Medical History:  Diagnosis Date  . Anemia   . Anxiety   . Arthritis    osteoarthritis  . Cancer ([place])    melanoma right arm  . Depression   . GERD (gastroesophageal reflux disease)    occasionally uses tums or rolaids  . Headache   . Hypercholesteremia   . Hypertension   . Restless leg syndrome    Past Surgical History:  Procedure Laterality Date  . ABDOMINAL HYSTERECTOMY    . BREAST LUMPECTOMY WITH RADIOACTIVE SEED AND SENTINEL LYMPH NODE BIOPSY Bilateral 10/15/2016   Procedure: RIGHT BREAST LUMPECTOMY WITH BRACKETED RADIOACTIVE SEEDS AND RIGHT SENTINEL LYMPH NODE BIOPSY, LEFT BREAST LUMPECTOMY WITH RADIOACTIVE SEED AND LEFT SENTINEL LYMPH NODE BIOPSY;  Surgeon: [name], MD;  Location: [place];  Service: General;  Laterality: Bilateral;  . BREAST SURGERY     left breast cyst removed  . CATARACT EXTRACTION W/PHACO  03/11/2012   Procedure: CATARACT EXTRACTION PHACO AND INTRAOCULAR LENS PLACEMENT (IOC);  Surgeon: Tonny Branch, MD;  Location: [facility];  Service: Ophthalmology;  Laterality: Right;  CDE:17.71  . CATARACT EXTRACTION W/PHACO  03/29/2012   Procedure: CATARACT EXTRACTION PHACO AND INTRAOCULAR LENS PLACEMENT (IOC);  Surgeon: Tonny Branch, MD;  Location: [facility];  Service: Ophthalmology;  Laterality: Left;  CDE:17.49  . CHOLECYSTECTOMY  10 yrs ago   Arnoldo Morale  . EYE SURGERY Bilateral    cataract extraction  . MUSCLE BIOPSY     removal of cyst-right calf  . TOTAL HIP ARTHROPLASTY Left 12/25/2014   Procedure: TOTAL HIP ARTHROPLASTY;  Surgeon: Dereck Leep, MD;  Location: [facility];  Service: Orthopedics;  Laterality: Left;  . TOTAL HIP ARTHROPLASTY Right 06/23/2016   Procedure: TOTAL HIP ARTHROPLASTY;  Surgeon: Dereck Leep, MD;  Location: [facility];  Service: Orthopedics;  Laterality: Right;  . YAG LASER APPLICATION Right 5/73/2202   Procedure: YAG LASER APPLICATION;   Surgeon: Williams Che, MD;  Location: [facility];  Service: Ophthalmology;  Laterality: Right;  . YAG LASER APPLICATION Left 5/42/7062   Procedure: YAG LASER APPLICATION;  Surgeon: Williams Che, MD;  Location: [facility];  Service: Ophthalmology;  Laterality: Left;     ALLERGIES:  Allergies  Allergen Reactions  . Cefuroxime Rash     CURRENT MEDICATIONS:  Outpatient Encounter Medications as of 03/02/2017  Medication Sig  . anastrozole (ARIMIDEX) 1 MG tablet Take 1 tablet (1 mg total) by mouth daily.  Marland Kitchen atenolol (TENORMIN) 50 MG tablet Take 50 mg by mouth daily. In am.  . hydrochlorothiazide (HYDRODIURIL) 25 MG tablet Take 25 mg by mouth daily. In am.  . potassium chloride SA (K-DUR,KLOR-CON) 20 MEQ tablet Take 20-40 mEq by mouth 2 (two) times daily. 40 MEQ in the morning & 20 MEQ at night after supper  . rOPINIRole (REQUIP) 1 MG tablet Take 1 mg by mouth daily at 8 pm.   . sertraline (ZOLOFT) 100 MG tablet Take 100 mg by mouth daily. In am.  . simvastatin (ZOCOR) 20 MG tablet Take 20 mg by mouth daily. In am.  . traZODone (DESYREL) 100 MG tablet Take 100 mg by mouth at bedtime.   . [DISCONTINUED] oxyCODONE (OXY IR/ROXICODONE) 5 MG immediate release tablet Take 1-2 tablets (5-10 mg total) by mouth every 4 (four) hours as needed for severe pain. (Patient not taking: Reported on 10/28/2016)  . [DISCONTINUED] traMADol (ULTRAM) 50 MG tablet Take 1-2 tablets (50-100 mg total) by mouth every 4 (four) hours as needed for moderate pain. (Patient not taking: Reported on 10/06/2016)   No facility-administered encounter medications on file as of 03/02/2017.      ONCOLOGIC FAMILY HISTORY:  Family History  Problem Relation Age of Onset  . Cancer Brother        lung cancer   . Cancer Other 30       breast cancer   . Cancer Sister 61       breast cancer        SOCIAL HISTORY:  Julie Sosa is married and lives with her husband in [place], [place].  She has 2 children and they  live in [place].  Julie Sosa is currently retired.  She denies any current or history of tobacco, alcohol, or illicit drug use.     PHYSICAL EXAMINATION:  Vital Signs:   Vitals:   03/02/17 1043  BP: 100/67  Pulse: 68  Resp: 18  Temp: 98.3 F (36.8 C)  SpO2: 95%   Filed Weights   03/02/17 1043  Weight: 171 lb 14.4 oz (78 kg)   General: Well-nourished, well-appearing  female in no acute distress. accompanied in clinic by her husband Frederico Hamman today.   HEENT: Head is normocephalic.  Pupils equal and reactive to light. Conjunctivae clear without exudate.  Sclerae anicteric. Oral mucosa is pink, moist.  Oropharynx is pink without lesions or erythema.  Lymph: No cervical, supraclavicular, or infraclavicular lymphadenopathy noted on palpation.  Cardiovascular: Regular rate and rhythm.Marland Kitchen Respiratory: Clear to auscultation bilaterally. Chest expansion symmetric; breathing non-labored.  Breasts: s/p lumpectomies bilaterally, no nodules, masses, changes in either breast.  There is a mod amt of scar tissue present at each lumpectomy site that has not changed or increased. GI: Abdomen soft and round; non-tender, non-distended. Bowel sounds normoactive.  GU: Deferred.  Neuro: No focal deficits. Steady gait.  Psych: Mood and affect normal and appropriate for situation.  Extremities: No edema. MSK: No focal spinal tenderness to palpation.  Full range of motion in bilateral upper extremities Skin: Warm and dry.  LABORATORY DATA:  None for this visit.  DIAGNOSTIC IMAGING:  None for this visit.      ASSESSMENT AND PLAN:  Ms.. Sosa is a pleasant 81 y.o. female with Stage IA bilateral breast invasive ductal carcinoma, ER+/PR+/HER2-, diagnosed in (date), treated with lumpectomy, adjuvant radiation therapy, and anti-estrogen therapy with Anastrozole beginning in 01/2017/.  She presents to the Survivorship Clinic for our initial meeting and routine follow-up post-completion of treatment for breast  cancer.    1. Stage IA bilateral breast cancer:  Julie Sosa is continuing to recover from definitive treatment for breast cancer. She will follow-up with her medical oncologist, Dr. Burr Medico in three months with history and physical exam per surveillance protocol.  She will continue her anti-estrogen therapy with Anastrozole. Thus far, she is tolerating the Anastrozole well, with minimal side effects. She was instructed to make Dr. Burr Medico or myself aware if she begins to experience any worsening side effects of the medication and I could see her back in clinic to help manage those side effects, as needed. Today, a comprehensive survivorship care plan and treatment summary was reviewed with the patient today detailing her breast cancer diagnosis, treatment course, potential late/long-term effects of treatment, appropriate follow-up care with recommendations for the future, and patient education resources.  A copy of this summary, along with a letter will be sent to the patient's primary care provider via mail/fax/In Basket message after today's visit.    2. Bone health:  Given Julie Sosa's age/history of breast cancer and her current treatment regimen including anti-estrogen therapy with Anastrozole, she is at risk for bone demineralization.  Her last DEXA scan was 11/07/2016, which showed a T score of -0.5, which is normal.  In the meantime, she was encouraged to increase her consumption of foods rich in calcium, as well as increase her weight-bearing activities.  She was given education on specific activities to promote bone health.  3. Cancer screening:  Due to Julie Sosa's history and her age, she should receive screening for skin cancers, colon cancer.  The information and recommendations are listed on the patient's comprehensive care plan/treatment summary and were reviewed in detail with the patient.    4. Health maintenance and wellness promotion: Julie Sosa was encouraged to consume 5-7 servings of fruits and  vegetables per day. We reviewed the "Nutrition Rainbow" handout, as well as the handout "Take Control of Your Health and Reduce Your Cancer Risk" from the [place].  She was also encouraged to engage in moderate to vigorous exercise for 30 minutes per day most  days of the week. We discussed the [employer] fitness program, which is designed for cancer survivors to help them become more physically fit after cancer treatments.  She was instructed to limit her alcohol consumption and continue to abstain from tobacco use.     5. Support services/counseling: It is not uncommon for this period of the patient's cancer care trajectory to be one of many emotions and stressors.  We discussed an opportunity for her to participate in the next session of [facility] ("Finding Your New Normal") support group series designed for patients after they have completed treatment.   Julie Sosa was encouraged to take advantage of our many other support services programs, support groups, and/or counseling in coping with her new life as a cancer survivor after completing anti-cancer treatment.  She was offered support today through active listening and expressive supportive counseling.  She was given information regarding our available services and encouraged to contact me with any questions or for help enrolling in any of our support group/programs.    Dispo:   -Return to cancer center for f/u with Dr. Burr Medico in 3 months -Mammogram due in 08/2017 -Bone Density due in 11/2018 -Follow up with Dr. Barry Dienes at [facility] in 9 months -She is welcome to return back to the Survivorship Clinic at any time; no additional follow-up needed at this time.  -Consider referral back to survivorship as a long-term survivor for continued surveillance  A total of (30) minutes of face-to-face time was spent with this patient with greater than 50% of that time in counseling and care-coordination.   Gardenia Phlegm,  NP Survivorship Program [facility] [phone]   Note: PRIMARY CARE PROVIDER [name], [place] [phone]

## 2017-03-02 NOTE — Telephone Encounter (Signed)
Gave patient calendar of upcoming February appointments per 1/ los.

## 2017-03-24 DIAGNOSIS — G4700 Insomnia, unspecified: Secondary | ICD-10-CM | POA: Diagnosis not present

## 2017-03-24 DIAGNOSIS — I10 Essential (primary) hypertension: Secondary | ICD-10-CM | POA: Diagnosis not present

## 2017-03-24 DIAGNOSIS — Z853 Personal history of malignant neoplasm of breast: Secondary | ICD-10-CM | POA: Diagnosis not present

## 2017-03-25 ENCOUNTER — Ambulatory Visit
Admission: RE | Admit: 2017-03-25 | Discharge: 2017-03-25 | Disposition: A | Discharge status: Home / Self Care | Payer: PPO | Source: Ambulatory Visit | Attending: Adult Health | Admitting: Adult Health

## 2017-03-25 ENCOUNTER — Other Ambulatory Visit: Payer: Self-pay | Admitting: Adult Health

## 2017-03-25 DIAGNOSIS — C50812 Malignant neoplasm of overlapping sites of left female breast: Principal | ICD-10-CM

## 2017-03-25 DIAGNOSIS — Z170 Estrogen receptor positive status [ER+]: Principal | ICD-10-CM

## 2017-03-25 DIAGNOSIS — C50811 Malignant neoplasm of overlapping sites of right female breast: Secondary | ICD-10-CM

## 2017-03-30 ENCOUNTER — Other Ambulatory Visit: Payer: Self-pay | Admitting: *Deleted

## 2017-03-30 NOTE — Patient Outreach (Signed)
[facility]) Care Management  03/30/2017  VANNIA POLA [birth year] [id number]  Outreach attempt #1 to patient. No answer. RN CM left HIPAA compliant message along with contact info.    Plan: RN CM will contact patient within one week.   Lake Bells, RN, BSN, MHA/MSL, [place] Telephonic Care Manager Coordinator [facility] Direct Phone: [phone] Cell Phone: [phone] Toll Free: [phone] Fax: [phone]

## 2017-04-01 ENCOUNTER — Ambulatory Visit: Payer: Self-pay | Admitting: *Deleted

## 2017-04-01 ENCOUNTER — Other Ambulatory Visit: Payer: Self-pay | Admitting: *Deleted

## 2017-04-01 NOTE — Patient Outreach (Signed)
[facility]) Care Management  04/01/2017  Julie Sosa 21-Jun-1935 [id number] Transition of Care Referral  Referral Date: 03/30/17 Referral Source: [facility] Urgent TOC Date of Discharge: 03/22/17 Facility: [facility] [place] Discharge Diagnosis: Muscle Weakness (generalized) Insurance: HTA  Outreach attempt # 1 to patient. HIPAA verified with patient. Patient acknowledged being in rehab, diagnosed with weakness. Patient believes radiation and hip replacement caused her muscle weakness. Patient stated, she had a left hip surgery in 2017 and right hip surgery in 2018. She had a total of 16 radiation treatments due to having breast cancer bilaterally. Patient verbalized, she attempting to improve her health. Patient stated, she and her spouse cooked breakfast for her family on Christmas. She plans to join [employer] next year and develop and exercise routine. Patient had a discharge follow-up visit with her primary MD. She reported, her follow-up appointment was positive and her next scheduled visit is in 6 months. Patient's continues to make scheduled visits with the Oncologist. She will have another 3 D imaging in April or May of 2019. Patient reported, her bone scan results were equivalent to a teenager. Patient stated, she is doing well and she doesn't need any home health services. [facility] services and benefits explained to patient. Patient agreed to receive [place], Brochure, and EMMI educational materials.    Plan: RN CM will send MD case closure letter. RN CM will send patient case closure letter. RN CM advised patient to contact RN CM for any needs or concerns. RN CM will notify [facility] Case Management Assistant regarding case closure.  RN CM will send patient EMMI educational materials.    Lake Bells, RN, BSN, MHA/MSL, [place] Telephonic Care Manager Coordinator [facility] Direct Phone: [phone] Toll Free: [phone] Fax:  [phone]

## 2017-04-02 ENCOUNTER — Encounter: Payer: Self-pay | Admitting: *Deleted

## 2017-04-02 NOTE — Telephone Encounter (Signed)
This encounter was created in error - please disregard.

## 2017-04-27 ENCOUNTER — Other Ambulatory Visit: Payer: Self-pay | Admitting: Hematology

## 2017-04-28 ENCOUNTER — Other Ambulatory Visit: Payer: Self-pay | Admitting: *Deleted

## 2017-04-28 MED ORDER — ANASTROZOLE 1 MG PO TABS: ORAL_TABLET | ORAL | 0 refills | Status: DC

## 2017-05-25 ENCOUNTER — Other Ambulatory Visit: Payer: Self-pay | Admitting: Hematology

## 2017-05-26 ENCOUNTER — Other Ambulatory Visit: Payer: Self-pay | Admitting: *Deleted

## 2017-05-26 MED ORDER — ANASTROZOLE 1 MG PO TABS: ORAL_TABLET | ORAL | 0 refills | Status: DC

## 2017-05-28 ENCOUNTER — Telehealth: Payer: Self-pay | Admitting: Hematology

## 2017-05-28 NOTE — Telephone Encounter (Signed)
Called patient regarding 2/25

## 2017-05-31 NOTE — Progress Notes (Signed)
[place]  [phone] [phone]  Clinic Follow up Note   Patient Care Team: [name], MD as PCP - General (Internal Medicine) [name], MD as Consulting Physician (General Surgery) Eppie Gibson, MD as Attending Physician (Radiation Oncology) Truitt Merle, MD as Consulting Physician (Hematology) Gardenia Phlegm, NP as Nurse Practitioner (Hematology and Oncology) 06/01/2017  CHIEF COMPLAINTS:  F/u bilateral breast cancer   Oncology History   Cancer Staging Bilateral breast cancer [facility]) Staging form: Breast, AJCC 8th Edition - Clinical: No stage assigned - Unsigned  Breast cancer of lower-outer quadrant of left female breast [facility]) Staging form: Breast, AJCC 8th Edition - Pathologic stage from 10/15/2016: Stage IA (pT1b, pN0, cM0, G1, ER: Positive, PR: Positive, HER2: Negative) - Signed by Truitt Merle, MD on 10/23/2016  Breast cancer of upper-inner quadrant of right female breast [facility]) Staging form: Breast, AJCC 8th Edition - Pathologic stage from 10/15/2016: Stage IA (pT1a, pN0, cM0, G1, ER: Positive, PR: Positive, HER2: Negative) - Signed by Truitt Merle, MD on 10/23/2016       Bilateral breast cancer ([place])   08/14/2016 Mammogram    IMPRESSION: Further evaluation is suggested for possible distortion in the right breast.  Further evaluation is suggested for possible distortion in the left breast.      08/26/2016 Mammogram    IMPRESSION: 1. There is a suspicious mass in the right breast at 12:30, which is favored to correspond with the distortion identified mammographically.  2.  There is a suspicious mass in the right breast at 1:30.  3. There is an indeterminate elongated mass in the right breast at 12 o'clock.  4. There is an irregular mass in the left breast at 3:30 which is indeterminate. This could be related to the patient's prior surgery, however malignancy cannot be excluded.  5.  No evidence of bilateral  lymphadenopathy.      09/09/2016 Pathology Results    Diagnosis 1. Breast, right, needle core biopsy, 12:30 4 cm fn INVASIVE DUCTAL CARCINOMA, GRADE 1 DUCTAL CARCINOMA IN SITU IS PRESENT 2. Breast, right, needle core biopsy, 1:30 3 cm fn FIBROADENOMA 3. Breast, left, needle core biopsy, 3:30 6 cm fn INVASIVE DUCTAL CARCINOMA, GRADE 1      09/09/2016 Initial Diagnosis    Bilateral breast cancer ([place])      09/09/2016 Receptors her2    Left breast cancer ER 100%+, PR 100%+, HER2-, Ki67 10% Right breast cancer ER 100%+, PR 90%+, HER2-, Ki67 2%      10/07/2016 Pathology Results    Diagnosis Breast, right, needle core biopsy, 12:00 o'clock - FIBROADENOMA. - THERE IS NO EVIDENCE OF MALIGNANCY. - SEE COMMENT.      10/15/2016 Pathology Results    Diagnosis 1. Breast, lumpectomy, Right w/ bracketed seed - INVASIVE DUCTAL CARCINOMA, 0.3 CM, MSBR GRADE 1. - MARGINS NOT INVOLVED. - TWO FIBROADENOMAS WITH CALCIFICATIONS. - PREVIOUS BIOPSY SITES AND CLIPS. 2. Breast, lumpectomy, Left w/seed - INVASIVE DUCTAL CARCINOMA, 1 CM, MSBR GRADE 1. - DUCTAL CARCINOMA IN SITU. - MARGINS NOT INVOLVED. - CLOSEST MARGIN INFERIOR AT 0.15 CM. - PREVIOUS BIOPSY SITE AND CLIPS. 3. Lymph node, sentinel, biopsy, Left axillary #1 - ONE BENIGN LYMPH NODE (0/1). 4. Lymph node, sentinel, biopsy, Left axillary #2 - ONE BENIGN LYMPH NODE (0/1). 5. Lymph node, sentinel, biopsy, Left axillary #3 - BENIGN ADIPOSE TISSUE. - NO LYMPH NODE TISSUE OR MALIGNANCY. 6. Lymph node, sentinel, biopsy, Right axillary #1 - ONE BENIGN LYMPH NODE (0/1). 7. Lymph node,  sentinel, biopsy, Right axillary 32 - ONE BENIGN LYMPH NODE (0/1). 8. Lymph node, sentinel, biopsy, Right axillary - ONE BENIGN LYMPH NODE (0/1) 9. Lymph node, sentinel, biopsy, Right axillary - ONE BENIGN LYMPH NODE (0/1). 10. Lymph node, sentinel, biopsy, Right axillary - ONE BENIGN LYMPH NODE (0/1).      10/15/2016 Surgery    Patient recived a bilateral  breast lumpectomy performed by Dr. Barry Dienes      11/19/2016 - 12/11/2016 Radiation Therapy    The left breast was treated to 42.56 Gy in 16 fractions of 2.66 Gy. The right breast was treated to 42.56 Gy in 16 fractions of 2.66 Gy.        01/2017 -  Anti-estrogen oral therapy    Anastrozole daily       Breast cancer of lower-outer quadrant of left female breast ([place])   10/23/2016 Initial Diagnosis    Breast cancer of lower-outer quadrant of left female breast [facility])       Breast cancer of upper-inner quadrant of right female breast ([place])   10/23/2016 Initial Diagnosis    Breast cancer of upper-inner quadrant of right female breast ([place])        HISTORY OF PRESENTING ILLNESS:  Julie Sosa 82 y.o. female is here because of newly diagnosed bilateral breast cancer. She was referred by her breast surgeon Dr. Barry Dienes.  Patient undergo a screening mammogram on 08/14/2016 with showed abnormality in both breasts. She has had previous abnormal mammograms that were reported benign. Diagnostic imaging showed a 5 mm area at 34 cm from the nipple and a 8 mm area at 133 cm from the nipple. She had normal-appearing lymph nodes in her right breast and on her left, she had a 2 cm area 6cm in the nipple at 3:30 as well as abnormal lymph nodes. She subsequently underwent core needle biopsy of 2 spots on the right. One showed invasive, ductal carcinoma, grade 1 with DCIS and the other was fibroadenoma. Biopsy of the left-sided lesion showed invasive ductal carcinoma grade 1. Prognostic panel was done on the right breast cancer and was ER and PR strongly positive, HER-2 negative and Ki-67 of 2%.  Before surgery, she denied any unexpected weight lost. She did have some weight loss before due to her bilateral hip surgery on 3/19.  She is here today to discuss preventive care accompanied by her husband. She has some pain in her right breast cancer where her lymph nodes were removed. She uses ice packs and tighter  bras to help manage the pain.  CURRENT THERAPY: Anastrozole 2.5 mg daily started on 01/05/17   INTERIM HISTORY:  Julie Sosa is here for follow up. She presents to the clinic today accompanied by her husband. She reports she is doing well overall. She is complaint with Anastrozole and reports no complaints. She states that every once in awhile she'll have some shooting pain in her breasts. She notes she just got a gym membership and wants to start being more active.   On review of systems, pt denies new pain, or any other complaints at this time. Pertinent positives are listed and detailed within the above HPI.   MEDICAL HISTORY:  Past Medical History:  Diagnosis Date  . Anemia   . Anxiety   . Arthritis    osteoarthritis  . Cancer ([place])    melanoma right arm  . Depression   . GERD (gastroesophageal reflux disease)    occasionally uses tums or rolaids  . Headache   .  Hypercholesteremia   . Hypertension   . Restless leg syndrome     SURGICAL HISTORY: Past Surgical History:  Procedure Laterality Date  . ABDOMINAL HYSTERECTOMY    . BREAST LUMPECTOMY WITH RADIOACTIVE SEED AND SENTINEL LYMPH NODE BIOPSY Bilateral 10/15/2016   Procedure: RIGHT BREAST LUMPECTOMY WITH BRACKETED RADIOACTIVE SEEDS AND RIGHT SENTINEL LYMPH NODE BIOPSY, LEFT BREAST LUMPECTOMY WITH RADIOACTIVE SEED AND LEFT SENTINEL LYMPH NODE BIOPSY;  Surgeon: [name], MD;  Location: [place];  Service: General;  Laterality: Bilateral;  . BREAST SURGERY     left breast cyst removed  . CATARACT EXTRACTION W/PHACO  03/11/2012   Procedure: CATARACT EXTRACTION PHACO AND INTRAOCULAR LENS PLACEMENT (IOC);  Surgeon: Tonny Branch, MD;  Location: [facility];  Service: Ophthalmology;  Laterality: Right;  CDE:17.71  . CATARACT EXTRACTION W/PHACO  03/29/2012   Procedure: CATARACT EXTRACTION PHACO AND INTRAOCULAR LENS PLACEMENT (IOC);  Surgeon: Tonny Branch, MD;  Location: [facility];  Service: Ophthalmology;  Laterality: Left;  CDE:17.49  .  CHOLECYSTECTOMY  10 yrs ago   Arnoldo Morale  . EYE SURGERY Bilateral    cataract extraction  . MUSCLE BIOPSY     removal of cyst-right calf  . TOTAL HIP ARTHROPLASTY Left 12/25/2014   Procedure: TOTAL HIP ARTHROPLASTY;  Surgeon: Dereck Leep, MD;  Location: [facility];  Service: Orthopedics;  Laterality: Left;  . TOTAL HIP ARTHROPLASTY Right 06/23/2016   Procedure: TOTAL HIP ARTHROPLASTY;  Surgeon: Dereck Leep, MD;  Location: [facility];  Service: Orthopedics;  Laterality: Right;  . YAG LASER APPLICATION Right 05/26/7586   Procedure: YAG LASER APPLICATION;  Surgeon: Williams Che, MD;  Location: [facility];  Service: Ophthalmology;  Laterality: Right;  . YAG LASER APPLICATION Left 06/29/4980   Procedure: YAG LASER APPLICATION;  Surgeon: Williams Che, MD;  Location: [facility];  Service: Ophthalmology;  Laterality: Left;    SOCIAL HISTORY: Social History   Socioeconomic History  . Marital status: Married    Spouse name: Not on file  . Number of children: Not on file  . Years of education: Not on file  . Highest education level: Not on file  Social Needs  . Financial resource strain: Not on file  . Food insecurity - worry: Not on file  . Food insecurity - inability: Not on file  . Transportation needs - medical: Not on file  . Transportation needs - non-medical: Not on file  Occupational History  . Not on file  Tobacco Use  . Smoking status: Never Smoker  . Smokeless tobacco: Never Used  Substance and Sexual Activity  . Alcohol use: No  . Drug use: No  . Sexual activity: Yes    Birth control/protection: Surgical  Other Topics Concern  . Not on file  Social History Narrative  . Not on file    FAMILY HISTORY: Family History  Problem Relation Age of Onset  . Cancer Brother        lung cancer   . Cancer Other 30       breast cancer   . Cancer Sister 23       breast cancer     ALLERGIES:  is allergic to cefuroxime.  MEDICATIONS:  Current Outpatient Medications  Medication  Sig Dispense Refill  . anastrozole (ARIMIDEX) 1 MG tablet TAKE 1 TABLET BY MOUTH DAILY. START IN MID SEPTEMBER AS DIRECTED 30 tablet 0  . atenolol (TENORMIN) 50 MG tablet Take 50 mg by mouth daily. In am.    . hydrochlorothiazide (HYDRODIURIL) 25  MG tablet Take 25 mg by mouth daily. In am.    . LORazepam (ATIVAN) 0.5 MG tablet Take 0.25 mg by mouth daily as needed.  1  . potassium chloride SA (K-DUR,KLOR-CON) 20 MEQ tablet Take 20-40 mEq by mouth 2 (two) times daily. 40 MEQ in the morning & 20 MEQ at night after supper    . rOPINIRole (REQUIP) 1 MG tablet Take 1 mg by mouth daily at 8 pm.     . sertraline (ZOLOFT) 100 MG tablet Take 100 mg by mouth daily. In am.    . simvastatin (ZOCOR) 20 MG tablet Take 20 mg by mouth daily. In am.    . traZODone (DESYREL) 100 MG tablet Take 100 mg by mouth at bedtime.   2   No current facility-administered medications for this visit.    GYN HISTORY  Menarchal: 14 LMP: 31 Contraceptive: HRT:  GP:2  REVIEW OF SYSTEMS:   Constitutional: Denies fevers, chills or abnormal night sweats Eyes: Denies blurriness of vision, double vision or watery eyes Ears, nose, mouth, throat, and face: Denies mucositis or sore throat Respiratory: Denies cough, dyspnea or wheezes Cardiovascular: Denies palpitation, chest discomfort or lower extremity swelling Gastrointestinal:  Denies nausea, heartburn or change in bowel habits Skin: Denies abnormal skin rashes, mild erythmea Lymphatics: Denies new lymphadenopathy or easy bruising Neurological:Denies numbness, tingling or new weaknesses Behavioral/Psych: Mood is stable, no new changes  Breast: (+) occasional breast pain  All other systems were reviewed with the patient and are negative.  PHYSICAL EXAMINATION:  ECOG PERFORMANCE STATUS: 0 - Asymptomatic  Vitals:   06/01/17 0948  BP: 133/82  Pulse: 61  Resp: 18  Temp: 97.6 F (36.4 C)  SpO2: 97%   Filed Weights   06/01/17 0948  Weight: 176 lb 6.4 oz (80 kg)     GENERAL:alert, no distress and comfortable SKIN: skin color, texture, turgor are normal, no rashes or significant lesions EYES: normal, conjunctiva are pink and non-injected, sclera clear OROPHARYNX:no exudate, no erythema and lips, buccal mucosa, and tongue normal  NECK: supple, thyroid normal size, non-tender, without nodularity LYMPH:  no palpable lymphadenopathy in the cervical, axillary or inguinal LUNGS: clear to auscultation and percussion with normal breathing effort HEART: regular rate & rhythm and no murmurs and no lower extremity edema ABDOMEN:abdomen soft, non-tender and normal bowel sounds Musculoskeletal:no cyanosis of digits and no clubbing  PSYCH: alert & oriented x 3 with fluent speech NEURO: no focal motor/sensory deficits Breasts: Breast inspection showed them to be symmetrical with no nipple discharge.(+) surgical incision on the right is well healed.  Palpation of the breasts and axilla revealed no obvious mass that I could appreciate.   LABORATORY DATA:  I have reviewed the data as listed CBC Latest Ref Rng & Units 06/01/2017 10/10/2016 06/25/2016  WBC 3.9 - 10.3 K/uL 4.7 5.6 7.5  Hemoglobin 12.0 - 15.0 g/dL - 12.0 10.0(L)  Hematocrit 34.8 - 46.6 % 35.9 37.4 28.6(L)  Platelets 145 - 400 K/uL 174 198 148(L)   CMP Latest Ref Rng & Units 06/01/2017 10/10/2016 06/25/2016  Glucose 70 - 140 mg/dL 116 109(H) 115(H)  BUN 7 - 26 mg/dL _0 Creatinine 0.60 - 1.10 mg/dL 0.90 0.92 0.99  Sodium 136 - 145 mmol/L 137 135 133(L)  Potassium 3.5 - 5.1 mmol/L 3.4(L) 3.9 3.3(L)  Chloride 98 - 109 mmol/L 98 102 99(L)  CO2 22 - 29 mmol/L 30(H) 24 27  Calcium 8.4 - 10.4 mg/dL 9.6 9.7 7.7(L)  Total Protein  6.4 - 8.3 g/dL 6.7 7.0 -  Total Bilirubin 0.2 - 1.2 mg/dL 0.8 0.8 -  Alkaline Phos 40 - 150 U/L 71 70 -  AST 5 - 34 U/L 16 22 -  ALT 0 - 55 U/L 16 21 -    PATHOLOGY Diagnosis 10/15/2016 1. Breast, lumpectomy, Right w/ bracketed seed - INVASIVE DUCTAL CARCINOMA, 0.3 CM, MSBR  GRADE 1. - MARGINS NOT INVOLVED. - TWO FIBROADENOMAS WITH CALCIFICATIONS. - PREVIOUS BIOPSY SITES AND CLIPS. 2. Breast, lumpectomy, Left w/seed - INVASIVE DUCTAL CARCINOMA, 1 CM, MSBR GRADE 1. - DUCTAL CARCINOMA IN SITU. - MARGINS NOT INVOLVED. - CLOSEST MARGIN INFERIOR AT 0.15 CM. - PREVIOUS BIOPSY SITE AND CLIPS. 3. Lymph node, sentinel, biopsy, Left axillary #1 - ONE BENIGN LYMPH NODE (0/1). 4. Lymph node, sentinel, biopsy, Left axillary #2 - ONE BENIGN LYMPH NODE (0/1). 5. Lymph node, sentinel, biopsy, Left axillary #3 - BENIGN ADIPOSE TISSUE. - NO LYMPH NODE TISSUE OR MALIGNANCY. 6. Lymph node, sentinel, biopsy, Right axillary #1 - ONE BENIGN LYMPH NODE (0/1). 7. Lymph node, sentinel, biopsy, Right axillary 32 - ONE BENIGN LYMPH NODE (0/1). 8. Lymph node, sentinel, biopsy, Right axillary - ONE BENIGN LYMPH NODE (0/1) 9. Lymph node, sentinel, biopsy, Right axillary - ONE BENIGN LYMPH NODE (0/1). 10. Lymph node, sentinel, biopsy, Right axillary - ONE BENIGN LYMPH NODE (0/1). 1 of 6 FINAL for LEGACIE, DILLINGHAM ([id number]) Microscopic Comment 1. BREAST, INVASIVE TUMOR Procedure: Localized lumpectomy with five sentinel lymph nodes. Laterality: Right breast. Tumor Size: 0.3 cm. Histologic Type: Ductal Grade: I Tubular Differentiation: 1 Nuclear Pleomorphism: 1 Mitotic Count: 1 Ductal Carcinoma in Situ (DCIS): Present, low grade. Extent of Tumor: Skin: N/A Nipple: N/A Skeletal muscle: N/A Margins: Free of tumor. Invasive carcinoma, distance from closest margin: 1 cm from superior margin. DCIS, distance from closest margin: N/A Regional Lymph Nodes: Number of Lymph Nodes Examined: 5 Number of Sentinel Lymph Nodes Examined: 5 Lymph Nodes with Macrometastases: 0 Lymph Nodes with Micrometastases: 0 Lymph Nodes with Isolated Tumor Cells: Breast Prognostic Profile: Case number [id number] Estrogen Receptor: 100%, positive, strong staining. Progesterone Receptor: 90%,  positive, strong staining. Her2: Negative, ratio 1.31. Ki-67: 2% Best tumor block for sendout testing: 1I Pathologic Stage Classification (pTNM, AJCC 8th Edition): Primary Tumor (pT): pT1a Regional Lymph Nodes (pN): pN0 Distant Metastases (pM): pMX Comments: There is a 0.3 cm residual invasive ductal carcinoma located at the biopsy site with the ribbon shaped biopsy clip. There are fibroadenomas with calcifications located at the coil shaped biopsy clip and the wing shaped biopsy clip. 2. BREAST, INVASIVE TUMOR Procedure: Localized lumpectomy and two sentinel lymph nodes. Laterality: Left breast. Tumor Size: 1 cm Histologic Type: Ductal. Grade: I Tubular Differentiation: 1 Nuclear Pleomorphism: 1 Mitotic Count: 1 Ductal Carcinoma in Situ (DCIS): Present, low grade. Extent of Tumor: Skin: N/A Nipple: N/A Skeletal muscle: N/A 2 of 6 FINAL for MCKENZY, SALAZAR ([id number]) Microscopic Comment(continued) Margins: Free of tumor. Invasive carcinoma, distance from closest margin: 0.15 cm from inferior margin. DCIS, distance from closest margin: 0.3 cm from inferior margin. Regional Lymph Nodes: Number of Lymph Nodes Examined: 2 Number of Sentinel Lymph Nodes Examined: 2 Lymph Nodes with Macrometastases: 0 Lymph Nodes with Micrometastases: 0 Lymph Nodes with Isolated Tumor Cells: 0 Breast Prognostic Profile: [id number] Estrogen Receptor: 100%, positive, strong staining. Progesterone Receptor: 100%, positive, strong staining. Her2: Negative, ratio 1.26 Ki-67: 10% Best tumor block for sendout testing: 2A Pathologic Stage Classification (pTNM, AJCC 8th Edition): Primary Tumor (pT): pT1b Regional  Lymph Nodes (pN): pN0  Diagnosis 10/07/2016 Breast, right, needle core biopsy, 12:00 o'clock - FIBROADENOMA. - THERE IS NO EVIDENCE OF MALIGNANCY. - SEE COMMENT.  Diagnosis 09/08/2016 1. Breast, right, needle core biopsy, 12:30 4 cm fn INVASIVE DUCTAL CARCINOMA, GRADE 1 DUCTAL  CARCINOMA IN SITU IS PRESENT 2. Breast, right, needle core biopsy, 1:30 3 cm fn FIBROADENOMA 3. Breast, left, needle core biopsy, 3:30 6 cm fn INVASIVE DUCTAL CARCINOMA, GRADE 1 Microscopic Comment 1. Immunostains for p63, calponin, SMM-1 and ck5/6 are negative, positive for ER supporting the diagnosis of invasive ductal carcinoma.  RADIOGRAPHIC STUDIES: I have personally reviewed the radiological images as listed and agreed with the findings in the report. No results found.  ASSESSMENT & PLAN: 82 y.o. postmenopausal woman, presented with screening discovered bilateral breast cancer.   1. Bilateral Breast Cancer, upper-outer quadrant of left breast, pT1bN0M0, stage IA, and upper inner quadrant of right breast,  pT1aN0M0, stage IA, both invasive ductal carcinoma, grade 1, ER and PR strongly positive, HER-2 negative -I have reviewed her initial image findings, biopsy results, and her final surgical pathology results, discussed with patient in details. -She had bilateral invasive ductal carcinoma, post very early stage, with tumor <1.0cm, low-grade, I think her risk of recurrence is small given the strongly ER/PR positive and HER-2 negative disease, I do not think we need Oncotype. No adjuvant chemotherapy is needed. - Pt completed radiation therapy on 12/11/2016 -She has started and anastrozole, tolerating well, plan for total of 5 years. -She is doing well, exam unremarkable, lab reviewed with her, no clinical concern for recurrence.  We will continue breast cancer surveillance --I discussed spacing out her appointments with me and Dr. Barry Dienes alternatively -Continue breast cancer surveillance. Mammogram in May 2019 -F/u in 4 months, with Dr. Barry Dienes in 8 months  2. Genetics  - Due to family history of breast cancer and her personal history of bilateral breast cancer, I recommend genetic counseling. She is willing to consider, but wants to wait right now before scheduling an appointment. -will  revisit this in the future  3. Bone health  - Pt had a bone density scan on 11/07/16, which was normal  -Discussed the results with pt, which ws normal -We previously discussed the potential side effects of osteopenia and also process from aromatase inhibitor, we'll monitor her bone density closely when she is on aromatase inhibitor.   4. Arthritis  -Follow up with PCP  5. Depression  -continue Zoloft  PLAN -Continue anastrozole  -Lab and F/u in 4 months, with Dr. Barry Dienes in 8 months -every 6 month follow up after that -mammogram at [facility] in 08/2017  No orders of the defined types were placed in this encounter.   All questions were answered. The patient knows to call the clinic with any problems, questions or concerns. I spent 20 minutes counseling the patient face to face. The total time spent in the appointment was 25 minutes and more than 50% was on counseling.  This document serves as a record of services personally performed by Truitt Merle, MD. It was created on her behalf by Theresia Bough, a trained medical scribe. The creation of this record is based on the scribe's personal observations and the provider's statements to them.   I have reviewed the above documentation for accuracy and completeness, and I agree with the above.   Truitt Merle 06/01/17

## 2017-06-01 ENCOUNTER — Encounter: Payer: Self-pay | Admitting: Hematology

## 2017-06-01 ENCOUNTER — Other Ambulatory Visit: Payer: Self-pay | Admitting: Hematology

## 2017-06-01 ENCOUNTER — Inpatient Hospital Stay: Payer: PPO | Attending: Hematology

## 2017-06-01 ENCOUNTER — Inpatient Hospital Stay: Payer: PPO | Admitting: Hematology

## 2017-06-01 VITALS — BP 133/82 | HR 61 | Temp 97.6°F | Resp 18 | Ht 63.75 in | Wt 176.4 lb

## 2017-06-01 DIAGNOSIS — C50812 Malignant neoplasm of overlapping sites of left female breast: Principal | ICD-10-CM

## 2017-06-01 DIAGNOSIS — C50512 Malignant neoplasm of lower-outer quadrant of left female breast: Secondary | ICD-10-CM | POA: Diagnosis not present

## 2017-06-01 DIAGNOSIS — Z170 Estrogen receptor positive status [ER+]: Secondary | ICD-10-CM

## 2017-06-01 DIAGNOSIS — C50811 Malignant neoplasm of overlapping sites of right female breast: Secondary | ICD-10-CM

## 2017-06-01 DIAGNOSIS — C50211 Malignant neoplasm of upper-inner quadrant of right female breast: Secondary | ICD-10-CM | POA: Diagnosis not present

## 2017-06-01 DIAGNOSIS — F329 Major depressive disorder, single episode, unspecified: Secondary | ICD-10-CM

## 2017-06-01 LAB — CBC WITH DIFFERENTIAL (CANCER CENTER ONLY)
BASOS ABS: 0 10*3/uL (ref 0.0–0.1)
BASOS PCT: 1 %
EOS ABS: 0 10*3/uL (ref 0.0–0.5)
EOS PCT: 1 %
HCT: 35.9 % (ref 34.8–46.6)
HEMOGLOBIN: 12.1 g/dL (ref 11.6–15.9)
LYMPHS ABS: 0.5 10*3/uL — AB (ref 0.9–3.3)
Lymphocytes Relative: 10 %
MCH: 29.2 pg (ref 25.1–34.0)
MCHC: 33.6 g/dL (ref 31.5–36.0)
MCV: 86.7 fL (ref 79.5–101.0)
Monocytes Absolute: 0.4 10*3/uL (ref 0.1–0.9)
Monocytes Relative: 8 %
NEUTROS PCT: 80 %
Neutro Abs: 3.7 10*3/uL (ref 1.5–6.5)
PLATELETS: 174 10*3/uL (ref 145–400)
RBC: 4.14 MIL/uL (ref 3.70–5.45)
RDW: 13.5 % (ref 11.2–14.5)
WBC: 4.7 10*3/uL (ref 3.9–10.3)

## 2017-06-01 LAB — CMP (CANCER CENTER ONLY)
ALT: 16 U/L (ref 0–55)
AST: 16 U/L (ref 5–34)
Albumin: 3.6 g/dL (ref 3.5–5.0)
Alkaline Phosphatase: 71 U/L (ref 40–150)
Anion gap: 9 (ref 3–11)
BILIRUBIN TOTAL: 0.8 mg/dL (ref 0.2–1.2)
BUN: 7 mg/dL (ref 7–26)
CALCIUM: 9.6 mg/dL (ref 8.4–10.4)
CHLORIDE: 98 mmol/L (ref 98–109)
CO2: 30 mmol/L — ABNORMAL HIGH (ref 22–29)
CREATININE: 0.9 mg/dL (ref 0.60–1.10)
GFR, EST NON AFRICAN AMERICAN: 59 mL/min — AB (ref >60)
Glucose, Bld: 116 mg/dL (ref 70–140)
Potassium: 3.4 mmol/L — ABNORMAL LOW (ref 3.5–5.1)
Sodium: 137 mmol/L (ref 136–145)
TOTAL PROTEIN: 6.7 g/dL (ref 6.4–8.3)

## 2017-06-05 DIAGNOSIS — H524 Presbyopia: Secondary | ICD-10-CM | POA: Diagnosis not present

## 2017-06-05 DIAGNOSIS — H52221 Regular astigmatism, right eye: Secondary | ICD-10-CM | POA: Diagnosis not present

## 2017-06-05 DIAGNOSIS — Z961 Presence of intraocular lens: Secondary | ICD-10-CM | POA: Diagnosis not present

## 2017-06-22 ENCOUNTER — Other Ambulatory Visit: Payer: Self-pay | Admitting: Hematology

## 2017-08-14 DIAGNOSIS — I10 Essential (primary) hypertension: Secondary | ICD-10-CM | POA: Diagnosis not present

## 2017-08-14 DIAGNOSIS — F329 Major depressive disorder, single episode, unspecified: Secondary | ICD-10-CM | POA: Diagnosis not present

## 2017-08-14 DIAGNOSIS — Z6832 Body mass index (BMI) 32.0-32.9, adult: Secondary | ICD-10-CM | POA: Diagnosis not present

## 2017-09-18 DIAGNOSIS — I8000 Phlebitis and thrombophlebitis of superficial vessels of unspecified lower extremity: Secondary | ICD-10-CM | POA: Diagnosis not present

## 2017-09-22 ENCOUNTER — Other Ambulatory Visit: Payer: Self-pay | Admitting: Hematology

## 2017-09-28 NOTE — Progress Notes (Signed)
[place]  [phone] [phone]  Clinic Follow up Note   Patient Care Team: [name], MD as PCP - General (Internal Medicine) [name], MD as Consulting Physician (General Surgery) Eppie Gibson, MD as Attending Physician (Radiation Oncology) Truitt Merle, MD as Consulting Physician (Hematology) Gardenia Phlegm, NP as Nurse Practitioner (Hematology and Oncology) 09/29/2017  SUMMARY OF ONCOLOGIC HISTORY: Oncology History   Cancer Staging Bilateral breast cancer [facility]) Staging form: Breast, AJCC 8th Edition - Clinical: No stage assigned - Unsigned  Breast cancer of lower-outer quadrant of left female breast [facility]) Staging form: Breast, AJCC 8th Edition - Pathologic stage from 10/15/2016: Stage IA (pT1b, pN0, cM0, G1, ER: Positive, PR: Positive, HER2: Negative) - Signed by Truitt Merle, MD on 10/23/2016  Breast cancer of upper-inner quadrant of right female breast [facility]) Staging form: Breast, AJCC 8th Edition - Pathologic stage from 10/15/2016: Stage IA (pT1a, pN0, cM0, G1, ER: Positive, PR: Positive, HER2: Negative) - Signed by Truitt Merle, MD on 10/23/2016       Bilateral breast cancer ([place])   08/14/2016 Mammogram    IMPRESSION: Further evaluation is suggested for possible distortion in the right breast.  Further evaluation is suggested for possible distortion in the left breast.      08/26/2016 Mammogram    IMPRESSION: 1. There is a suspicious mass in the right breast at 12:30, which is favored to correspond with the distortion identified mammographically.  2.  There is a suspicious mass in the right breast at 1:30.  3. There is an indeterminate elongated mass in the right breast at 12 o'clock.  4. There is an irregular mass in the left breast at 3:30 which is indeterminate. This could be related to the patient's prior surgery, however malignancy cannot be excluded.  5.  No evidence of bilateral lymphadenopathy.      09/09/2016 Pathology Results    Diagnosis 1. Breast, right, needle core biopsy, 12:30 4 cm fn INVASIVE DUCTAL CARCINOMA, GRADE 1 DUCTAL CARCINOMA IN SITU IS PRESENT 2. Breast, right, needle core biopsy, 1:30 3 cm fn FIBROADENOMA 3. Breast, left, needle core biopsy, 3:30 6 cm fn INVASIVE DUCTAL CARCINOMA, GRADE 1      09/09/2016 Initial Diagnosis    Bilateral breast cancer ([place])      09/09/2016 Receptors her2    Left breast cancer ER 100%+, PR 100%+, HER2-, Ki67 10% Right breast cancer ER 100%+, PR 90%+, HER2-, Ki67 2%      10/07/2016 Pathology Results    Diagnosis Breast, right, needle core biopsy, 12:00 o'clock - FIBROADENOMA. - THERE IS NO EVIDENCE OF MALIGNANCY. - SEE COMMENT.      10/15/2016 Pathology Results    Diagnosis 1. Breast, lumpectomy, Right w/ bracketed seed - INVASIVE DUCTAL CARCINOMA, 0.3 CM, MSBR GRADE 1. - MARGINS NOT INVOLVED. - TWO FIBROADENOMAS WITH CALCIFICATIONS. - PREVIOUS BIOPSY SITES AND CLIPS. 2. Breast, lumpectomy, Left w/seed - INVASIVE DUCTAL CARCINOMA, 1 CM, MSBR GRADE 1. - DUCTAL CARCINOMA IN SITU. - MARGINS NOT INVOLVED. - CLOSEST MARGIN INFERIOR AT 0.15 CM. - PREVIOUS BIOPSY SITE AND CLIPS. 3. Lymph node, sentinel, biopsy, Left axillary #1 - ONE BENIGN LYMPH NODE (0/1). 4. Lymph node, sentinel, biopsy, Left axillary #2 - ONE BENIGN LYMPH NODE (0/1). 5. Lymph node, sentinel, biopsy, Left axillary #3 - BENIGN ADIPOSE TISSUE. - NO LYMPH NODE TISSUE OR MALIGNANCY. 6. Lymph node, sentinel, biopsy, Right axillary #1 - ONE BENIGN LYMPH NODE (0/1). 7. Lymph node, sentinel, biopsy, Right axillary 32 -  ONE BENIGN LYMPH NODE (0/1). 8. Lymph node, sentinel, biopsy, Right axillary - ONE BENIGN LYMPH NODE (0/1) 9. Lymph node, sentinel, biopsy, Right axillary - ONE BENIGN LYMPH NODE (0/1). 10. Lymph node, sentinel, biopsy, Right axillary - ONE BENIGN LYMPH NODE (0/1).      10/15/2016 Surgery    Patient recived a bilateral breast lumpectomy  performed by Dr. Barry Dienes      11/19/2016 - 12/11/2016 Radiation Therapy    The left breast was treated to 42.56 Gy in 16 fractions of 2.66 Gy. The right breast was treated to 42.56 Gy in 16 fractions of 2.66 Gy.        01/2017 -  Anti-estrogen oral therapy    Anastrozole daily       Breast cancer of lower-outer quadrant of left female breast ([place])   10/23/2016 Initial Diagnosis    Breast cancer of lower-outer quadrant of left female breast ([place])       Breast cancer of upper-inner quadrant of right female breast ([place])   10/23/2016 Initial Diagnosis    Breast cancer of upper-inner quadrant of right female breast ([place])     CURRENT THERAPY: Anastrozole 2.5 mg daily started on 01/05/17    INTERVAL HISTORY: [name] returns for follow-up as scheduled, she was last seen by Dr. Morey Hummingbird in 05/2017.  She had a recent episode of phlebitis in her left lower leg for which she completed indomethacin yesterday.  She notes her blood pressure is usually normal at PCP.  She has mild fatigue, normal appetite.  She has a [employer] but does not exercise.  Bowel habits fluctuate, mostly constipation for which she takes MiraLAX as needed and drinks prune juice.  She had one episode of small blood with stool while straining for BM, this occurs periodically due to known hemorrhoids.  She has a chronic "dry hacking cough," denies dyspnea, chest pain, fever or chills.  She tolerates anastrozole well, denies bone or joint pain, hot flashes, night sweats, mood swings.  She has occasional breast sensitivity without new lump, nipple discharge, or nipple inversion.  Has not had annual mammogram.   REVIEW OF SYSTEMS:   Constitutional: Denies fevers, chills or abnormal weight loss (+) weight gain (+) mild fatigue  Eyes: Denies blurriness of vision Ears, nose, mouth, throat, and face: Denies mucositis or sore throat Respiratory: Denies dyspnea or wheezes (+) dry cough Cardiovascular: Denies palpitation, chest  discomfort or lower extremity swelling (+) recent left lower leg phlebitis  Gastrointestinal:  Denies nausea, vomiting, diarrhea, heartburn or change in bowel habits (+) constipation (+) hemorrhoids with intermittent small rectal bleeding  Skin: Denies abnormal skin rashes Lymphatics: Denies new lymphadenopathy or easy bruising Neurological:Denies numbness, tingling or new weaknesses Behavioral/Psych: Mood is stable, no new changes  MSK: Denies bone/joint pain  Breast: (+) denies lump/mass, nipple discharge or inversion (+) occasional breast sensitivity  All other systems were reviewed with the patient and are negative.  MEDICAL HISTORY:  Past Medical History:  Diagnosis Date  . Anemia   . Anxiety   . Arthritis    osteoarthritis  . Cancer ([place])    melanoma right arm  . Depression   . GERD (gastroesophageal reflux disease)    occasionally uses tums or rolaids  . Headache   . Hypercholesteremia   . Hypertension   . Restless leg syndrome     SURGICAL HISTORY: Past Surgical History:  Procedure Laterality Date  . ABDOMINAL HYSTERECTOMY    . BREAST LUMPECTOMY WITH RADIOACTIVE SEED AND SENTINEL  LYMPH NODE BIOPSY Bilateral 10/15/2016   Procedure: RIGHT BREAST LUMPECTOMY WITH BRACKETED RADIOACTIVE SEEDS AND RIGHT SENTINEL LYMPH NODE BIOPSY, LEFT BREAST LUMPECTOMY WITH RADIOACTIVE SEED AND LEFT SENTINEL LYMPH NODE BIOPSY;  Surgeon: [name], MD;  Location: [place];  Service: General;  Laterality: Bilateral;  . BREAST SURGERY     left breast cyst removed  . CATARACT EXTRACTION W/PHACO  03/11/2012   Procedure: CATARACT EXTRACTION PHACO AND INTRAOCULAR LENS PLACEMENT (IOC);  Surgeon: Tonny Branch, MD;  Location: [facility];  Service: Ophthalmology;  Laterality: Right;  CDE:17.71  . CATARACT EXTRACTION W/PHACO  03/29/2012   Procedure: CATARACT EXTRACTION PHACO AND INTRAOCULAR LENS PLACEMENT (IOC);  Surgeon: Tonny Branch, MD;  Location: [facility];  Service: Ophthalmology;  Laterality: Left;  CDE:17.49    . CHOLECYSTECTOMY  10 yrs ago   Arnoldo Morale  . EYE SURGERY Bilateral    cataract extraction  . MUSCLE BIOPSY     removal of cyst-right calf  . TOTAL HIP ARTHROPLASTY Left 12/25/2014   Procedure: TOTAL HIP ARTHROPLASTY;  Surgeon: Dereck Leep, MD;  Location: [facility];  Service: Orthopedics;  Laterality: Left;  . TOTAL HIP ARTHROPLASTY Right 06/23/2016   Procedure: TOTAL HIP ARTHROPLASTY;  Surgeon: Dereck Leep, MD;  Location: [facility];  Service: Orthopedics;  Laterality: Right;  . YAG LASER APPLICATION Right 11/14/1749   Procedure: YAG LASER APPLICATION;  Surgeon: Williams Che, MD;  Location: [facility];  Service: Ophthalmology;  Laterality: Right;  . YAG LASER APPLICATION Left 0/25/8527   Procedure: YAG LASER APPLICATION;  Surgeon: Williams Che, MD;  Location: [facility];  Service: Ophthalmology;  Laterality: Left;    I have reviewed the social history and family history with the patient and they are unchanged from previous note.  ALLERGIES:  is allergic to cefuroxime.  MEDICATIONS:  Current Outpatient Medications  Medication Sig Dispense Refill  . anastrozole (ARIMIDEX) 1 MG tablet TAKE 1 TABLET BY MOUTH DAILY 90 tablet 0  . atenolol (TENORMIN) 50 MG tablet Take 50 mg by mouth daily. In am.    . hydrochlorothiazide (HYDRODIURIL) 25 MG tablet Take 25 mg by mouth daily. In am.    . potassium chloride SA (K-DUR,KLOR-CON) 20 MEQ tablet Take 20-40 mEq by mouth 2 (two) times daily. 40 MEQ in the morning & 20 MEQ at night after supper    . rOPINIRole (REQUIP) 1 MG tablet Take 1 mg by mouth daily at 8 pm.     . sertraline (ZOLOFT) 100 MG tablet Take 100 mg by mouth daily. In am.    . simvastatin (ZOCOR) 20 MG tablet Take 20 mg by mouth daily. In am.    . traZODone (DESYREL) 100 MG tablet Take 100 mg by mouth at bedtime.   2   No current facility-administered medications for this visit.     PHYSICAL EXAMINATION: ECOG PERFORMANCE STATUS: 1 - Symptomatic but completely ambulatory  Vitals:    09/29/17 0859  BP: (!) 143/83  Pulse: 61  Resp: 17  Temp: 98.4 F (36.9 C)  SpO2: 97%   Filed Weights   09/29/17 0859  Weight: 182 lb 4.8 oz (82.7 kg)    GENERAL:alert, no distress and comfortable SKIN: no rashes or significant lesions EYES: normal, Conjunctiva are pink and non-injected, sclera clear OROPHARYNX:no thrush or ulcers LYMPH:  no palpable cervical, supraclavicular, or axillary lymphadenopathy  LUNGS: clear to auscultation with normal breathing effort HEART: regular rate & rhythm and no murmurs and no lower extremity edema ABDOMEN:abdomen soft, non-tender and  normal bowel sounds Musculoskeletal:no cyanosis of digits and no clubbing  NEURO: alert & oriented x 3 with fluent speech, no focal motor/sensory deficits BREAST: inspection shows them to be symmetrical without nipple discharge or inversion, s/p bilateral lumpectomy. Bilateral axillary incisions are well healed. No palpable mass in either breast or axilla that I could appreciate.   LABORATORY DATA:  I have reviewed the data as listed CBC Latest Ref Rng & Units 09/29/2017 06/01/2017 10/10/2016  WBC 3.9 - 10.3 K/uL 4.8 4.7 5.6  Hemoglobin 11.6 - 15.9 g/dL 11.7 12.1 12.0  Hematocrit 34.8 - 46.6 % 34.6(L) 35.9 37.4  Platelets 145 - 400 K/uL 175 174 198     CMP Latest Ref Rng & Units 09/29/2017 06/01/2017 10/10/2016  Glucose 70 - 99 mg/dL 115(H) 116 109(H)  BUN 8 - 23 mg/dL '10 7 8  ' Creatinine 0.44 - 1.00 mg/dL 1.01(H) 0.90 0.92  Sodium 135 - 145 mmol/L 134(L) 137 135  Potassium 3.5 - 5.1 mmol/L 4.3 3.4(L) 3.9  Chloride 98 - 111 mmol/L 100 98 102  CO2 22 - 32 mmol/L 26 30(H) 24  Calcium 8.9 - 10.3 mg/dL 9.1 9.6 9.7  Total Protein 6.5 - 8.1 g/dL 6.6 6.7 7.0  Total Bilirubin 0.3 - 1.2 mg/dL 0.7 0.8 0.8  Alkaline Phos 38 - 126 U/L 67 71 70  AST 15 - 41 U/L 12(L) 16 22  ALT 0 - 44 U/L '12 16 21   ' PATHOLOGY Diagnosis 10/15/2016 1. Breast, lumpectomy, Right w/ bracketed seed - INVASIVE DUCTAL CARCINOMA, 0.3 CM, MSBR  GRADE 1. - MARGINS NOT INVOLVED. - TWO FIBROADENOMAS WITH CALCIFICATIONS. - PREVIOUS BIOPSY SITES AND CLIPS. 2. Breast, lumpectomy, Left w/seed - INVASIVE DUCTAL CARCINOMA, 1 CM, MSBR GRADE 1. - DUCTAL CARCINOMA IN SITU. - MARGINS NOT INVOLVED. - CLOSEST MARGIN INFERIOR AT 0.15 CM. - PREVIOUS BIOPSY SITE AND CLIPS. 3. Lymph node, sentinel, biopsy, Left axillary #1 - ONE BENIGN LYMPH NODE (0/1). 4. Lymph node, sentinel, biopsy, Left axillary #2 - ONE BENIGN LYMPH NODE (0/1). 5. Lymph node, sentinel, biopsy, Left axillary #3 - BENIGN ADIPOSE TISSUE. - NO LYMPH NODE TISSUE OR MALIGNANCY. 6. Lymph node, sentinel, biopsy, Right axillary #1 - ONE BENIGN LYMPH NODE (0/1). 7. Lymph node, sentinel, biopsy, Right axillary 32 - ONE BENIGN LYMPH NODE (0/1). 8. Lymph node, sentinel, biopsy, Right axillary - ONE BENIGN LYMPH NODE (0/1) 9. Lymph node, sentinel, biopsy, Right axillary - ONE BENIGN LYMPH NODE (0/1). 10. Lymph node, sentinel, biopsy, Right axillary - ONE BENIGN LYMPH NODE (0/1). 1 of 6 FINAL for Julie Sosa, Julie Sosa ([id number]) Microscopic Comment 1. BREAST, INVASIVE TUMOR Procedure: Localized lumpectomy with five sentinel lymph nodes. Laterality: Right breast. Tumor Size: 0.3 cm. Histologic Type: Ductal Grade: I Tubular Differentiation: 1 Nuclear Pleomorphism: 1 Mitotic Count: 1 Ductal Carcinoma in Situ (DCIS): Present, low grade. Extent of Tumor: Skin: N/A Nipple: N/A Skeletal muscle: N/A Margins: Free of tumor. Invasive carcinoma, distance from closest margin: 1 cm from superior margin. DCIS, distance from closest margin: N/A Regional Lymph Nodes: Number of Lymph Nodes Examined: 5 Number of Sentinel Lymph Nodes Examined: 5 Lymph Nodes with Macrometastases: 0 Lymph Nodes with Micrometastases: 0 Lymph Nodes with Isolated Tumor Cells: Breast Prognostic Profile: Case number [id number] Estrogen Receptor: 100%, positive, strong staining. Progesterone Receptor: 90%,  positive, strong staining. Her2: Negative, ratio 1.31. Ki-67: 2% Best tumor block for sendout testing: 1I Pathologic Stage Classification (pTNM, AJCC 8th Edition): Primary Tumor (pT): pT1a Regional Lymph Nodes (pN): pN0 Distant Metastases (pM):  pMX Comments: There is a 0.3 cm residual invasive ductal carcinoma located at the biopsy site with the ribbon shaped biopsy clip. There are fibroadenomas with calcifications located at the coil shaped biopsy clip and the wing shaped biopsy clip. 2. BREAST, INVASIVE TUMOR Procedure: Localized lumpectomy and two sentinel lymph nodes. Laterality: Left breast. Tumor Size: 1 cm Histologic Type: Ductal. Grade: I Tubular Differentiation: 1 Nuclear Pleomorphism: 1 Mitotic Count: 1 Ductal Carcinoma in Situ (DCIS): Present, low grade. Extent of Tumor: Skin: N/A Nipple: N/A Skeletal muscle: N/A 2 of 6 FINAL for Julie Sosa, Julie Sosa ([id number]) Microscopic Comment(continued) Margins: Free of tumor. Invasive carcinoma, distance from closest margin: 0.15 cm from inferior margin. DCIS, distance from closest margin: 0.3 cm from inferior margin. Regional Lymph Nodes: Number of Lymph Nodes Examined: 2 Number of Sentinel Lymph Nodes Examined: 2 Lymph Nodes with Macrometastases: 0 Lymph Nodes with Micrometastases: 0 Lymph Nodes with Isolated Tumor Cells: 0 Breast Prognostic Profile: [id number] Estrogen Receptor: 100%, positive, strong staining. Progesterone Receptor: 100%, positive, strong staining. Her2: Negative, ratio 1.26 Ki-67: 10% Best tumor block for sendout testing: 2A Pathologic Stage Classification (pTNM, AJCC 8th Edition): Primary Tumor (pT): pT1b Regional Lymph Nodes (pN): pN0  Diagnosis 10/07/2016 Breast, right, needle core biopsy, 12:00 o'clock - FIBROADENOMA. - THERE IS NO EVIDENCE OF MALIGNANCY. - SEE COMMENT.  Diagnosis 09/08/2016 1. Breast, right, needle core biopsy, 12:30 4 cm fn INVASIVE DUCTAL CARCINOMA, GRADE 1 DUCTAL  CARCINOMA IN SITU IS PRESENT 2. Breast, right, needle core biopsy, 1:30 3 cm fn FIBROADENOMA 3. Breast, left, needle core biopsy, 3:30 6 cm fn INVASIVE DUCTAL CARCINOMA, GRADE 1 Microscopic Comment 1. Immunostains for p63, calponin, SMM-1 and ck5/6 are negative, positive for ER supporting the diagnosis of invasive ductal carcinoma.   RADIOGRAPHIC STUDIES: I have personally reviewed the radiological images as listed and agreed with the findings in the report. No results found.   ASSESSMENT & PLAN: 83 y.o. postmenopausal woman, presented with screening discovered bilateral breast cancer.   1. Bilateral Breast Cancer, upper-outer quadrant of left breast, pT1bN0M0, stage IA, and upper inner quadrant of right breast,  pT1aN0M0, stage IA, both invasive ductal carcinoma, grade 1, ER and PR strongly positive, HER-2 negative 2. Genetics  3. Bone health  4. Arthritis  5. Depression   Ms. Hoadley is clinically doing well. She is tolerating anastrozole well overall. Labs are stable. Physical exam is unremarkable. No clinical concern for recurrence. I recommend to continue anastrozole and surveillance. She is due annual mammogram, which I ordered today. I encouraged her to eat well and increase physical activity. She maintains routine f/u with PCP. She thinks she is supposed to see Dr. Barry Dienes later this year, I encouraged her to call their office to make f/u visit, she agrees. Plan to see her back in 6 months.   PLAN -Labs reviewed -Continue breast cancer surveillance and anastrozole -Due annual bilat mammogram, ordered today -Lab, f/u with Dr. Burr Medico in 6 months -Patient to call Dr. Marlowe Aschoff office for f/u  Orders Placed This Encounter  Procedures  . MM DIAG BREAST TOMO BILATERAL    Standing Status:   Future    Standing Expiration Date:   09/30/2018    Order Specific Question:   Reason for Exam (SYMPTOM  OR DIAGNOSIS REQUIRED)    Answer:   h/o bilateral breast cancer 2018, first annual mammo  since diagnosis; s/p lumpectomy, radiation, on AI    Order Specific Question:   Preferred imaging location?    Answer:  [facility]   All questions were answered. The patient knows to call the clinic with any problems, questions or concerns. No barriers to learning was detected.     Alla Feeling, NP 09/29/17

## 2017-09-29 ENCOUNTER — Telehealth: Payer: Self-pay | Admitting: Hematology

## 2017-09-29 ENCOUNTER — Inpatient Hospital Stay: Payer: PPO | Attending: Nurse Practitioner

## 2017-09-29 ENCOUNTER — Encounter: Payer: Self-pay | Admitting: Nurse Practitioner

## 2017-09-29 ENCOUNTER — Inpatient Hospital Stay (HOSPITAL_BASED_OUTPATIENT_CLINIC_OR_DEPARTMENT_OTHER): Payer: PPO | Admitting: Nurse Practitioner

## 2017-09-29 VITALS — BP 143/83 | HR 61 | Temp 98.4°F | Resp 17 | Ht 63.75 in | Wt 182.3 lb

## 2017-09-29 DIAGNOSIS — Z170 Estrogen receptor positive status [ER+]: Secondary | ICD-10-CM | POA: Diagnosis not present

## 2017-09-29 DIAGNOSIS — C50812 Malignant neoplasm of overlapping sites of left female breast: Secondary | ICD-10-CM

## 2017-09-29 DIAGNOSIS — C50211 Malignant neoplasm of upper-inner quadrant of right female breast: Secondary | ICD-10-CM

## 2017-09-29 DIAGNOSIS — C50412 Malignant neoplasm of upper-outer quadrant of left female breast: Secondary | ICD-10-CM | POA: Diagnosis not present

## 2017-09-29 DIAGNOSIS — C50811 Malignant neoplasm of overlapping sites of right female breast: Secondary | ICD-10-CM

## 2017-09-29 DIAGNOSIS — Z79899 Other long term (current) drug therapy: Secondary | ICD-10-CM | POA: Insufficient documentation

## 2017-09-29 DIAGNOSIS — Z79811 Long term (current) use of aromatase inhibitors: Secondary | ICD-10-CM | POA: Diagnosis not present

## 2017-09-29 DIAGNOSIS — R05 Cough: Secondary | ICD-10-CM | POA: Diagnosis not present

## 2017-09-29 DIAGNOSIS — K649 Unspecified hemorrhoids: Secondary | ICD-10-CM

## 2017-09-29 DIAGNOSIS — K5900 Constipation, unspecified: Secondary | ICD-10-CM | POA: Insufficient documentation

## 2017-09-29 DIAGNOSIS — C50512 Malignant neoplasm of lower-outer quadrant of left female breast: Secondary | ICD-10-CM

## 2017-09-29 LAB — CBC WITH DIFFERENTIAL (CANCER CENTER ONLY)
BASOS PCT: 1 %
Basophils Absolute: 0.1 10*3/uL (ref 0.0–0.1)
EOS ABS: 0.1 10*3/uL (ref 0.0–0.5)
Eosinophils Relative: 2 %
HEMATOCRIT: 34.6 % — AB (ref 34.8–46.6)
Hemoglobin: 11.7 g/dL (ref 11.6–15.9)
Lymphocytes Relative: 13 %
Lymphs Abs: 0.6 10*3/uL — ABNORMAL LOW (ref 0.9–3.3)
MCH: 29.8 pg (ref 25.1–34.0)
MCHC: 33.7 g/dL (ref 31.5–36.0)
MCV: 88.3 fL (ref 79.5–101.0)
MONOS PCT: 10 %
Monocytes Absolute: 0.5 10*3/uL (ref 0.1–0.9)
NEUTROS ABS: 3.6 10*3/uL (ref 1.5–6.5)
NEUTROS PCT: 74 %
Platelet Count: 175 10*3/uL (ref 145–400)
RBC: 3.92 MIL/uL (ref 3.70–5.45)
RDW: 12.9 % (ref 11.2–14.5)
WBC: 4.8 10*3/uL (ref 3.9–10.3)

## 2017-09-29 LAB — CMP (CANCER CENTER ONLY)
ALK PHOS: 67 U/L (ref 38–126)
ALT: 12 U/L (ref 0–44)
ANION GAP: 8 (ref 5–15)
AST: 12 U/L — ABNORMAL LOW (ref 15–41)
Albumin: 3.9 g/dL (ref 3.5–5.0)
BUN: 10 mg/dL (ref 8–23)
CALCIUM: 9.1 mg/dL (ref 8.9–10.3)
CHLORIDE: 100 mmol/L (ref 98–111)
CO2: 26 mmol/L (ref 22–32)
Creatinine: 1.01 mg/dL — ABNORMAL HIGH (ref 0.44–1.00)
GFR, EST AFRICAN AMERICAN: 60 mL/min — AB (ref >60)
GFR, EST NON AFRICAN AMERICAN: 52 mL/min — AB (ref >60)
Glucose, Bld: 115 mg/dL — ABNORMAL HIGH (ref 70–99)
Potassium: 4.3 mmol/L (ref 3.5–5.1)
SODIUM: 134 mmol/L — AB (ref 135–145)
Total Bilirubin: 0.7 mg/dL (ref 0.3–1.2)
Total Protein: 6.6 g/dL (ref 6.5–8.1)

## 2017-09-29 NOTE — Telephone Encounter (Signed)
Appointments schedule mailed to patient per 6/25 los

## 2017-10-06 ENCOUNTER — Ambulatory Visit
Admission: RE | Admit: 2017-10-06 | Discharge: 2017-10-06 | Disposition: A | Discharge status: Home / Self Care | Payer: PPO | Source: Ambulatory Visit | Attending: Nurse Practitioner | Admitting: Nurse Practitioner

## 2017-10-06 DIAGNOSIS — C50211 Malignant neoplasm of upper-inner quadrant of right female breast: Secondary | ICD-10-CM

## 2017-10-06 DIAGNOSIS — R922 Inconclusive mammogram: Secondary | ICD-10-CM | POA: Diagnosis not present

## 2017-10-06 DIAGNOSIS — C50512 Malignant neoplasm of lower-outer quadrant of left female breast: Secondary | ICD-10-CM

## 2017-10-06 DIAGNOSIS — Z170 Estrogen receptor positive status [ER+]: Principal | ICD-10-CM

## 2017-10-06 HISTORY — DX: Malignant neoplasm of unspecified site of unspecified female breast: C50.919

## 2017-10-06 HISTORY — DX: Personal history of irradiation: Z92.3

## 2017-11-27 DIAGNOSIS — K5792 Diverticulitis of intestine, part unspecified, without perforation or abscess without bleeding: Secondary | ICD-10-CM | POA: Diagnosis not present

## 2017-11-27 DIAGNOSIS — R079 Chest pain, unspecified: Secondary | ICD-10-CM | POA: Diagnosis not present

## 2017-12-08 DIAGNOSIS — K649 Unspecified hemorrhoids: Secondary | ICD-10-CM | POA: Diagnosis not present

## 2017-12-08 DIAGNOSIS — N390 Urinary tract infection, site not specified: Secondary | ICD-10-CM | POA: Diagnosis not present

## 2017-12-19 ENCOUNTER — Other Ambulatory Visit: Payer: Self-pay | Admitting: Hematology

## 2017-12-23 IMAGING — DX DG HIP (WITH OR WITHOUT PELVIS) 1V PORT*R*
2 series · 2 of 2 positions shown · non-contrast
Comparison: 12/25/2014

CLINICAL DATA: Postop total hip replacement.

EXAM:
DG HIP (WITH OR WITHOUT PELVIS) 1V PORT RIGHT

[pelvis ap]
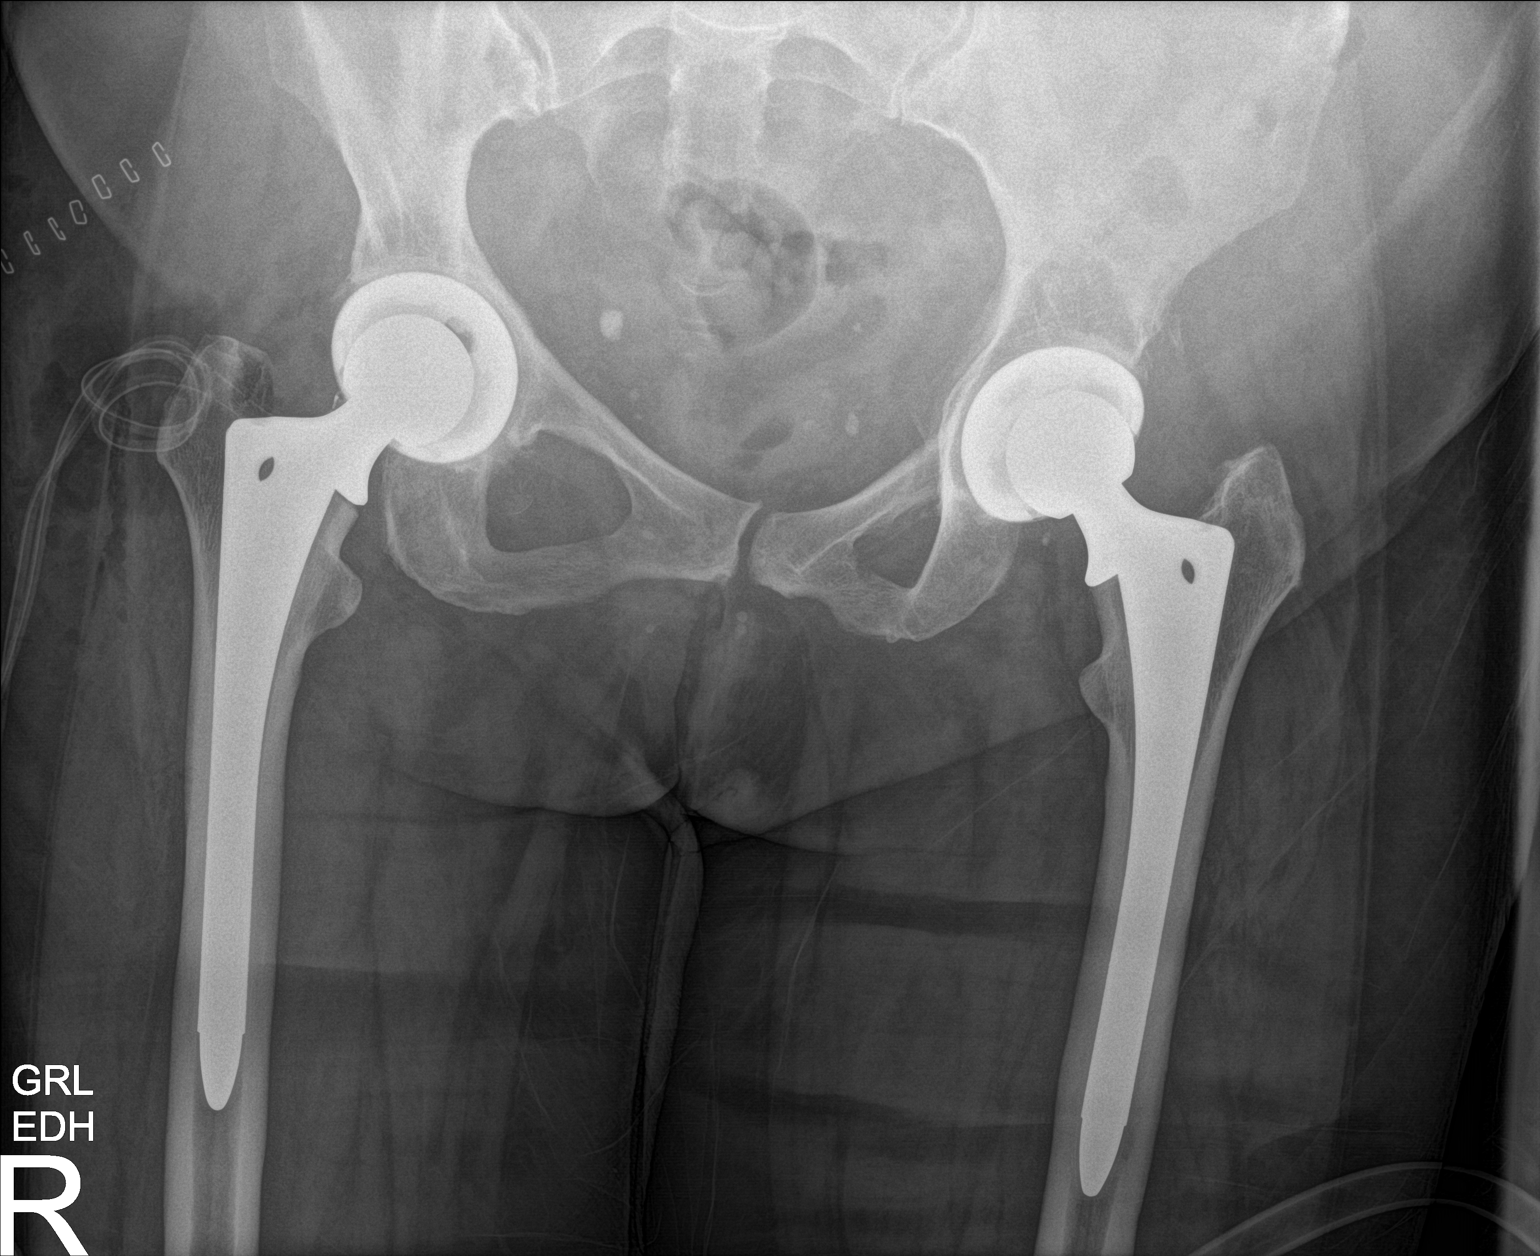

[pelvis lat]
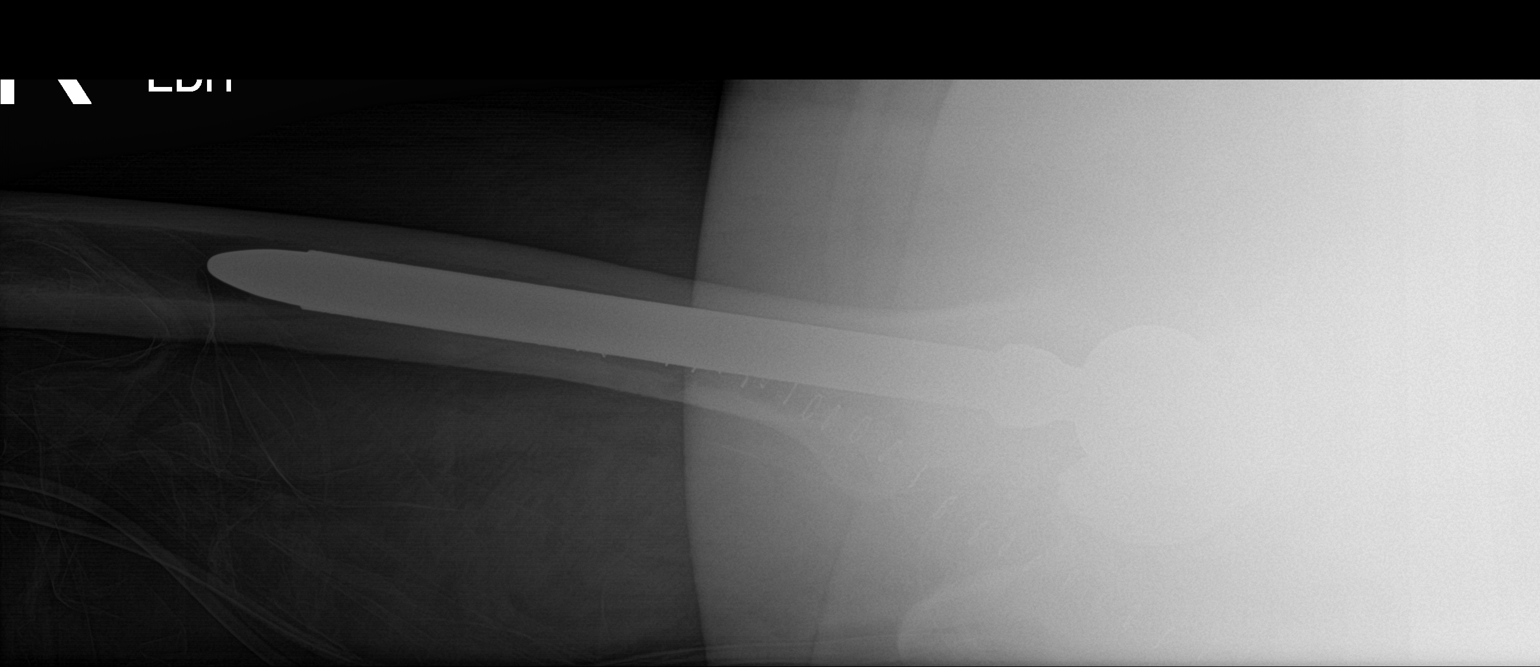

[2 of 2 positions shown; findings below may reference images not displayed]

FINDINGS: Interval right hip replacement. Normal alignment. No hardware or
bony complicating feature. Soft tissue drain in place. Remote
changes of left hip replacement.
IMPRESSION: Interval right hip replacement without complicating feature.

## 2017-12-29 DIAGNOSIS — E785 Hyperlipidemia, unspecified: Secondary | ICD-10-CM | POA: Diagnosis not present

## 2017-12-29 DIAGNOSIS — I10 Essential (primary) hypertension: Secondary | ICD-10-CM | POA: Diagnosis not present

## 2017-12-29 DIAGNOSIS — Z79899 Other long term (current) drug therapy: Secondary | ICD-10-CM | POA: Diagnosis not present

## 2017-12-29 DIAGNOSIS — E119 Type 2 diabetes mellitus without complications: Secondary | ICD-10-CM | POA: Diagnosis not present

## 2018-01-05 DIAGNOSIS — F3340 Major depressive disorder, recurrent, in remission, unspecified: Secondary | ICD-10-CM | POA: Diagnosis not present

## 2018-01-05 DIAGNOSIS — I10 Essential (primary) hypertension: Secondary | ICD-10-CM | POA: Diagnosis not present

## 2018-01-05 DIAGNOSIS — Z23 Encounter for immunization: Secondary | ICD-10-CM | POA: Diagnosis not present

## 2018-01-05 DIAGNOSIS — Z0001 Encounter for general adult medical examination with abnormal findings: Secondary | ICD-10-CM | POA: Diagnosis not present

## 2018-01-05 DIAGNOSIS — E785 Hyperlipidemia, unspecified: Secondary | ICD-10-CM | POA: Diagnosis not present

## 2018-01-05 DIAGNOSIS — Z853 Personal history of malignant neoplasm of breast: Secondary | ICD-10-CM | POA: Diagnosis not present

## 2018-01-05 DIAGNOSIS — E1159 Type 2 diabetes mellitus with other circulatory complications: Secondary | ICD-10-CM | POA: Diagnosis not present

## 2018-01-05 DIAGNOSIS — Z6832 Body mass index (BMI) 32.0-32.9, adult: Secondary | ICD-10-CM | POA: Diagnosis not present

## 2018-01-11 ENCOUNTER — Telehealth: Payer: Self-pay | Admitting: Hematology

## 2018-01-11 DIAGNOSIS — C50512 Malignant neoplasm of lower-outer quadrant of left female breast: Secondary | ICD-10-CM | POA: Diagnosis not present

## 2018-01-11 DIAGNOSIS — C50211 Malignant neoplasm of upper-inner quadrant of right female breast: Secondary | ICD-10-CM | POA: Diagnosis not present

## 2018-01-11 NOTE — Telephone Encounter (Signed)
Called regarding 10/7 sch msg

## 2018-01-25 ENCOUNTER — Emergency Department (HOSPITAL_COMMUNITY)
Admission: EM | Admit: 2018-01-25 | Discharge: 2018-01-25 | Disposition: A | Discharge status: Home / Self Care | Payer: PPO | Attending: Emergency Medicine | Admitting: Emergency Medicine

## 2018-01-25 ENCOUNTER — Encounter (HOSPITAL_COMMUNITY): Payer: Self-pay | Admitting: Emergency Medicine

## 2018-01-25 ENCOUNTER — Emergency Department (HOSPITAL_COMMUNITY): Payer: PPO

## 2018-01-25 DIAGNOSIS — R1013 Epigastric pain: Secondary | ICD-10-CM | POA: Diagnosis not present

## 2018-01-25 DIAGNOSIS — I10 Essential (primary) hypertension: Secondary | ICD-10-CM | POA: Diagnosis not present

## 2018-01-25 DIAGNOSIS — R112 Nausea with vomiting, unspecified: Secondary | ICD-10-CM | POA: Diagnosis not present

## 2018-01-25 DIAGNOSIS — K5730 Diverticulosis of large intestine without perforation or abscess without bleeding: Secondary | ICD-10-CM | POA: Diagnosis not present

## 2018-01-25 DIAGNOSIS — Z79899 Other long term (current) drug therapy: Secondary | ICD-10-CM | POA: Diagnosis not present

## 2018-01-25 DIAGNOSIS — R0789 Other chest pain: Secondary | ICD-10-CM | POA: Diagnosis not present

## 2018-01-25 LAB — I-STAT TROPONIN, ED: TROPONIN I, POC: 0 ng/mL (ref 0.00–0.08)

## 2018-01-25 LAB — COMPREHENSIVE METABOLIC PANEL
ALBUMIN: 4.1 g/dL (ref 3.5–5.0)
ALT: 24 U/L (ref 0–44)
ANION GAP: 11 (ref 5–15)
AST: 20 U/L (ref 15–41)
Alkaline Phosphatase: 65 U/L (ref 38–126)
BILIRUBIN TOTAL: 1 mg/dL (ref 0.3–1.2)
BUN: 5 mg/dL — ABNORMAL LOW (ref 8–23)
CALCIUM: 9.3 mg/dL (ref 8.9–10.3)
CO2: 27 mmol/L (ref 22–32)
Chloride: 98 mmol/L (ref 98–111)
Creatinine, Ser: 0.87 mg/dL (ref 0.44–1.00)
GFR calc non Af Amer: >60 mL/min (ref >60)
GLUCOSE: 110 mg/dL — AB (ref 70–99)
Potassium: 3.4 mmol/L — ABNORMAL LOW (ref 3.5–5.1)
Sodium: 136 mmol/L (ref 135–145)
TOTAL PROTEIN: 7.1 g/dL (ref 6.5–8.1)

## 2018-01-25 LAB — URINALYSIS, ROUTINE W REFLEX MICROSCOPIC
Bilirubin Urine: NEGATIVE
Glucose, UA: NEGATIVE mg/dL
Hgb urine dipstick: NEGATIVE
Ketones, ur: NEGATIVE mg/dL
LEUKOCYTES UA: NEGATIVE
NITRITE: NEGATIVE
PROTEIN: NEGATIVE mg/dL
SPECIFIC GRAVITY, URINE: 1.028 (ref 1.005–1.030)
pH: 7 (ref 5.0–8.0)

## 2018-01-25 LAB — CBC
HCT: 36.8 % (ref 36.0–46.0)
HEMOGLOBIN: 12.1 g/dL (ref 12.0–15.0)
MCH: 29.5 pg (ref 26.0–34.0)
MCHC: 32.9 g/dL (ref 30.0–36.0)
MCV: 89.8 fL (ref 80.0–100.0)
NRBC: 0 % (ref 0.0–0.2)
PLATELETS: 199 10*3/uL (ref 150–400)
RBC: 4.1 MIL/uL (ref 3.87–5.11)
RDW: 12.3 % (ref 11.5–15.5)
WBC: 6.6 10*3/uL (ref 4.0–10.5)

## 2018-01-25 LAB — LIPASE, BLOOD: Lipase: 35 U/L (ref 11–51)

## 2018-01-25 MED ORDER — METOCLOPRAMIDE HCL 5 MG/ML IJ SOLN
10.0000 mg | Freq: Once | INTRAMUSCULAR | Status: AC
  Administered 2018-01-25: 10 mg via INTRAVENOUS
  Filled 2018-01-25: qty 2

## 2018-01-25 MED ORDER — RANITIDINE HCL 150 MG/10ML PO SYRP: 150.0000 mg | ORAL_SOLUTION | Freq: Once | ORAL | Status: DC

## 2018-01-25 MED ORDER — FAMOTIDINE 20 MG PO TABS: 20.0000 mg | ORAL_TABLET | Freq: Two times a day (BID) | ORAL | 0 refills | Status: DC

## 2018-01-25 MED ORDER — LIDOCAINE VISCOUS HCL 2 % MT SOLN: 15.0000 mL | OROMUCOSAL | 0 refills | Status: DC | PRN

## 2018-01-25 MED ORDER — IOPAMIDOL (ISOVUE-300) INJECTION 61%
100.0000 mL | Freq: Once | INTRAVENOUS | Status: AC | PRN
  Administered 2018-01-25: 100 mL via INTRAVENOUS

## 2018-01-25 MED ORDER — ONDANSETRON 4 MG PO TBDP
4.0000 mg | ORAL_TABLET | Freq: Once | ORAL | Status: AC
  Administered 2018-01-25: 4 mg via ORAL
  Filled 2018-01-25: qty 1

## 2018-01-25 MED ORDER — METOCLOPRAMIDE HCL 10 MG PO TABS: 10.0000 mg | ORAL_TABLET | Freq: Four times a day (QID) | ORAL | 0 refills | Status: DC | PRN

## 2018-01-25 MED ORDER — ALUM & MAG HYDROXIDE-SIMETH 200-200-20 MG/5ML PO SUSP
30.0000 mL | Freq: Once | ORAL | Status: AC
  Administered 2018-01-25: 30 mL via ORAL
  Filled 2018-01-25: qty 30

## 2018-01-25 MED ORDER — FAMOTIDINE 20 MG PO TABS
20.0000 mg | ORAL_TABLET | Freq: Once | ORAL | Status: AC
  Administered 2018-01-25: 20 mg via ORAL
  Filled 2018-01-25: qty 1

## 2018-01-25 NOTE — ED Triage Notes (Signed)
Pt states last night she began feeling as if she was having heartburn and then woke up with same this morning.  On the way to [facility] began having vomiting.

## 2018-01-25 NOTE — ED Notes (Signed)
Patient given discharge instruction, verbalized understand. IV removed, band aid applied. Patient ambulatory out of the department.  

## 2018-01-25 NOTE — ED Notes (Signed)
EDP at the bedside, Pt complains of acid burning in chest and took tums 2 times this morning, vomiting later.

## 2018-01-25 NOTE — ED Notes (Signed)
EDP gave pt crackers, pt stated vomiting. States feels like something is stuck.

## 2018-01-25 NOTE — ED Provider Notes (Signed)
Emergency Department Provider Note   I have reviewed the triage vital signs and the nursing notes.   HISTORY  Chief Complaint Emesis   HPI Julie Sosa is a 82 y.o. female with a history of breast cancer, hyperlipidemia, hypertension, hiatal hernia and multiple surgeries who presents the emergency department today with epigastric discomfort and chest pain associated with vomiting.  Patient states that started last night around early evening she had some chest discomfort that felt like indigestion extent was stuck in her throat so she took some Tums which seemed to help with a little bit she is able to sleep through the night she woke up this morning still had the symptoms and then had some nausea associated with that she was on the way to [facility] for her husband's doctor's appointment when she got significantly more chest pressure felt like tightness in her chest did not radiate anywhere but she felt very weak lightheaded and then vomited a couple times.  Patient states that this slowly went away on its own she went home and cleaned everything up and she took some Tums and things seem to be improving.  She also relates that she is never had this before.  She was diagnosed with diverticulitis about a month ago and had significant issues with that.  Not any recent fevers.  She is on MiraLAX multiple days and has loose stools because of that.  No sick contacts.  No alcohol or tobacco or other drugs.  No history of cardiac disease. No other associated or modifying symptoms.    Past Medical History:  Diagnosis Date  . Anemia   . Anxiety   . Arthritis    osteoarthritis  . Breast cancer ([place])    bilat 2018  . Cancer ([place])    melanoma right arm  . Depression   . GERD (gastroesophageal reflux disease)    occasionally uses tums or rolaids  . Headache   . Hypercholesteremia   . Hypertension   . Personal history of radiation therapy   . Restless leg syndrome     Patient Active  Problem List   Diagnosis Date Noted  . Bilateral breast cancer ([place]) 10/23/2016  . Breast cancer of lower-outer quadrant of left female breast ([place]) 10/23/2016  . Breast cancer of upper-inner quadrant of right female breast ([place]) 10/23/2016  . Degenerative joint disease (DJD) of hip 12/25/2014  . S/P total hip arthroplasty 12/25/2014    Past Surgical History:  Procedure Laterality Date  . ABDOMINAL HYSTERECTOMY    . BREAST BIOPSY    . BREAST CYST EXCISION    . BREAST LUMPECTOMY Bilateral    2018  . BREAST LUMPECTOMY WITH RADIOACTIVE SEED AND SENTINEL LYMPH NODE BIOPSY Bilateral 10/15/2016   Procedure: RIGHT BREAST LUMPECTOMY WITH BRACKETED RADIOACTIVE SEEDS AND RIGHT SENTINEL LYMPH NODE BIOPSY, LEFT BREAST LUMPECTOMY WITH RADIOACTIVE SEED AND LEFT SENTINEL LYMPH NODE BIOPSY;  Surgeon: [name], MD;  Location: [facility];  Service: General;  Laterality: Bilateral;  . BREAST SURGERY     left breast cyst removed  . CATARACT EXTRACTION W/PHACO  03/11/2012   Procedure: CATARACT EXTRACTION PHACO AND INTRAOCULAR LENS PLACEMENT (IOC);  Surgeon: Tonny Branch, MD;  Location: [facility];  Service: Ophthalmology;  Laterality: Right;  CDE:17.71  . CATARACT EXTRACTION W/PHACO  03/29/2012   Procedure: CATARACT EXTRACTION PHACO AND INTRAOCULAR LENS PLACEMENT (IOC);  Surgeon: Tonny Branch, MD;  Location: [facility];  Service: Ophthalmology;  Laterality: Left;  CDE:17.49  . CHOLECYSTECTOMY  10 yrs  ago   Arnoldo Morale  . EYE SURGERY Bilateral    cataract extraction  . MUSCLE BIOPSY     removal of cyst-right calf  . TOTAL HIP ARTHROPLASTY Left 12/25/2014   Procedure: TOTAL HIP ARTHROPLASTY;  Surgeon: Dereck Leep, MD;  Location: [facility];  Service: Orthopedics;  Laterality: Left;  . TOTAL HIP ARTHROPLASTY Right 06/23/2016   Procedure: TOTAL HIP ARTHROPLASTY;  Surgeon: Dereck Leep, MD;  Location: [facility];  Service: Orthopedics;  Laterality: Right;  . YAG LASER APPLICATION Right 06/05/6008   Procedure: YAG LASER  APPLICATION;  Surgeon: Williams Che, MD;  Location: [facility];  Service: Ophthalmology;  Laterality: Right;  . YAG LASER APPLICATION Left 9/32/3557   Procedure: YAG LASER APPLICATION;  Surgeon: Williams Che, MD;  Location: [facility];  Service: Ophthalmology;  Laterality: Left;    Current Outpatient Rx  . Order #: [id number] Class: Normal  . Order #: [id number] Class: Historical Med  . Order #: [id number] Class: Print  . Order #: [id number] Class: Historical Med  . Order #: [id number] Class: Print  . Order #: [id number] Class: Print  . Order #: [id number] Class: Historical Med  . Order #: [id number] Class: Historical Med  . Order #: [id number] Class: Historical Med  . Order #: [id number] Class: Historical Med  . Order #: [id number] Class: Historical Med    Allergies Cefuroxime  Family History  Problem Relation Age of Onset  . Cancer Brother        lung cancer   . Cancer Other 30       breast cancer   . Cancer Sister 97       breast cancer   . Breast cancer Sister     Social History Social History   Tobacco Use  . Smoking status: Never Smoker  . Smokeless tobacco: Never Used  Substance Use Topics  . Alcohol use: No  . Drug use: No    Review of Systems  All other systems negative except as documented in the HPI. All pertinent positives and negatives as reviewed in the HPI. ____________________________________________   PHYSICAL EXAM:  VITAL SIGNS: ED Triage Vitals  Enc Vitals Group     BP 01/25/18 1233 (!) 147/76     Pulse Rate 01/25/18 1233 64     Resp 01/25/18 1233 16     Temp 01/25/18 1233 98.2 F (36.8 C)     Temp Source 01/25/18 1233 Oral     SpO2 01/25/18 1233 98 %     Weight 01/25/18 1233 175 lb (79.4 kg)     Height 01/25/18 1233 5\' 1"  (1.549 m)    Constitutional: Alert and oriented. Well appearing and in no acute distress. Eyes: Conjunctivae are normal. PERRL. EOMI. Head: Atraumatic. Nose: No congestion/rhinnorhea. Mouth/Throat: Mucous membranes are moist.   Oropharynx non-erythematous. Neck: No stridor.  No meningeal signs.   Cardiovascular: Normal rate, regular rhythm. Good peripheral circulation. Grossly normal heart sounds.   Respiratory: Normal respiratory effort.  No retractions. Lungs CTAB. Gastrointestinal: Soft and ttp in ruq/epigastric area. No distention.  Musculoskeletal: No lower extremity tenderness nor edema. No gross deformities of extremities. Neurologic:  Normal speech and language. No gross focal neurologic deficits are appreciated.  Skin:  Skin is warm, dry and intact. No rash noted.   ____________________________________________   LABS (all labs ordered are listed, but only abnormal results are displayed)  Labs Reviewed  COMPREHENSIVE METABOLIC PANEL - Abnormal; Notable for the following components:      Result Value   Potassium 3.4 (*)    Glucose,  Bld 110 (*)    BUN 5 (*)    All other components within normal limits  URINALYSIS, ROUTINE W REFLEX MICROSCOPIC - Abnormal; Notable for the following components:   Color, Urine STRAW (*)    All other components within normal limits  LIPASE, BLOOD  CBC  I-STAT TROPONIN, ED   ____________________________________________  EKG   EKG Interpretation  Date/Time:    Ventricular Rate:    PR Interval:    QRS Duration:   QT Interval:    QTC Calculation:   R Axis:     Text Interpretation:         ____________________________________________  RADIOLOGY  Ct Abdomen Pelvis W Contrast  Result Date: 01/25/2018 CLINICAL DATA:  Abdominal infection, heartburn, then awoke with vomiting this morning; recent diverticulitis treated with antibiotics, history breast cancer, melanoma, GERD, hypertension EXAM: CT ABDOMEN AND PELVIS WITH CONTRAST TECHNIQUE: Multidetector CT imaging of the abdomen and pelvis was performed using the standard protocol following bolus administration of intravenous contrast. Sagittal and coronal MPR images reconstructed from axial data set. CONTRAST:   167mL ISOVUE-300 IOPAMIDOL (ISOVUE-300) INJECTION 61% IV. No oral contrast administered. COMPARISON:  None FINDINGS: Lower chest: Lung bases clear Hepatobiliary: Gallbladder surgically absent. Liver unremarkable. No biliary dilatation. Pancreas: Normal appearance Spleen: Normal appearance Adrenals/Urinary Tract: Normal appearance of adrenal glands, kidneys, and visualized ureters/bladder. Portions of distal ureters and bladder are obscured by bowel gas. Stomach/Bowel: Sigmoid diverticulosis without evidence of diverticulitis. Small hiatal hernia. Normal appendix. Bowel loops otherwise unremarkable. Vascular/Lymphatic: Atherosclerotic calcifications aorta without aneurysm. No adenopathy. Reproductive: Uterus surgically absent.  Unremarkable ovaries. Other: No free air or free fluid. Tiny umbilical hernia containing fat. No acute inflammatory process. Musculoskeletal: BILATERAL hip prostheses. Multilevel facet degenerative changes lower lumbar spine. IMPRESSION: Sigmoid diverticulosis without evidence of diverticulitis. No acute intra-abdominal or intrapelvic abnormalities. Electronically Signed   By: Lavonia Dana M.D.   On: 01/25/2018 16:39    ____________________________________________   PROCEDURES  Procedure(s) performed:   Procedures   ____________________________________________   INITIAL IMPRESSION / ASSESSMENT AND PLAN / ED COURSE  As she is an 82 year old female with some risk factors for coronary artery disease I feel like a delta troponin is warranted.  No EKG done yet at this point, any changes on that she may need to be observed in the hospital.  Heart score right now is 3-4 depending on how I interpret her story, EKG will be paramount. Seems to be like a pretty decent story for reflux or pancreatitis however did not make sense of the lightheadedness and generalized weakness.  She had tenderness her abdomen could be a gallbladder issue so we will get a CT scan to evaluate all of  these.  CT scan unremarkable.  Patient attempted to eat some crackers and then felt like it got stuck in the distal esophagus and threw up again with some pickups.  Was tried on Reglan which she tolerated well and was able to eat many crackers and drink a whole Sprite afterwards.  Observe for another 15 to 20 minutes the patient continued to do well will be discharged on same.  She is to follow-up with gastroenterology for possible scope she likely has distal esophagitis or something similar.   Pertinent labs & imaging results that were available during my care of the patient were reviewed by me and considered in my medical decision making (see chart for details).  ____________________________________________  FINAL CLINICAL IMPRESSION(S) / ED DIAGNOSES  Final diagnoses:  Nausea and vomiting, intractability of  vomiting not specified, unspecified vomiting type     MEDICATIONS GIVEN DURING THIS VISIT:  Medications  alum & mag hydroxide-simeth (MAALOX/MYLANTA) 200-200-20 MG/5ML suspension 30 mL (30 mLs Oral Given 01/25/18 1609)  ondansetron (ZOFRAN-ODT) disintegrating tablet 4 mg (4 mg Oral Given 01/25/18 1610)  famotidine (PEPCID) tablet 20 mg (20 mg Oral Given 01/25/18 1609)  iopamidol (ISOVUE-300) 61 % injection 100 mL (100 mLs Intravenous Contrast Given 01/25/18 1616)  metoCLOPramide (REGLAN) injection 10 mg (10 mg Intravenous Given 01/25/18 1825)     NEW OUTPATIENT MEDICATIONS STARTED DURING THIS VISIT:  Discharge Medication List as of 01/25/2018  7:31 PM    START taking these medications   Details  famotidine (PEPCID) 20 MG tablet Take 1 tablet (20 mg total) by mouth 2 (two) times daily., Starting Mon 01/25/2018, Print    lidocaine (XYLOCAINE) 2 % solution Use as directed 15 mLs in the mouth or throat as needed for mouth pain., Starting Mon 01/25/2018, Print    metoCLOPramide (REGLAN) 10 MG tablet Take 1 tablet (10 mg total) by mouth every 6 (six) hours as needed for nausea  (nausea/headache)., Starting Mon 01/25/2018, Print        Note:  This note was prepared with assistance of Dragon voice recognition software. Occasional wrong-word or sound-a-like substitutions may have occurred due to the inherent limitations of voice recognition software.   Merrily Pew, MD 01/25/18 [phone]

## 2018-01-25 NOTE — ED Notes (Signed)
Pt back from CT, states she has some dizziness, hx vertigo.

## 2018-01-25 NOTE — ED Notes (Signed)
Pt eating crackers with out difficulty. Wanting to go home. Daughter at the bedside, Pt in hallway

## 2018-01-26 ENCOUNTER — Other Ambulatory Visit: Payer: Self-pay | Admitting: *Deleted

## 2018-01-26 NOTE — Patient Outreach (Signed)
[facility]) Care Management  01/26/2018  Julie Sosa November 18, 1935 [id number]   Telephone Screen  Referral Date: 01/25/18 Referral Source: nurse call center  Referral Reason: vomiting hx diverticulitis recommended going to ED  Insurance:HTA    Outreach attempt # 1 successful  Patient is able to verify HIPAA Reviewed and addressed referral to [facility] with patient  She confirms she went to ED on 01/25/18  She was allowed to ventilate her feelings about her ED experience.  She states she received a CAT scan and got 3 Prescriptions that she did fill.  She states she is to follow up with Julie Sosa for GI s/s and to get referral for recommended endoscopy and possible "stretching of my esophagus"   She is aware that her recent s/s are related to her diverticulitis and thrush.  She confirms after getting home late she had not read her discharge instructions from the ED [facility] RN CM strongly encouraged her to read her discharge today and to return a call to CM for any questions.  States she and Julie Sosa had discussed diverticulitis and a diverticulitis diet. CM offered to provide education on diverticulitis and diet but refused She voices she is aware that corn in her meal on Sunday may have caused a flare.  CM discussed the ED dx of esophagitis related to diverticulitis and vomiting.  She voiced understanding.    Social: Julie Sosa lives at home with her husband. She has support of her children and sisters. Her husband and she have oncology concerns She was sick yesterday when going to [facility] to a MD appointment for her and her husband.  Her daughters visited in the ED. She states she is able to get to medical appointments without issues.  She is independent with all her care  Her 2 sisters have diverticulitis and her brother who is deceased has swallowing issues with hx of stretching of his esophagus. She also mentioned her brother did not feel esophagus stretching was not  beneficial for him.  CM encouraged her to see if esophagus stretching if recommended is beneficial for her as she is not her brother  Conditions: history of bilateral breast cancer, hyperlipidemia, hypertension, hiatal hernia and multiple surgeries  Degenerative joint disease, s/p total hip arthroplasty,   Medications: denies concerns with taking medications as prescribed, affording medications, side effects of medications and questions about medications    Appointments: to f/u with Julie Sosa. She reports she has a scheduled appointment in January 2020 but was seen about 3 weeks for a physical  CM discussed with her the importance of not waiting until January 2020 and calling Julie    Advance Directives: She states she has a living will and POA   Consent: [facility] RN CM reviewed [facility] services with patient. Patient gave verbal consent for services.  Plan: [facility] RN CM will close case at this time as patient has been assessed and no needs identified.  Pt encouraged to return a call to [place] CM prn [facility] RN CM sent a successful outreach letter as discussed with [facility] brochure enclosed for review    [name], RN, BSN, [place] Coordinator Office number [phone] Mobile number [phone]  Main [facility] number [phone] Fax number [phone]

## 2018-02-09 ENCOUNTER — Encounter: Payer: Self-pay | Admitting: Gastroenterology

## 2018-02-11 DIAGNOSIS — R112 Nausea with vomiting, unspecified: Secondary | ICD-10-CM | POA: Diagnosis not present

## 2018-02-11 DIAGNOSIS — I700 Atherosclerosis of aorta: Secondary | ICD-10-CM | POA: Diagnosis not present

## 2018-02-16 DIAGNOSIS — Z96643 Presence of artificial hip joint, bilateral: Secondary | ICD-10-CM | POA: Diagnosis not present

## 2018-02-16 DIAGNOSIS — M1611 Unilateral primary osteoarthritis, right hip: Secondary | ICD-10-CM | POA: Diagnosis not present

## 2018-03-08 NOTE — Progress Notes (Signed)
Referring Provider: [name], MD Primary Care Physician:  [name], MD   Reason for Consultation:  Hemorrhoids   IMPRESSION:  Anal fissure on rectal exam Hemorrhoids with intermittent pain, itching, and bleeding Dysphagia to solids x years  GERD treated with Tums PRN Hiatal hernia     - diagnosed in the distant pass    - no prior abdominal imaging beyond recent CT available to me today. Sigmoid diverticulosis with recent clinical diagnosis of diverticulitis Possible stool impaction noted on CT Screening colonoscopy at [facility] 2006  Anal fissure on rectal exam. Will treat medically with local therapy and control of constipation. Plan colonoscopy if symptoms are not responding to local therapy.  She is most bothered by dysphagia. I suspect this is likely GERD. Trial of PPI and lifestyle modifications. UGI to exclude ring, stricture, or mass. Plan EGD if UGI provides an indication or if she is not responding to PPI therapy.     PLAN: Trial of pantoprazole 40 mg daily Nitroglycerin 0.125% ointment applied to the rectm TID x 6-8 weeks UGI series Return to clinic in 4 weeks Consider endoscopy if symptoms are not improving  I consented the patient at the bedside today discussing the risks, benefits, and alternatives to endoscopic evaluation. In particular, we discussed the risks that include, but are not limited to, reaction to medication, cardiopulmonary compromise, bleeding requiring blood transfusion, aspiration resulting in pneumonia, perforation requiring surgery, and even death. We reviewed the risk of missed lesion including polyps or even cancer. The patient acknowledges these risks and asks that we proceed.  HPI: Julie Sosa is a 82 y.o. 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 Willey Blade for further evaluation of hemorrhoids.  The history is obtained through the patient and  review of referral records. Her husband accompanies her to this appointment.  Patient has a history of hypertension, hyperlipidemia, depression, history of bilateral breast cancer, prior cholecystectomy, prior bilateral total hip replacements.  She was recently treated for diverticulitis. The abdominal pain improved with antibiotics. CT of the abdomen pelvis 01/25/2018 showed diverticulosis without diverticulitis.  She had associated constipation at that time. With straining, she developed hemorrhoidal bleeding. History of response to preparation H. However, these are not responding as they have in the past. She reported irritation from external hemorrhoids and concerns about fecal impaction. Records show that Dr. Willey Blade prescribed Proctofoam when a rectal exam revealed mildly irritated external hemorrhoids but she was not aware of this recommendations. Rare intermittent bleeding. Mostly associated with the 4-5th BM. Small dot.   She uses Miralax most days. Will hold for diarrhea. No manual assistance with defecation. No sense of incomplete evaluation. No rectal pain or sensation of rectal tearing.   Long standing history of intermittent hemorrhoids. Primarily prolapsed hemorrhoids associated with itching.  Frequent heartburn requiring Tums. No prior use of prescription therapy. Most bothered by intermittent dysphagia to solids x 10 years. Will sit and wait and it will pass with a swallow. Particularly avoid fried, greasy, and foods with red beef. Intermittent odynophagia. Long-standing pill dysphagia particularly with potassium chloride. She has a hiatal hernia but Dr. Willey Blade told her it was too small to cause her problems. She will eat breakfast lying down in bed. Appetite good. Weight stable. No other associated symptoms. No identified exacerbating or relieving features.   Labs from 12/29/2017 show a hemoglobin of 11.5, MCV 89, RDW 12.8. Labs from 01/25/18:  show a hemoglobin of 12.1, MCV 90, RDW 12.    Referral records show a normal colonoscopy in 2006 at [facility]. She understands the test was normal.   Test for occult blood in the stool was  negative 10/25/2012.  No known family history of colon cancer or polyps.   Past Medical History:  Diagnosis Date  . Anemia   . Anxiety   . Arthritis    osteoarthritis  . Breast cancer ([place])    bilat 2018  . Cancer ([place])    melanoma right arm  . Depression   . Gallstone   . GERD (gastroesophageal reflux disease)    occasionally uses tums or rolaids  . Headache   . Hypercholesteremia   . Hypertension   . Personal history of radiation therapy   . Restless leg syndrome   . UTI (urinary tract infection)     Past Surgical History:  Procedure Laterality Date  . ABDOMINAL HYSTERECTOMY    . BREAST BIOPSY    . BREAST CYST EXCISION    . BREAST LUMPECTOMY Bilateral    2018  . BREAST LUMPECTOMY WITH RADIOACTIVE SEED AND SENTINEL LYMPH NODE BIOPSY Bilateral 10/15/2016   Procedure: RIGHT BREAST LUMPECTOMY WITH BRACKETED RADIOACTIVE SEEDS AND RIGHT SENTINEL LYMPH NODE BIOPSY, LEFT BREAST LUMPECTOMY WITH RADIOACTIVE SEED AND LEFT SENTINEL LYMPH NODE BIOPSY;  Surgeon: [name], MD;  Location: [facility];  Service: General;  Laterality: Bilateral;  . BREAST SURGERY     left breast cyst removed  . CATARACT EXTRACTION W/PHACO  03/11/2012   Procedure: CATARACT EXTRACTION PHACO AND INTRAOCULAR LENS PLACEMENT (IOC);  Surgeon: Tonny Branch, MD;  Location: [facility];  Service: Ophthalmology;  Laterality: Right;  CDE:17.71  . CATARACT EXTRACTION W/PHACO  03/29/2012   Procedure: CATARACT EXTRACTION PHACO AND INTRAOCULAR LENS PLACEMENT (IOC);  Surgeon: Tonny Branch, MD;  Location: [facility];  Service: Ophthalmology;  Laterality: Left;  CDE:17.49  . CHOLECYSTECTOMY  10 yrs ago   Arnoldo Morale  . COLONOSCOPY     before 2009  . EYE SURGERY Bilateral    cataract extraction  . MUSCLE BIOPSY     removal of cyst-right calf  . TOTAL HIP ARTHROPLASTY Left 12/25/2014    Procedure: TOTAL HIP ARTHROPLASTY;  Surgeon: Dereck Leep, MD;  Location: [facility];  Service: Orthopedics;  Laterality: Left;  . TOTAL HIP ARTHROPLASTY Right 06/23/2016   Procedure: TOTAL HIP ARTHROPLASTY;  Surgeon: Dereck Leep, MD;  Location: [facility];  Service: Orthopedics;  Laterality: Right;  . YAG LASER APPLICATION Right 9/98/3382   Procedure: YAG LASER APPLICATION;  Surgeon: Williams Che, MD;  Location: [facility];  Service: Ophthalmology;  Laterality: Right;  . YAG LASER APPLICATION Left 08/10/3974   Procedure: YAG LASER APPLICATION;  Surgeon: Williams Che, MD;  Location: [facility];  Service: Ophthalmology;  Laterality: Left;    Current Outpatient Medications  Medication Sig Dispense Refill  . anastrozole (ARIMIDEX) 1 MG tablet TAKE 1 TABLET BY MOUTH DAILY 90 tablet 0  . atenolol (TENORMIN) 50 MG tablet Take 50 mg by mouth daily. In am.    . hydrochlorothiazide (HYDRODIURIL) 25 MG tablet Take 25 mg by mouth daily. In am.    . polyethylene glycol (MIRALAX / GLYCOLAX) packet Take 17 g by mouth daily.    . potassium chloride SA (K-DUR,KLOR-CON) 20 MEQ tablet Take 20-40 mEq by mouth 2 (two) times daily. 40 MEQ in the morning & 20 MEQ at night after supper    . rOPINIRole (REQUIP) 1  MG tablet Take 1 mg by mouth daily at 8 pm.     . sertraline (ZOLOFT) 100 MG tablet Take 100 mg by mouth daily. In am.    . simvastatin (ZOCOR) 20 MG tablet Take 20 mg by mouth daily. In am.    . traZODone (DESYREL) 100 MG tablet Take 100 mg by mouth at bedtime.   2  . AMBULATORY NON FORMULARY MEDICATION Medication Name: Nitroglycerin ointment 0.125 mg apply a pea size amount rectally three times a day for 6-8 weeks. 1 Tube 1  . famotidine (PEPCID) 20 MG tablet Take 1 tablet (20 mg total) by mouth 2 (two) times daily. (Patient not taking: Reported on 03/09/2018) 10 tablet 0  . lidocaine (XYLOCAINE) 2 % solution Use as directed 15 mLs in the mouth or throat as needed for mouth pain. (Patient not taking: Reported  on 03/09/2018) 300 mL 0  . metoCLOPramide (REGLAN) 10 MG tablet Take 1 tablet (10 mg total) by mouth every 6 (six) hours as needed for nausea (nausea/headache). (Patient not taking: Reported on 03/09/2018) 10 tablet 0  . pantoprazole (PROTONIX) 40 MG tablet Take 1 tablet (40 mg total) by mouth daily. 30 tablet 3   No current facility-administered medications for this visit.     Allergies as of 03/09/2018 - Review Complete 03/09/2018  Allergen Reaction Noted  . Cefuroxime Rash 12/13/2014    Family History  Problem Relation Age of Onset  . Cancer Brother        lung cancer. Never smoked or drink   . Cancer Other 30       breast cancer   . Cancer Sister 74       breast cancer   . Breast cancer Sister   . Thyroid cancer Sister   . Colon cancer Neg Hx   . Esophageal cancer Neg Hx     Social History   Socioeconomic History  . Marital status: Married    Spouse name: Not on file  . Number of children: Not on file  . Years of education: Not on file  . Highest education level: Not on file  Occupational History  . Occupation: Retired  Scientific laboratory technician  . Financial resource strain: Not on file  . Food insecurity:    Worry: Not on file    Inability: Not on file  . Transportation needs:    Medical: Not on file    Non-medical: Not on file  Tobacco Use  . Smoking status: Never Smoker  . Smokeless tobacco: Never Used  Substance and Sexual Activity  . Alcohol use: No  . Drug use: No  . Sexual activity: Yes    Birth control/protection: Surgical  Lifestyle  . Physical activity:    Days per week: Not on file    Minutes per session: Not on file  . Stress: Not on file  Relationships  . Social connections:    Talks on phone: Not on file    Gets together: Not on file    Attends religious service: Not on file    Active member of club or organization: Not on file    Attends meetings of clubs or organizations: Not on file    Relationship status: Not on file  . Intimate partner  violence:    Fear of current or ex partner: Not on file    Emotionally abused: Not on file    Physically abused: Not on file    Forced sexual activity: Not on file  Other Topics Concern  .  Not on file  Social History Narrative  . Not on file    Review of Systems: 12 system ROS is negative except as noted above except for the additions of allergies, arthritis, fatigue, hearing problems, insomnia.  Filed Weights   03/09/18 1022  Weight: 175 lb 6 oz (79.5 kg)    Physical Exam: Vital signs were reviewed. General:   Alert, well-nourished, pleasant and cooperative in NAD Head:  Normocephalic and atraumatic. Eyes:  Sclera clear, no icterus.   Conjunctiva pink. Mouth:  No deformity or lesions.   Neck:  Supple; no thyromegaly. Lungs:  Clear throughout to auscultation.   No wheezes.  Heart:  Regular rate and rhythm; no murmurs Abdomen:  Soft, nontender, central obesity, normal bowel sounds. No rebound or guarding. No hepatosplenomegaly Rectal:   No chemical dermatitis. Small external hemorrhoids. Large fissure present at 9 o'clock. No fistula. No prolapsing hemorrhoids. No rectal prolapse. Normal anocutaneous reflex. No stool in the rectal vault. No mass or fecal impaction. Normal anal resting tone.  Chaperons for rectal exam: Desiree Msk:  Symmetrical without gross deformities. Extremities:  No gross deformities or edema. Neurologic:  Alert and  oriented x4;  grossly nonfocal Skin:  No rash or bruise. Psych:  Alert and cooperative. Normal mood and affect.   Bentlie Catanzaro L. Tarri Glenn, MD, MPH [facility] 03/09/2018, 11:35 AM

## 2018-03-09 ENCOUNTER — Encounter (INDEPENDENT_AMBULATORY_CARE_PROVIDER_SITE_OTHER): Payer: Self-pay

## 2018-03-09 ENCOUNTER — Ambulatory Visit (INDEPENDENT_AMBULATORY_CARE_PROVIDER_SITE_OTHER): Payer: PPO | Admitting: Gastroenterology

## 2018-03-09 ENCOUNTER — Encounter: Payer: Self-pay | Admitting: Gastroenterology

## 2018-03-09 VITALS — BP 104/64 | HR 61 | Ht 62.0 in | Wt 175.4 lb

## 2018-03-09 DIAGNOSIS — K649 Unspecified hemorrhoids: Secondary | ICD-10-CM | POA: Diagnosis not present

## 2018-03-09 DIAGNOSIS — K219 Gastro-esophageal reflux disease without esophagitis: Secondary | ICD-10-CM

## 2018-03-09 DIAGNOSIS — K602 Anal fissure, unspecified: Secondary | ICD-10-CM | POA: Diagnosis not present

## 2018-03-09 DIAGNOSIS — R1310 Dysphagia, unspecified: Secondary | ICD-10-CM

## 2018-03-09 MED ORDER — AMBULATORY NON FORMULARY MEDICATION: 1 refills | Status: DC

## 2018-03-09 MED ORDER — PANTOPRAZOLE SODIUM 40 MG PO TBEC: 40.0000 mg | DELAYED_RELEASE_TABLET | Freq: Every day | ORAL | 3 refills | Status: DC

## 2018-03-09 NOTE — Patient Instructions (Addendum)
For fissure: 1. Sitz bath after a bowel movement as needed (epsome salt in a bathtub with warm water, sit 10 minutes up to twice a day as needed) 2. Citrucel or Metamucil 1 tblsp twice daily, titrate to a soft, formed stool 3. Miralax 1 capful daily if needed to keep stools soft 4. Sit to move bowels no longer than 5 minutes. Resist straining as possible 5. Before wiping after a bowel movement, swipe the paper into a jar of vaseline to give the paper a slight covering of the vaseline until fully healed. This will help reduce irriation and discomfort to the area. 6. If no improvement in 4 weeks will proceed with colonoscopy  For your trouble swallowing: 1. Take pantoprazole 40 mg daily 30-60 minutes before breakfast 2. Upper GI series planned to evaluate for esophageal narrowing, reflux, or ulcer  You have been scheduled for an Upper GI Series at [facility]. Your appointment is on 03-15-18 at 11:00 am. Please arrive 15 minutes prior to your test for registration. Make certain not to have anything to eat or drink after midnight on the night before your test. If you need to reschedule, please contact radiology at [phone]. --------------------------------------------------------------------------------------------------------------- An upper GI series uses x rays to help diagnose problems of the upper GI tract, which includes the esophagus, stomach, and duodenum. The duodenum is the first part of the small intestine. An upper GI series is conducted by a radiology technologist or a radiologist-a doctor who specializes in x-ray imaging-at a hospital or outpatient center. While sitting or standing in front of an x-ray machine, the patient drinks barium liquid, which is often white and has a chalky consistency and taste. The barium liquid coats the lining of the upper GI tract and makes signs of disease show up more clearly on x rays. X-ray video, called fluoroscopy, is used to view the barium  liquid moving through the esophagus, stomach, and duodenum. Additional x rays and fluoroscopy are performed while the patient lies on an x-ray table. To fully coat the upper GI tract with barium liquid, the technologist or radiologist may press on the abdomen or ask the patient to change position. Patients hold still in various positions, allowing the technologist or radiologist to take x rays of the upper GI tract at different angles. If a technologist conducts the upper GI series, a radiologist will later examine the images to look for problems.  This test typically takes about 1 hour to complete  Important: Drink plenty of water (8-10 cups/day) for a few days following the procedure to avoid constipation and blockage. The barium will make your stools white for a few days.  ---------------------------------------------------------------------------------------------------------------------------------------------  We have sent a prescription for nitroglycerin 0.125% gel to [facility]. You should apply a pea size amount to your rectum three times daily x 6-8 weeks.  [facility]'s information is below: Address: [address] [place], [place] [address]  [phone]  *Please DO NOT go directly from our office to pick up this medication! Give the pharmacy 1 day to process the prescription as this is compounded at takes time to make.     --------------------------------------------------------------------------------------------------------------------------------------------

## 2018-03-15 ENCOUNTER — Ambulatory Visit (HOSPITAL_COMMUNITY)
Admission: RE | Admit: 2018-03-15 | Discharge: 2018-03-15 | Disposition: A | Discharge status: Home / Self Care | Payer: PPO | Source: Ambulatory Visit | Attending: Gastroenterology | Admitting: Gastroenterology

## 2018-03-15 DIAGNOSIS — R1310 Dysphagia, unspecified: Secondary | ICD-10-CM | POA: Diagnosis not present

## 2018-03-15 DIAGNOSIS — K219 Gastro-esophageal reflux disease without esophagitis: Secondary | ICD-10-CM | POA: Diagnosis not present

## 2018-03-15 DIAGNOSIS — K649 Unspecified hemorrhoids: Secondary | ICD-10-CM | POA: Diagnosis not present

## 2018-03-15 DIAGNOSIS — K449 Diaphragmatic hernia without obstruction or gangrene: Secondary | ICD-10-CM | POA: Diagnosis not present

## 2018-03-17 ENCOUNTER — Other Ambulatory Visit: Payer: Self-pay | Admitting: Hematology

## 2018-03-24 ENCOUNTER — Ambulatory Visit: Payer: PPO | Admitting: Hematology

## 2018-03-24 ENCOUNTER — Other Ambulatory Visit: Payer: PPO

## 2018-04-19 NOTE — Progress Notes (Signed)
Referring Provider: [name], MD Primary Care Physician:  [name], MD   Chief complaint:  Anal fissure, trouble swallowing   IMPRESSION:  Anal fissure on rectal exam 03/09/18    - symptoms improved despite incomplete treatment with Nitroglycerin ointment Hemorrhoids with intermittent pain, itching, and bleeding Dysphagia to solids x 10 years     - esophageal dysmotility suggested on UGI series    - UGI 03/15/2018:         - small to moderate sized sliding-type hiatal hernia with few episodes of reflux       - nonspecific esophageal motility disorder with occasional disruption of the primary peristaltic wave       - occasional tertiary contractions were noted       - muscular A and mucosal B rings which may explain the patient's dysphasia       - 13 mm barium passed into the stomach. GERD treated with Tums PRN    - Eats breakfast lying in bed    - nonadherent with trial of PPI therapy Small to moderate sliding hiatal hernia  Sigmoid diverticulosis with recent clinical diagnosis of diverticulitis by CT    - abdominal pain improved with antiobiotics Possible stool impaction noted on CT Daily Miralax for constipation Screening colonoscopy at [facility] 2006 No known family history of colon cancer or polyps  She is most bothered by dysphagia. I suspect this is likely GERD +/- concurrent esophageal dysmotility given her UGI findings. Trial of PPI and lifestyle modifications were again dicussed. She would like to proceed with EGD.   Anal fissure is no longer a concern. Continue to treat medically with local therapy and control of constipation. Plan colonoscopy if symptoms are not responding to local therapy.   PLAN: Omeprazole 40 mg daily recommended (reviewed reflux findings on UGI given her resistance) EGD Esophageal manometry and pH testing if EGD if negative Daily PPI recommended Anal fissure treated as indicated, daily Miralax recommended to avoid symptoms  I  consented the patient at the bedside today discussing the risks, benefits, and alternatives to endoscopic evaluation. In particular, we discussed the risks that include, but are not limited to, reaction to medication, cardiopulmonary compromise, bleeding requiring blood transfusion, aspiration resulting in pneumonia, perforation requiring surgery, and even death. We reviewed the risk of missed lesion including polyps or even cancer. The patient acknowledges these risks and asks that we proceed.  HPI: Julie Sosa is a 83 y.o. 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  The interval history is obtained through the patient and review of referral records. Her husband accompanies her to this appointment.   She was diagnosed with a posterior anal fissure and has been using nitroglycerin 0.125% ointment 3 times daily until Christmas. Found the Sitz baths more helpful. Became so busy over Christmas that she forgot to use the nitroglycerin further.   I recommended an empiric trial of pantoprazole 40 mg daily, but, she did not take the medication, or her Pepcid. An upper GI series 03/15/2018 revealed a small to moderate sized sliding-type hiatal hernia with few episodes of reflux.  A nonspecific esophageal motility disorder with occasional disruption of the primary peristaltic wave and occasional tertiary contractions were noted.  There are muscular A and mucosal B rings which may explain the patient's dysphasia.  The 13 mm barium pill passed into the stomach.  No significant rectal complaints today.  She is no longer using the Miralax. Will occasionally need a suppository.   Most concerned about her swallowing. Bothered by coughing when singing. If she gets too hot she feels her esophagus closes and she can't breathe. She feels like something is stuck in her throat that she can't move. Frequent heartburn.  Some odynophagia. Persistent pill dysphagia. Buttermilk provided some oral relief. She no longer drinks soda. She is only drinking green tea. Tired of eat non-spicy foods. No indigestion so she is not using Tums. She is not using Pepcid. She has not been consistently taking omeprazole. She felt like these medications dried her out too much to continue using them.   She continue to eat breakfast in bed but denies lying down in bed while eating. Appetite remains good. Weight remains stable. No other associated symptoms. No identified exacerbating or relieving features.    Past Medical History:  Diagnosis Date  . Anemia   . Anxiety   . Arthritis    osteoarthritis  . Breast cancer ([place])    bilat 2018  . Cancer ([place])    melanoma right arm  . Depression   . Gallstone   . GERD (gastroesophageal reflux disease)    occasionally uses tums or rolaids  . Headache   . Hypercholesteremia   . Hypertension   . Personal history of radiation therapy   . Restless leg syndrome   . UTI (urinary tract infection)     Past Surgical History:  Procedure Laterality Date  . ABDOMINAL HYSTERECTOMY    . BREAST BIOPSY    . BREAST CYST EXCISION    . BREAST LUMPECTOMY Bilateral    2018  . BREAST LUMPECTOMY WITH RADIOACTIVE SEED AND SENTINEL LYMPH NODE BIOPSY Bilateral 10/15/2016   Procedure: RIGHT BREAST LUMPECTOMY WITH BRACKETED RADIOACTIVE SEEDS AND RIGHT SENTINEL LYMPH NODE BIOPSY, LEFT BREAST LUMPECTOMY WITH RADIOACTIVE SEED AND LEFT SENTINEL LYMPH NODE BIOPSY;  Surgeon: [name], MD;  Location: [facility];  Service: General;  Laterality: Bilateral;  . BREAST SURGERY     left breast cyst removed  . CATARACT EXTRACTION W/PHACO  03/11/2012   Procedure: CATARACT EXTRACTION PHACO AND INTRAOCULAR LENS PLACEMENT (IOC);  Surgeon: Tonny Branch, MD;  Location: [facility];  Service: Ophthalmology;  Laterality: Right;  CDE:17.71  . CATARACT EXTRACTION W/PHACO  03/29/2012   Procedure: CATARACT EXTRACTION PHACO AND  INTRAOCULAR LENS PLACEMENT (IOC);  Surgeon: Tonny Branch, MD;  Location: [facility];  Service: Ophthalmology;  Laterality: Left;  CDE:17.49  . CHOLECYSTECTOMY  10 yrs ago   Arnold[facility] Morale  . COLONOSCOPY     before 2009  . EYE SURGERY Bilateral    cataract extraction  . MUSCLE BIOPSY     removal of cyst-right calf  . TOTAL HIP ARTHROPLASTY Left 12/25/2014   Procedure: TOTAL HIP ARTHROPLASTY;  Surgeon: Dereck Leep, MD;  Location: [facility];  Service: Orthopedics;  Laterality: Left;  . TOTAL HIP ARTHROPLASTY Right 06/23/2016   Procedure: TOTAL HIP ARTHROPLASTY;  Surgeon: Dereck Leep, MD;  Location: [facility];  Service: Orthopedics;  Laterality: Right;  . YAG LASER APPLICATION Right 4/43/1540   Procedure: YAG LASER APPLICATION;  Surgeon: Williams Che, MD;  Location: [facility];  Service: Ophthalmology;  Laterality: Right;  . YAG LASER APPLICATION Left 0/86/7619   Procedure: YAG LASER APPLICATION;  Surgeon: Williams Che, MD;  Location: [facility];  Service: Ophthalmology;  Laterality: Left;    Current Outpatient Medications  Medication Sig Dispense Refill  . AMBULATORY NON FORMULARY MEDICATION Medication Name:  Nitroglycerin ointment 0.125 mg apply a pea size amount rectally three times a day for 6-8 weeks. 1 Tube 1  . anastrozole (ARIMIDEX) 1 MG tablet TAKE 1 TABLET BY MOUTH DAILY 90 tablet 0  . atenolol (TENORMIN) 50 MG tablet Take 50 mg by mouth daily. In am.    . famotidine (PEPCID) 20 MG tablet Take 1 tablet (20 mg total) by mouth 2 (two) times daily. 10 tablet 0  . hydrochlorothiazide (HYDRODIURIL) 25 MG tablet Take 25 mg by mouth daily. In am.    . lidocaine (XYLOCAINE) 2 % solution Use as directed 15 mLs in the mouth or throat as needed for mouth pain. 300 mL 0  . pantoprazole (PROTONIX) 40 MG tablet Take 1 tablet (40 mg total) by mouth daily. 30 tablet 3  . polyethylene glycol (MIRALAX / GLYCOLAX) packet Take 17 g by mouth daily.    . potassium chloride SA (K-DUR,KLOR-CON) 20 MEQ tablet Take  20-40 mEq by mouth 2 (two) times daily. 40 MEQ in the morning & 20 MEQ at night after supper    . rOPINIRole (REQUIP) 1 MG tablet Take 1 mg by mouth daily at 8 pm.     . sertraline (ZOLOFT) 100 MG tablet Take 100 mg by mouth daily. In am.    . simvastatin (ZOCOR) 20 MG tablet Take 20 mg by mouth daily. In am.    . traZODone (DESYREL) 100 MG tablet Take 100 mg by mouth at bedtime.   2  . metoCLOPramide (REGLAN) 10 MG tablet Take 1 tablet (10 mg total) by mouth every 6 (six) hours as needed for nausea (nausea/headache). (Patient not taking: Reported on 03/09/2018) 10 tablet 0   No current facility-administered medications for this visit.     Allergies as of 04/20/2018 - Review Complete 04/20/2018  Allergen Reaction Noted  . Cefuroxime Rash 12/13/2014    Family History  Problem Relation Age of Onset  . Cancer Brother        lung cancer. Never smoked or drink   . Cancer Other 30       breast cancer   . Cancer Sister 17       breast cancer   . Breast cancer Sister   . Thyroid cancer Sister   . Colon cancer Neg Hx   . Esophageal cancer Neg Hx     Social History   Socioeconomic History  . Marital status: Married    Spouse name: Not on file  . Number of children: Not on file  . Years of education: Not on file  . Highest education level: Not on file  Occupational History  . Occupation: Retired  Scientific laboratory technician  . Financial resource strain: Not on file  . Food insecurity:    Worry: Not on file    Inability: Not on file  . Transportation needs:    Medical: Not on file    Non-medical: Not on file  Tobacco Use  . Smoking status: Never Smoker  . Smokeless tobacco: Never Used  Substance and Sexual Activity  . Alcohol use: No  . Drug use: No  . Sexual activity: Yes    Birth control/protection: Surgical  Lifestyle  . Physical activity:    Days per week: Not on file    Minutes per session: Not on file  . Stress: Not on file  Relationships  . Social connections:    Talks on  phone: Not on file    Gets together: Not on file    Attends  religious service: Not on file    Active member of club or organization: Not on file    Attends meetings of clubs or organizations: Not on file    Relationship status: Not on file  . Intimate partner violence:    Fear of current or ex partner: Not on file    Emotionally abused: Not on file    Physically abused: Not on file    Forced sexual activity: Not on file  Other Topics Concern  . Not on file  Social History Narrative  . Not on file    Review of Systems: 12 system ROS is negative except as noted above except for the additions of allergies, arthritis, fatigue, hearing problems, insomnia.  Filed Weights   04/20/18 1014  Weight: 177 lb 2 oz (80.3 kg)    Physical Exam: Vital signs were reviewed. General:   Alert, well-nourished, pleasant and cooperative in NAD Head:  Normocephalic and atraumatic. Eyes:  Sclera clear, no icterus.   Conjunctiva pink. Mouth:  No deformity or lesions.   Neck:  Supple; no thyromegaly. Lungs:  Clear throughout to auscultation.   No wheezes.  Heart:  Regular rate and rhythm; no murmurs Abdomen:  Soft, nontender, mild central obesity, normal bowel sounds. No rebound or guarding. No hepatosplenomegaly Rectal:   Deferred. Msk:  Symmetrical without gross deformities. Extremities:  No gross deformities or edema. Neurologic:  Alert and  oriented x4;  grossly nonfocal Skin:  No rash or bruise. Psych:  Alert and cooperative. Normal mood and affect.   Julie Hauth L. Tarri Glenn, MD, MPH [facility] 04/20/2018, 10:56 AM

## 2018-04-20 ENCOUNTER — Encounter: Payer: Self-pay | Admitting: Gastroenterology

## 2018-04-20 ENCOUNTER — Ambulatory Visit: Payer: PPO | Admitting: Gastroenterology

## 2018-04-20 VITALS — BP 100/68 | HR 64 | Ht 61.5 in | Wt 177.1 lb

## 2018-04-20 DIAGNOSIS — K602 Anal fissure, unspecified: Secondary | ICD-10-CM | POA: Diagnosis not present

## 2018-04-20 DIAGNOSIS — R1310 Dysphagia, unspecified: Secondary | ICD-10-CM

## 2018-04-20 DIAGNOSIS — K219 Gastro-esophageal reflux disease without esophagitis: Secondary | ICD-10-CM

## 2018-04-20 DIAGNOSIS — K449 Diaphragmatic hernia without obstruction or gangrene: Secondary | ICD-10-CM | POA: Diagnosis not present

## 2018-04-20 MED ORDER — OMEPRAZOLE 40 MG PO CPDR: 40.0000 mg | DELAYED_RELEASE_CAPSULE | Freq: Every day | ORAL | 3 refills | Status: DC

## 2018-04-20 NOTE — Patient Instructions (Signed)
We have sent the following medications to your pharmacy for you to pick up at your convenience: Omeprazole 40 mg daily    You have been scheduled for an endoscopy. Please follow written instructions given to you at your visit today. If you use inhalers (even only as needed), please bring them with you on the day of your procedure. Your physician has requested that you go to www.startemmi.com and enter the access code given to you at your visit today. This web site gives a general overview about your procedure. However, you should still follow specific instructions given to you by our office regarding your preparation for the procedure.

## 2018-04-21 ENCOUNTER — Encounter: Payer: Self-pay | Admitting: *Deleted

## 2018-04-21 DIAGNOSIS — F32A Depression, unspecified: Secondary | ICD-10-CM | POA: Insufficient documentation

## 2018-04-21 DIAGNOSIS — E785 Hyperlipidemia, unspecified: Secondary | ICD-10-CM | POA: Insufficient documentation

## 2018-04-21 DIAGNOSIS — F329 Major depressive disorder, single episode, unspecified: Secondary | ICD-10-CM | POA: Insufficient documentation

## 2018-04-23 ENCOUNTER — Ambulatory Visit (AMBULATORY_SURGERY_CENTER): Payer: PPO | Admitting: Gastroenterology

## 2018-04-23 ENCOUNTER — Encounter: Payer: Self-pay | Admitting: Gastroenterology

## 2018-04-23 VITALS — BP 102/61 | HR 72 | Temp 97.3°F | Resp 10 | Ht 61.0 in | Wt 177.0 lb

## 2018-04-23 DIAGNOSIS — K2270 Barrett's esophagus without dysplasia: Secondary | ICD-10-CM | POA: Diagnosis not present

## 2018-04-23 DIAGNOSIS — K449 Diaphragmatic hernia without obstruction or gangrene: Secondary | ICD-10-CM

## 2018-04-23 DIAGNOSIS — R1310 Dysphagia, unspecified: Secondary | ICD-10-CM

## 2018-04-23 DIAGNOSIS — K222 Esophageal obstruction: Secondary | ICD-10-CM

## 2018-04-23 MED ORDER — SODIUM CHLORIDE 0.9 % IV SOLN: 500.0000 mL | Freq: Once | INTRAVENOUS | Status: DC

## 2018-04-23 NOTE — Patient Instructions (Signed)
Await pathology results.  Continue present medications, including Omeprazole 40mg  every day for eight weeks.  Return to GI clinic in 4-8 weeks after daily use of Omeprazole to review results.  YOU HAD AN ENDOSCOPIC PROCEDURE TODAY AT [facility]:   Refer to the procedure report that was given to you for any specific questions about what was found during the examination.  If the procedure report does not answer your questions, please call your gastroenterologist to clarify.  If you requested that your care partner not be given the details of your procedure findings, then the procedure report has been included in a sealed envelope for you to review at your convenience later.  YOU SHOULD EXPECT: Some feelings of bloating in the abdomen. Passage of more gas than usual.  Walking can help get rid of the air that was put into your GI tract during the procedure and reduce the bloating. If you had a lower endoscopy (such as a colonoscopy or flexible sigmoidoscopy) you may notice spotting of blood in your stool or on the toilet paper. If you underwent a bowel prep for your procedure, you may not have a normal bowel movement for a few days.  Please Note:  You might notice some irritation and congestion in your nose or some drainage.  This is from the oxygen used during your procedure.  There is no need for concern and it should clear up in a day or so.  SYMPTOMS TO REPORT IMMEDIATELY:    Following upper endoscopy (EGD)  Vomiting of blood or coffee ground material  New chest pain or pain under the shoulder blades  Painful or persistently difficult swallowing  New shortness of breath  Fever of 100F or higher  Black, tarry-looking stools  For urgent or emergent issues, a gastroenterologist can be reached at any hour by calling [phone].   DIET:  We do recommend a small meal at first, but then you may proceed to your regular diet.  Drink plenty of fluids but you should avoid  alcoholic beverages for 24 hours.  ACTIVITY:  You should plan to take it easy for the rest of today and you should NOT DRIVE or use heavy machinery until tomorrow (because of the sedation medicines used during the test).    FOLLOW UP: Our staff will call the number listed on your records the next business day following your procedure to check on you and address any questions or concerns that you may have regarding the information given to you following your procedure. If we do not reach you, we will leave a message.  However, if you are feeling well and you are not experiencing any problems, there is no need to return our call.  We will assume that you have returned to your regular daily activities without incident.  If any biopsies were taken you will be contacted by phone or by letter within the next 1-3 weeks.  Please call us at [phone] if you have not heard about the biopsies in 3 weeks.    SIGNATURES/CONFIDENTIALITY: You and/or your care partner have signed paperwork which will be entered into your electronic medical record.  These signatures attest to the fact that that the information above on your After Visit Summary has been reviewed and is understood.  Full responsibility of the confidentiality of this discharge information lies with you and/or your care-partner.

## 2018-04-23 NOTE — Progress Notes (Signed)
Called to room to assist during endoscopic procedure.  Patient ID and intended procedure confirmed with present staff. Received instructions for my participation in the procedure from the performing physician.  

## 2018-04-23 NOTE — Op Note (Addendum)
[facility] Patient Name: Julie Sosa Procedure Date: 04/23/2018 9:10 AM MRN: [id number] Endoscopist: Thornton Park MD, MD Age: 83 Referring MD:  Date of Birth: 1935-07-13 Gender: Female Account #: [id number] Procedure:                Upper GI endoscopy Indications:              Dysphagia Medicines:                See the Anesthesia note for documentation of the                            administered medications Procedure:                Pre-Anesthesia Assessment:                           - Prior to the procedure, a History and Physical                            was performed, and patient medications and                            allergies were reviewed. The patient's tolerance of                            previous anesthesia was also reviewed. The risks                            and benefits of the procedure and the sedation                            options and risks were discussed with the patient.                            All questions were answered, and informed consent                            was obtained. Prior Anticoagulants: The patient has                            taken no previous anticoagulant or antiplatelet                            agents. ASA Grade Assessment: III - A patient with                            severe systemic disease. After reviewing the risks                            and benefits, the patient was deemed in                            satisfactory condition to undergo the procedure.  After obtaining informed consent, the endoscope was                            passed under direct vision. Throughout the                            procedure, the patient's blood pressure, pulse, and                            oxygen saturations were monitored continuously. The                            Endoscope was introduced through the mouth, and                            advanced to the third part of duodenum.  The upper                            GI endoscopy was accomplished without difficulty.                            The patient tolerated the procedure well. Scope In: Scope Out: Findings:                 The examined esophagus was normal. Mild                            presbyesophagus present in the distal esophagus.                            Biopsies were obtained from the proximal and distal                            esophagus with cold forceps for histology of                            suspected eosinophilic esophagitis.                           A medium-sized hiatal hernia was present.                           The stomach was normal.                           The examined duodenum was normal.                           The exam was otherwise without abnormality. Complications:            No immediate complications. Estimated blood loss:                            Minimal. Estimated Blood Loss:     Estimated blood loss was minimal. Impression:               -  Normal esophagus except for mild presbyesophagus                            in the distal esophagus. Biopsied.                           - Normal stomach.                           - Medium sized hiatal hernia.                           - Normal examined duodenum.                           - The examination was otherwise normal. Recommendation:           - Discharge patient to home.                           - Resume previous diet.                           - Continue present medications. Continue omeprezole                            40 mg daily for 8 weeks.                           - Await pathology results.                           - Return to GI clinic in 4-8 weeks after use of                            daily omeprazole to review these results.                           - Esophageal manometry and pH testing if EGD if                            negative Thornton Park MD, MD 04/23/2018 9:31:05 AM This report has  been signed electronically.

## 2018-04-23 NOTE — Progress Notes (Signed)
To PACU, VSS. Report to [email or web address] 

## 2018-04-23 NOTE — Progress Notes (Signed)
Pt's states no medical or surgical changes since previsit or office visit. 

## 2018-04-26 ENCOUNTER — Inpatient Hospital Stay (HOSPITAL_BASED_OUTPATIENT_CLINIC_OR_DEPARTMENT_OTHER): Payer: PPO | Admitting: Hematology

## 2018-04-26 ENCOUNTER — Inpatient Hospital Stay: Payer: PPO | Attending: Hematology

## 2018-04-26 ENCOUNTER — Telehealth: Payer: Self-pay | Admitting: Hematology

## 2018-04-26 ENCOUNTER — Encounter: Payer: Self-pay | Admitting: Hematology

## 2018-04-26 ENCOUNTER — Telehealth: Payer: Self-pay | Admitting: *Deleted

## 2018-04-26 VITALS — BP 121/71 | HR 60 | Temp 97.9°F | Resp 17 | Ht 61.0 in | Wt 178.2 lb

## 2018-04-26 DIAGNOSIS — R1310 Dysphagia, unspecified: Secondary | ICD-10-CM | POA: Diagnosis not present

## 2018-04-26 DIAGNOSIS — C50211 Malignant neoplasm of upper-inner quadrant of right female breast: Secondary | ICD-10-CM | POA: Insufficient documentation

## 2018-04-26 DIAGNOSIS — Z170 Estrogen receptor positive status [ER+]: Secondary | ICD-10-CM | POA: Insufficient documentation

## 2018-04-26 DIAGNOSIS — C50812 Malignant neoplasm of overlapping sites of left female breast: Secondary | ICD-10-CM

## 2018-04-26 DIAGNOSIS — K219 Gastro-esophageal reflux disease without esophagitis: Secondary | ICD-10-CM

## 2018-04-26 DIAGNOSIS — C50512 Malignant neoplasm of lower-outer quadrant of left female breast: Secondary | ICD-10-CM | POA: Diagnosis not present

## 2018-04-26 DIAGNOSIS — C50811 Malignant neoplasm of overlapping sites of right female breast: Secondary | ICD-10-CM

## 2018-04-26 DIAGNOSIS — Z66 Do not resuscitate: Secondary | ICD-10-CM | POA: Insufficient documentation

## 2018-04-26 DIAGNOSIS — K449 Diaphragmatic hernia without obstruction or gangrene: Secondary | ICD-10-CM | POA: Diagnosis not present

## 2018-04-26 LAB — CBC WITH DIFFERENTIAL (CANCER CENTER ONLY)
Abs Immature Granulocytes: 0.02 10*3/uL (ref 0.00–0.07)
Basophils Absolute: 0 10*3/uL (ref 0.0–0.1)
Basophils Relative: 0 %
Eosinophils Absolute: 0.1 10*3/uL (ref 0.0–0.5)
Eosinophils Relative: 2 %
HEMATOCRIT: 34.8 % — AB (ref 36.0–46.0)
HEMOGLOBIN: 11.4 g/dL — AB (ref 12.0–15.0)
Immature Granulocytes: 0 %
Lymphocytes Relative: 14 %
Lymphs Abs: 0.7 10*3/uL (ref 0.7–4.0)
MCH: 29.4 pg (ref 26.0–34.0)
MCHC: 32.8 g/dL (ref 30.0–36.0)
MCV: 89.7 fL (ref 80.0–100.0)
Monocytes Absolute: 0.4 10*3/uL (ref 0.1–1.0)
Monocytes Relative: 8 %
Neutro Abs: 3.5 10*3/uL (ref 1.7–7.7)
Neutrophils Relative %: 76 %
Platelet Count: 143 10*3/uL — ABNORMAL LOW (ref 150–400)
RBC: 3.88 MIL/uL (ref 3.87–5.11)
RDW: 12.5 % (ref 11.5–15.5)
WBC Count: 4.6 10*3/uL (ref 4.0–10.5)
nRBC: 0 % (ref 0.0–0.2)

## 2018-04-26 LAB — CMP (CANCER CENTER ONLY)
ALT: 15 U/L (ref 0–44)
AST: 13 U/L — AB (ref 15–41)
Albumin: 3.9 g/dL (ref 3.5–5.0)
Alkaline Phosphatase: 62 U/L (ref 38–126)
Anion gap: 8 (ref 5–15)
BUN: 9 mg/dL (ref 8–23)
CO2: 29 mmol/L (ref 22–32)
Calcium: 9.4 mg/dL (ref 8.9–10.3)
Chloride: 102 mmol/L (ref 98–111)
Creatinine: 0.96 mg/dL (ref 0.44–1.00)
GFR, Est AFR Am: >60 mL/min (ref >60)
GFR, Estimated: 56 mL/min — ABNORMAL LOW (ref >60)
Glucose, Bld: 114 mg/dL — ABNORMAL HIGH (ref 70–99)
Potassium: 3.7 mmol/L (ref 3.5–5.1)
Sodium: 139 mmol/L (ref 135–145)
Total Bilirubin: 0.8 mg/dL (ref 0.3–1.2)
Total Protein: 6.7 g/dL (ref 6.5–8.1)

## 2018-04-26 NOTE — Telephone Encounter (Signed)
Spoke with patient about scheduled appts per 01/20 los.  Patient aware of appt.

## 2018-04-26 NOTE — Telephone Encounter (Signed)
  Follow up Call-  Call back number 04/23/2018  Post procedure Call Back phone  # [phone]  Permission to leave phone message Yes  Some recent data might be hidden     Patient questions:  Do you have a fever, pain , or abdominal swelling? No. Pain Score  0 *  Have you tolerated food without any problems? Yes.    Have you been able to return to your normal activities? Yes.    Do you have any questions about your discharge instructions: Diet   No. Medications  No. Follow up visit  No.  Do you have questions or concerns about your Care? No.  Actions: * If pain score is 4 or above: No action needed, pain <4.

## 2018-04-26 NOTE — Progress Notes (Signed)
Julie Sosa   [phone] [phone]   Clinic Follow up Note   Patient Care Team: [name], MD as PCP - General (Internal Medicine) [name], MD as Consulting Physician (General Surgery) Eppie Gibson, MD as Attending Physician (Radiation Oncology) Truitt Merle, MD as Consulting Physician (Hematology) Delice Bison Charlestine Massed, NP as Nurse Practitioner (Hematology and Oncology)  Date of Service:  04/26/2018  CHIEF COMPLAINT: F/u of Bilateral Breast Cancer   SUMMARY OF ONCOLOGIC HISTORY: Oncology History   Cancer Staging Bilateral breast cancer [facility]) Staging form: Breast, AJCC 8th Edition - Clinical: No stage assigned - Unsigned  Breast cancer of lower-outer quadrant of left female breast [facility]) Staging form: Breast, AJCC 8th Edition - Pathologic stage from 10/15/2016: Stage IA (pT1b, pN0, cM0, G1, ER: Positive, PR: Positive, HER2: Negative) - Signed by Truitt Merle, MD on 10/23/2016  Breast cancer of upper-inner quadrant of right female breast [facility]) Staging form: Breast, AJCC 8th Edition - Pathologic stage from 10/15/2016: Stage IA (pT1a, pN0, cM0, G1, ER: Positive, PR: Positive, HER2: Negative) - Signed by Truitt Merle, MD on 10/23/2016       Bilateral breast cancer ([place])   08/14/2016 Mammogram    IMPRESSION: Further evaluation is suggested for possible distortion in the right breast.  Further evaluation is suggested for possible distortion in the left breast.    08/26/2016 Mammogram    IMPRESSION: 1. There is a suspicious mass in the right breast at 12:30, which is favored to correspond with the distortion identified mammographically.  2.  There is a suspicious mass in the right breast at 1:30.  3. There is an indeterminate elongated mass in the right breast at 12 o'clock.  4. There is an irregular mass in the left breast at 3:30 which is indeterminate. This could be related to the patient's prior surgery, however malignancy  cannot be excluded.  5.  No evidence of bilateral lymphadenopathy.    09/09/2016 Pathology Results    Diagnosis 1. Breast, right, needle core biopsy, 12:30 4 cm fn INVASIVE DUCTAL CARCINOMA, GRADE 1 DUCTAL CARCINOMA IN SITU IS PRESENT 2. Breast, right, needle core biopsy, 1:30 3 cm fn FIBROADENOMA 3. Breast, left, needle core biopsy, 3:30 6 cm fn INVASIVE DUCTAL CARCINOMA, GRADE 1    09/09/2016 Initial Diagnosis    Bilateral breast cancer ([place])    09/09/2016 Receptors her2    Left breast cancer ER 100%+, PR 100%+, HER2-, Ki67 10% Right breast cancer ER 100%+, PR 90%+, HER2-, Ki67 2%    10/07/2016 Pathology Results    Diagnosis Breast, right, needle core biopsy, 12:00 o'clock - FIBROADENOMA. - THERE IS NO EVIDENCE OF MALIGNANCY. - SEE COMMENT.    10/15/2016 Pathology Results    Diagnosis 1. Breast, lumpectomy, Right w/ bracketed seed - INVASIVE DUCTAL CARCINOMA, 0.3 CM, MSBR GRADE 1. - MARGINS NOT INVOLVED. - TWO FIBROADENOMAS WITH CALCIFICATIONS. - PREVIOUS BIOPSY SITES AND CLIPS. 2. Breast, lumpectomy, Left w/seed - INVASIVE DUCTAL CARCINOMA, 1 CM, MSBR GRADE 1. - DUCTAL CARCINOMA IN SITU. - MARGINS NOT INVOLVED. - CLOSEST MARGIN INFERIOR AT 0.15 CM. - PREVIOUS BIOPSY SITE AND CLIPS. 3. Lymph node, sentinel, biopsy, Left axillary #1 - ONE BENIGN LYMPH NODE (0/1). 4. Lymph node, sentinel, biopsy, Left axillary #2 - ONE BENIGN LYMPH NODE (0/1). 5. Lymph node, sentinel, biopsy, Left axillary #3 - BENIGN ADIPOSE TISSUE. - NO LYMPH NODE TISSUE OR MALIGNANCY. 6. Lymph node, sentinel, biopsy, Right axillary #1 - ONE BENIGN LYMPH NODE (0/1). 7. Lymph node, sentinel,  biopsy, Right axillary 32 - ONE BENIGN LYMPH NODE (0/1). 8. Lymph node, sentinel, biopsy, Right axillary - ONE BENIGN LYMPH NODE (0/1) 9. Lymph node, sentinel, biopsy, Right axillary - ONE BENIGN LYMPH NODE (0/1). 10. Lymph node, sentinel, biopsy, Right axillary - ONE BENIGN LYMPH NODE (0/1).    10/15/2016  Surgery    Patient recived a bilateral breast lumpectomy performed by Dr. Barry Dienes    11/19/2016 - 12/11/2016 Radiation Therapy    The left breast was treated to 42.56 Gy in 16 fractions of 2.66 Gy. The right breast was treated to 42.56 Gy in 16 fractions of 2.66 Gy.      01/2017 -  Anti-estrogen oral therapy    Anastrozole daily     Breast cancer of lower-outer quadrant of left female breast ([place])   10/23/2016 Initial Diagnosis    Breast cancer of lower-outer quadrant of left female breast ([place])     Breast cancer of upper-inner quadrant of right female breast ([place])   10/23/2016 Initial Diagnosis    Breast cancer of upper-inner quadrant of right female breast ([place])      CURRENT THERAPY:  Anastrozole 2.5 mg daily started on 01/05/17   INTERVAL HISTORY:  Julie Sosa is here for a follow up of her b/l breast cancer. She was last seen by me 11 months ago. She presents to the clinic today with her husband.  Since her last visit she presented to ED in 01/2018 for N&V which is related to acid reflux. She had work up with GI Dr Tarri Glenn with a upper endoscopy on 04/23/17. The pathology is not back yet. Exam shows she has a medium sized hiatal hernia. She is on Prilosec, Pepcid and Protonix. She notes her voice has changed along with her dysphagia. She notes she is tolerating Anastrozole well. She notes having pain with wearing bras at times. She brought her living will to be on file.    REVIEW OF SYSTEMS:   Constitutional: Denies fevers, chills or abnormal weight loss Eyes: Denies blurriness of vision Ears, nose, mouth, throat, and face: Denies mucositis or sore throat (+) Voice change (+) Dysphagia  Respiratory: Denies cough, dyspnea or wheezes Cardiovascular: Denies palpitation, chest discomfort or lower extremity swelling Gastrointestinal:  Denies nausea, heartburn or change in bowel habits (+) Hiatal hernia (+) Acid Reflux  Skin: Denies abnormal skin rashes Lymphatics: Denies new  lymphadenopathy or easy bruising Neurological:Denies numbness, tingling or new weaknesses Behavioral/Psych: Mood is stable, no new changes  Breast: (+) Pain under right breast when she wears bra All other systems were reviewed with the patient and are negative.  MEDICAL HISTORY:  Past Medical History:  Diagnosis Date  . Anemia   . Anxiety   . Arthritis    osteoarthritis  . Breast cancer ([place])    bilat 2018  . Cancer ([place])    melanoma right arm  . Depression   . Gallstone   . GERD (gastroesophageal reflux disease)    occasionally uses tums or rolaids  . Headache   . Hypercholesteremia   . Hypertension   . Personal history of radiation therapy   . Restless leg syndrome   . UTI (urinary tract infection)     SURGICAL HISTORY: Past Surgical History:  Procedure Laterality Date  . ABDOMINAL HYSTERECTOMY    . BREAST BIOPSY    . BREAST CYST EXCISION    . BREAST LUMPECTOMY Bilateral    2018  . BREAST LUMPECTOMY WITH RADIOACTIVE SEED AND SENTINEL LYMPH NODE BIOPSY Bilateral  10/15/2016   Procedure: RIGHT BREAST LUMPECTOMY WITH BRACKETED RADIOACTIVE SEEDS AND RIGHT SENTINEL LYMPH NODE BIOPSY, LEFT BREAST LUMPECTOMY WITH RADIOACTIVE SEED AND LEFT SENTINEL LYMPH NODE BIOPSY;  Surgeon: [name], MD;  Location: [place];  Service: General;  Laterality: Bilateral;  . BREAST SURGERY     left breast cyst removed  . CATARACT EXTRACTION W/PHACO  03/11/2012   Procedure: CATARACT EXTRACTION PHACO AND INTRAOCULAR LENS PLACEMENT (IOC);  Surgeon: Tonny Branch, MD;  Location: [facility];  Service: Ophthalmology;  Laterality: Right;  CDE:17.71  . CATARACT EXTRACTION W/PHACO  03/29/2012   Procedure: CATARACT EXTRACTION PHACO AND INTRAOCULAR LENS PLACEMENT (IOC);  Surgeon: Tonny Branch, MD;  Location: [facility];  Service: Ophthalmology;  Laterality: Left;  CDE:17.49  . CHOLECYSTECTOMY  10 yrs ago   Arnoldo Morale  . COLONOSCOPY     before 2009  . EYE SURGERY Bilateral    cataract extraction  . MUSCLE BIOPSY      removal of cyst-right calf  . TOTAL HIP ARTHROPLASTY Left 12/25/2014   Procedure: TOTAL HIP ARTHROPLASTY;  Surgeon: Dereck Leep, MD;  Location: [facility];  Service: Orthopedics;  Laterality: Left;  . TOTAL HIP ARTHROPLASTY Right 06/23/2016   Procedure: TOTAL HIP ARTHROPLASTY;  Surgeon: Dereck Leep, MD;  Location: [facility];  Service: Orthopedics;  Laterality: Right;  . YAG LASER APPLICATION Right 2/77/8242   Procedure: YAG LASER APPLICATION;  Surgeon: [place] Che, MD;  Location: [facility];  Service: Ophthalmology;  Laterality: Right;  . YAG LASER APPLICATION Left 3/53/6144   Procedure: YAG LASER APPLICATION;  Surgeon: [place] Che, MD;  Location: [facility];  Service: Ophthalmology;  Laterality: Left;    I have reviewed the social history and family history with the patient and they are unchanged from previous note.  ALLERGIES:  is allergic to cefuroxime.  MEDICATIONS:  Current Outpatient Medications  Medication Sig Dispense Refill  . AMBULATORY NON FORMULARY MEDICATION Medication Name: Nitroglycerin ointment 0.125 mg apply a pea size amount rectally three times a day for 6-8 weeks. 1 Tube 1  . anastrozole (ARIMIDEX) 1 MG tablet TAKE 1 TABLET BY MOUTH DAILY 90 tablet 0  . atenolol (TENORMIN) 50 MG tablet Take 50 mg by mouth daily. In am.    . famotidine (PEPCID) 20 MG tablet Take 1 tablet (20 mg total) by mouth 2 (two) times daily. 10 tablet 0  . hydrochlorothiazide (HYDRODIURIL) 25 MG tablet Take 25 mg by mouth daily. In am.    . lidocaine (XYLOCAINE) 2 % solution Use as directed 15 mLs in the mouth or throat as needed for mouth pain. 300 mL 0  . metoCLOPramide (REGLAN) 10 MG tablet Take 1 tablet (10 mg total) by mouth every 6 (six) hours as needed for nausea (nausea/headache). 10 tablet 0  . omeprazole (PRILOSEC) 40 MG capsule Take 1 capsule (40 mg total) by mouth daily. 30 capsule 3  . pantoprazole (PROTONIX) 40 MG tablet Take 1 tablet (40 mg total) by mouth daily. 30 tablet 3  .  polyethylene glycol (MIRALAX / GLYCOLAX) packet Take 17 g by mouth daily.    . potassium chloride SA (K-DUR,KLOR-CON) 20 MEQ tablet Take 20-40 mEq by mouth 2 (two) times daily. 40 MEQ in the morning & 20 MEQ at night after supper    . rOPINIRole (REQUIP) 1 MG tablet Take 1 mg by mouth daily at 8 pm.     . sertraline (ZOLOFT) 100 MG tablet Take 100 mg by mouth daily. In am.    .  simvastatin (ZOCOR) 20 MG tablet Take 20 mg by mouth daily. In am.    . traZODone (DESYREL) 100 MG tablet Take 100 mg by mouth at bedtime.   2   No current facility-administered medications for this visit.     PHYSICAL EXAMINATION: ECOG PERFORMANCE STATUS: 1 - Symptomatic but completely ambulatory  Vitals:   04/26/18 0942  BP: 121/71  Pulse: 60  Resp: 17  Temp: 97.9 F (36.6 C)  SpO2: 98%   Filed Weights   04/26/18 0942  Weight: 178 lb 3.2 oz (80.8 kg)    GENERAL:alert, no distress and comfortable SKIN: skin color, texture, turgor are normal, no rashes or significant lesions EYES: normal, Conjunctiva are pink and non-injected, sclera clear OROPHARYNX:no exudate, buccal mucosa, and tongue normal (+) Mild erythema at back of throat  NECK: supple, thyroid normal size, non-tender, without nodularity LYMPH:  no palpable lymphadenopathy in the cervical, axillary or inguinal LUNGS: clear to auscultation and percussion with normal breathing effort HEART: regular rate & rhythm and no murmurs and no lower extremity edema ABDOMEN:abdomen soft, non-tender and normal bowel sounds Musculoskeletal:no cyanosis of digits and no clubbing  NEURO: alert & oriented x 3 with fluent speech, no focal motor/sensory deficits BREAST: S/p b/l lumpectomy: Surgical incisions healed well (+) lateral left breast scar tissue and tenderness   LABORATORY DATA:  I have reviewed the data as listed CBC Latest Ref Rng & Units 04/26/2018 01/25/2018 09/29/2017  WBC 4.0 - 10.5 K/uL 4.6 6.6 4.8  Hemoglobin 12.0 - 15.0 g/dL 11.4(L) 12.1 11.7    Hematocrit 36.0 - 46.0 % 34.8(L) 36.8 34.6(L)  Platelets 150 - 400 K/uL 143(L) 199 175     CMP Latest Ref Rng & Units 04/26/2018 01/25/2018 09/29/2017  Glucose 70 - 99 mg/dL 114(H) 110(H) 115(H)  BUN 8 - 23 mg/dL 9 5(L) 10  Creatinine 0.44 - 1.00 mg/dL 0.96 0.87 1.01(H)  Sodium 135 - 145 mmol/L 139 136 134(L)  Potassium 3.5 - 5.1 mmol/L 3.7 3.4(L) 4.3  Chloride 98 - 111 mmol/L 102 98 100  CO2 22 - 32 mmol/L '29 27 26  ' Calcium 8.9 - 10.3 mg/dL 9.4 9.3 9.1  Total Protein 6.5 - 8.1 g/dL 6.7 7.1 6.6  Total Bilirubin 0.3 - 1.2 mg/dL 0.8 1.0 0.7  Alkaline Phos 38 - 126 U/L 62 65 67  AST 15 - 41 U/L 13(L) 20 12(L)  ALT 0 - 44 U/L '15 24 12    ' PROCEDURES   Upper Endoscopy 04/23/18 by Dr. Tarri Glenn IMPRESSION - Normal esophagus except for mild presbyesophagus in the distal esophagus. Biopsied. - Normal stomach. - Medium sized hiatal hernia. - Normal examined duodenum. - The examination was otherwise normal.   RADIOGRAPHIC STUDIES: I have personally reviewed the radiological images as listed and agreed with the findings in the report. No results found.   ASSESSMENT & PLAN:  Julie Sosa is a 83 y.o. female with    1. Bilateral Breast Cancer, upper-outer quadrant of left breast, pT1bN0M0, stage IA, and upper inner quadrant of right breast,  pT1aN0M0, stage IA, both invasive ductal carcinoma, grade 1, ER and PR strongly positive, HER-2 negative -She was diagnosed in 6-10/2016. She is s/p b/l lumpectomy and adjuvant radiation.  -Given very early stage, with tumor <1.0cm, low-grade, I think her risk of recurrence is small given the strongly ER/PR positive and HER-2 negative disease, I do not think we need Oncotype. No adjuvant chemotherapy is needed. -She started antiestrogen therapy with Anastrozole in 01/2017. Tolerating well  She is clinically doing well. Lab reviewed, her CBC and CMP are within normal limits except Hg at 11.4, plt at 143K, Glucose at 114. Her physical exam and her  10/2017 mammogram were unremarkable. There is no clinical concern for recurrence. -Continue Anastrozole -Continue breast cancer surveillance. Mammogram in 10/2018 -F/u in 6 months    2. Genetics  -Due to family history of breast cancer and her personal history of bilateral breast cancer, I recommend genetic counseling. She would like to proceed given she has daughters.  -I will refer her to Genetics   3. Bone health  -Pt had a bone density scan on 11/07/16, which was normal -Next DEXA in 11/2018  4. Dysphagia, Acid Reflux, Hiatal Hernia -Her Acid Reflux is probably secondary to her hiatal hernia   -She 04/2018 Upper endoscopy last week which showed her mild presbyesophagus. Her pathology report of the biopsy is still pending.  -She is on Prilosec, Pepcid and Protonix. I discussed Protonix and Prilosec are similar and she does not need to take both.   -She will continue to follow up with Dr. Tarri Glenn  5. DNR/DNI -She brought her living will paperwork documenting her DNR/DNI. I will leave a copy in her chart     PLAN -Genetic Referral  -Continue anastrozole  -Lab and f/u in 6 months  -Mammogram in 6 month before next OV    No problem-specific Assessment & Plan notes found for this encounter.   Orders Placed This Encounter  Procedures  . MM DIAG BREAST TOMO BILATERAL    Standing Status:   Future    Standing Expiration Date:   04/27/2019    Order Specific Question:   Reason for Exam (SYMPTOM  OR DIAGNOSIS REQUIRED)    Answer:   screening    Order Specific Question:   Preferred imaging location?    Answer:   [facility]  . Ambulatory referral to Genetics    Referral Priority:   Routine    Referral Type:   Consultation    Referral Reason:   Specialty Services Required    Number of Visits Requested:   1   All questions were answered. The patient knows to call the clinic with any problems, questions or concerns. No barriers to learning was detected. I spent 20 minutes  counseling the patient face to face. The total time spent in the appointment was 25 minutes and more than 50% was on counseling and review of test results     Truitt Merle, MD 04/26/2018   I, Joslyn Devon, am acting as scribe for Truitt Merle, MD.   I have reviewed the above documentation for accuracy and completeness, and I agree with the above.

## 2018-04-27 ENCOUNTER — Telehealth: Payer: Self-pay | Admitting: Hematology

## 2018-04-27 NOTE — Telephone Encounter (Signed)
Scheduled appt per 1/20 sch message - pt is aware of appt date and time   

## 2018-05-04 ENCOUNTER — Other Ambulatory Visit: Payer: Self-pay

## 2018-05-04 DIAGNOSIS — E119 Type 2 diabetes mellitus without complications: Secondary | ICD-10-CM | POA: Diagnosis not present

## 2018-05-10 ENCOUNTER — Inpatient Hospital Stay: Payer: PPO | Attending: Hematology | Admitting: Licensed Clinical Social Worker

## 2018-05-10 ENCOUNTER — Encounter: Payer: Self-pay | Admitting: Licensed Clinical Social Worker

## 2018-05-10 ENCOUNTER — Inpatient Hospital Stay: Payer: PPO

## 2018-05-10 DIAGNOSIS — Z315 Encounter for genetic counseling: Secondary | ICD-10-CM

## 2018-05-10 DIAGNOSIS — Z801 Family history of malignant neoplasm of trachea, bronchus and lung: Secondary | ICD-10-CM | POA: Insufficient documentation

## 2018-05-10 DIAGNOSIS — Z803 Family history of malignant neoplasm of breast: Secondary | ICD-10-CM | POA: Diagnosis not present

## 2018-05-10 DIAGNOSIS — C50211 Malignant neoplasm of upper-inner quadrant of right female breast: Secondary | ICD-10-CM | POA: Diagnosis not present

## 2018-05-10 DIAGNOSIS — C50812 Malignant neoplasm of overlapping sites of left female breast: Principal | ICD-10-CM

## 2018-05-10 DIAGNOSIS — Z808 Family history of malignant neoplasm of other organs or systems: Secondary | ICD-10-CM

## 2018-05-10 DIAGNOSIS — C50811 Malignant neoplasm of overlapping sites of right female breast: Secondary | ICD-10-CM

## 2018-05-10 DIAGNOSIS — Z170 Estrogen receptor positive status [ER+]: Principal | ICD-10-CM

## 2018-05-10 DIAGNOSIS — C50512 Malignant neoplasm of lower-outer quadrant of left female breast: Secondary | ICD-10-CM

## 2018-05-10 NOTE — Progress Notes (Signed)
REFERRING PROVIDER: Truitt Merle, MD [address] [place], [place] [address]  PRIMARY PROVIDER:  [name], MD  PRIMARY REASON FOR VISIT:  1. Malignant neoplasm of overlapping sites of both breasts in female, estrogen receptor positive ([place])   2. Family history of breast cancer   3. Family history of lung cancer      HISTORY OF PRESENT ILLNESS:   Julie Sosa, a 83 y.o. female, was seen for a [place] cancer genetics consultation at the request of Dr. Burr Medico due to a personal and family history of breast cancer.  Julie Sosa presents to clinic today to discuss the possibility of a hereditary predisposition to cancer, genetic testing, and to further clarify her future cancer risks, as well as potential cancer risks for family members.   In 2018, at the age of 49, Julie Sosa was diagnosed with bilateral breast cancer. This was treated with lumpectomies, radiation and antiestrogen therapy. She also has a history of a melanoma that was removed from her arm.   CANCER HISTORY:  Oncology History   Cancer Staging Bilateral breast cancer ([place]) Staging form: Breast, AJCC 8th Edition - Clinical: No stage assigned - Unsigned  Breast cancer of lower-outer quadrant of left female breast ([place]) Staging form: Breast, AJCC 8th Edition - Pathologic stage from 10/15/2016: Stage IA (pT1b, pN0, cM0, G1, ER: Positive, PR: Positive, HER2: Negative) - Signed by Truitt Merle, MD on 10/23/2016  Breast cancer of upper-inner quadrant of right female breast [facility]) Staging form: Breast, AJCC 8th Edition - Pathologic stage from 10/15/2016: Stage IA (pT1a, pN0, cM0, G1, ER: Positive, PR: Positive, HER2: Negative) - Signed by Truitt Merle, MD on 10/23/2016       Bilateral breast cancer ([place])   08/14/2016 Mammogram    IMPRESSION: Further evaluation is suggested for possible distortion in the right breast.  Further evaluation is suggested for possible distortion in the left breast.    08/26/2016 Mammogram     IMPRESSION: 1. There is a suspicious mass in the right breast at 12:30, which is favored to correspond with the distortion identified mammographically.  2.  There is a suspicious mass in the right breast at 1:30.  3. There is an indeterminate elongated mass in the right breast at 12 o'clock.  4. There is an irregular mass in the left breast at 3:30 which is indeterminate. This could be related to the patient's prior surgery, however malignancy cannot be excluded.  5.  No evidence of bilateral lymphadenopathy.    09/09/2016 Pathology Results    Diagnosis 1. Breast, right, needle core biopsy, 12:30 4 cm fn INVASIVE DUCTAL CARCINOMA, GRADE 1 DUCTAL CARCINOMA IN SITU IS PRESENT 2. Breast, right, needle core biopsy, 1:30 3 cm fn FIBROADENOMA 3. Breast, left, needle core biopsy, 3:30 6 cm fn INVASIVE DUCTAL CARCINOMA, GRADE 1    09/09/2016 Initial Diagnosis    Bilateral breast cancer ([place])    09/09/2016 Receptors her2    Left breast cancer ER 100%+, PR 100%+, HER2-, Ki67 10% Right breast cancer ER 100%+, PR 90%+, HER2-, Ki67 2%    10/07/2016 Pathology Results    Diagnosis Breast, right, needle core biopsy, 12:00 o'clock - FIBROADENOMA. - THERE IS NO EVIDENCE OF MALIGNANCY. - SEE COMMENT.    10/15/2016 Pathology Results    Diagnosis 1. Breast, lumpectomy, Right w/ bracketed seed - INVASIVE DUCTAL CARCINOMA, 0.3 CM, MSBR GRADE 1. - MARGINS NOT INVOLVED. - TWO FIBROADENOMAS WITH CALCIFICATIONS. - PREVIOUS BIOPSY SITES AND CLIPS. 2. Breast, lumpectomy, Left w/seed -  INVASIVE DUCTAL CARCINOMA, 1 CM, MSBR GRADE 1. - DUCTAL CARCINOMA IN SITU. - MARGINS NOT INVOLVED. - CLOSEST MARGIN INFERIOR AT 0.15 CM. - PREVIOUS BIOPSY SITE AND CLIPS. 3. Lymph node, sentinel, biopsy, Left axillary #1 - ONE BENIGN LYMPH NODE (0/1). 4. Lymph node, sentinel, biopsy, Left axillary #2 - ONE BENIGN LYMPH NODE (0/1). 5. Lymph node, sentinel, biopsy, Left axillary #3 - BENIGN ADIPOSE TISSUE. - NO  LYMPH NODE TISSUE OR MALIGNANCY. 6. Lymph node, sentinel, biopsy, Right axillary #1 - ONE BENIGN LYMPH NODE (0/1). 7. Lymph node, sentinel, biopsy, Right axillary 32 - ONE BENIGN LYMPH NODE (0/1). 8. Lymph node, sentinel, biopsy, Right axillary - ONE BENIGN LYMPH NODE (0/1) 9. Lymph node, sentinel, biopsy, Right axillary - ONE BENIGN LYMPH NODE (0/1). 10. Lymph node, sentinel, biopsy, Right axillary - ONE BENIGN LYMPH NODE (0/1).    10/15/2016 Surgery    Patient recived a bilateral breast lumpectomy performed by Dr. Barry Dienes    11/19/2016 - 12/11/2016 Radiation Therapy    The left breast was treated to 42.56 Gy in 16 fractions of 2.66 Gy. The right breast was treated to 42.56 Gy in 16 fractions of 2.66 Gy.      01/2017 -  Anti-estrogen oral therapy    Anastrozole daily     Breast cancer of lower-outer quadrant of left female breast ([place])   10/23/2016 Initial Diagnosis    Breast cancer of lower-outer quadrant of left female breast ([place])     Breast cancer of upper-inner quadrant of right female breast ([place])   10/23/2016 Initial Diagnosis    Breast cancer of upper-inner quadrant of right female breast ([place])      HORMONAL RISK FACTORS:  Menarche was at age 22.  First live birth at age 16.  OCP use for approximately unknown years.  Ovaries intact: yes.  Hysterectomy: yes.  Menopausal status: postmenopausal.  Mammogram within the last year: yes.  Past Medical History:  Diagnosis Date  . Anemia   . Anxiety   . Arthritis    osteoarthritis  . Breast cancer ([place])    bilat 2018  . Cancer ([place])    melanoma right arm  . Depression   . Family history of breast cancer   . Family history of lung cancer   . Gallstone   . GERD (gastroesophageal reflux disease)    occasionally uses tums or rolaids  . Headache   . Hypercholesteremia   . Hypertension   . Personal history of radiation therapy   . Restless leg syndrome   . UTI (urinary tract infection)     Past Surgical History:   Procedure Laterality Date  . ABDOMINAL HYSTERECTOMY    . BREAST BIOPSY    . BREAST CYST EXCISION    . BREAST LUMPECTOMY Bilateral    2018  . BREAST LUMPECTOMY WITH RADIOACTIVE SEED AND SENTINEL LYMPH NODE BIOPSY Bilateral 10/15/2016   Procedure: RIGHT BREAST LUMPECTOMY WITH BRACKETED RADIOACTIVE SEEDS AND RIGHT SENTINEL LYMPH NODE BIOPSY, LEFT BREAST LUMPECTOMY WITH RADIOACTIVE SEED AND LEFT SENTINEL LYMPH NODE BIOPSY;  Surgeon: [name], MD;  Location: [facility];  Service: General;  Laterality: Bilateral;  . BREAST SURGERY     left breast cyst removed  . CATARACT EXTRACTION W/PHACO  03/11/2012   Procedure: CATARACT EXTRACTION PHACO AND INTRAOCULAR LENS PLACEMENT (IOC);  Surgeon: Tonny Branch, MD;  Location: [facility];  Service: Ophthalmology;  Laterality: Right;  CDE:17.71  . CATARACT EXTRACTION W/PHACO  03/29/2012   Procedure: CATARACT EXTRACTION PHACO AND INTRAOCULAR LENS  PLACEMENT (IOC);  Surgeon: Tonny Branch, MD;  Location: [facility];  Service: Ophthalmology;  Laterality: Left;  CDE:17.49  . CHOLECYSTECTOMY  10 yrs ago   Arnoldo Morale  . COLONOSCOPY     before 2009  . EYE SURGERY Bilateral    cataract extraction  . MUSCLE BIOPSY     removal of cyst-right calf  . TOTAL HIP ARTHROPLASTY Left 12/25/2014   Procedure: TOTAL HIP ARTHROPLASTY;  Surgeon: Dereck Leep, MD;  Location: [facility];  Service: Orthopedics;  Laterality: Left;  . TOTAL HIP ARTHROPLASTY Right 06/23/2016   Procedure: TOTAL HIP ARTHROPLASTY;  Surgeon: Dereck Leep, MD;  Location: [facility];  Service: Orthopedics;  Laterality: Right;  . YAG LASER APPLICATION Right 07/14/8117   Procedure: YAG LASER APPLICATION;  Surgeon: Williams Che, MD;  Location: [facility];  Service: Ophthalmology;  Laterality: Right;  . YAG LASER APPLICATION Left 1/47/8295   Procedure: YAG LASER APPLICATION;  Surgeon: Williams Che, MD;  Location: [facility];  Service: Ophthalmology;  Laterality: Left;    Social History   Socioeconomic History  . Marital  status: Married    Spouse name: Not on file  . Number of children: Not on file  . Years of education: Not on file  . Highest education level: Not on file  Occupational History  . Occupation: Retired  Scientific laboratory technician  . Financial resource strain: Not on file  . Food insecurity:    Worry: Not on file    Inability: Not on file  . Transportation needs:    Medical: Not on file    Non-medical: Not on file  Tobacco Use  . Smoking status: Never Smoker  . Smokeless tobacco: Never Used  Substance and Sexual Activity  . Alcohol use: No  . Drug use: No  . Sexual activity: Yes    Birth control/protection: Surgical  Lifestyle  . Physical activity:    Days per week: Not on file    Minutes per session: Not on file  . Stress: Not on file  Relationships  . Social connections:    Talks on phone: Not on file    Gets together: Not on file    Attends religious service: Not on file    Active member of club or organization: Not on file    Attends meetings of clubs or organizations: Not on file    Relationship status: Not on file  Other Topics Concern  . Not on file  Social History Narrative  . Not on file     FAMILY HISTORY:  We obtained a detailed, 4-generation family history.  Significant diagnoses are listed below: Family History  Problem Relation Age of Onset  . Cancer Brother        lung cancer. Never smoked or drink   . Cancer Other 30       breast cancer   . Cancer Sister 71       breast cancer   . Breast cancer Sister   . Thyroid cancer Sister   . Colon cancer Neg Hx   . Esophageal cancer Neg Hx    Julie Sosa has two daughters, one is 54 and the other is 51, no cancer history. She has grandchildren as well. Julie Sosa had one brother who passed away from lung cancer at 65, and she has 2 sisters. One of her sisters was diagnosed with breast cancer at 27 and is living at 15. This sister had a son and a daughter, her daughter had breast cancer  diagnosed in her 23s and is living in  her 38s. The patient's other sister has never had cancer and is 8.  Julie Sosa mother died at 82 and had a history of seizures. She had approximately 7 brothers and 5 sisters, no cancers that the patient is aware of. No cancers in the patient's maternal cousins that she is aware of. Her maternal grandfather died in his 59s, maternal grandmother died after the age of 38.  Julie Sosa father died at 37 and had emphysema. He had 12 siblings. She does not know how many brothers vs sisters, and there are no cancers she is aware of. No cancers in her paternal cousins, although she has limited information about them. Her paternal grandparents both passed in their 11s.   Julie Sosa is unaware of previous family history of genetic testing for hereditary cancer risks. Patient's maternal ancestors are of [place] descent, and paternal ancestors are of [place] descent. There is no reported Ashkenazi Jewish ancestry. There is no known consanguinity.  GENETIC COUNSELING ASSESSMENT: Julie Sosa is a 83 y.o. female with a personal and family history which is somewhat suggestive of a Hereditary Cancer Predisposition Syndrome. We, therefore, discussed and recommended the following at today's visit.   DISCUSSION: We discussed that about 5-10% of breast cancer cases are hereditary with most cases due to BRCA mutations.  Other genes associated with hereditary breast cancer cases include ATM, CHEK2 and PALB2.  We reviewed the characteristics, features and inheritance patterns of hereditary cancer syndromes. We also discussed genetic testing, including the appropriate family members to test, the process of testing, insurance coverage and turn-around-time for results. We discussed the implications of a negative, positive and/or variant of uncertain significant result. We recommended Ms. Radke pursue genetic testing for the [facility] Common Hereditary Cancers Panel.  The Common Hereditary Cancers Panel offered by  [facility] includes sequencing and/or deletion duplication testing of the following 47 genes: APC, ATM, AXIN2, BARD1, BMPR1A, BRCA1, BRCA2, BRIP1, CDH1, CDKN2A (p14ARF), CDKN2A (p16INK4a), CKD4, CHEK2, CTNNA1, DICER1, EPCAM (Deletion/duplication testing only), GREM1 (promoter region deletion/duplication testing only), KIT, MEN1, MLH1, MSH2, MSH3, MSH6, MUTYH, NBN, NF1, NHTL1, PALB2, PDGFRA, PMS2, POLD1, POLE, PTEN, RAD50, RAD51C, RAD51D, SDHB, SDHC, SDHD, SMAD4, SMARCA4. STK11, TP53, TSC1, TSC2, and VHL.  The following genes were evaluated for sequence changes only: SDHA and HOXB13 c.251G>A variant only.  We discussed that if she is found to have a mutation in one of these genes, it may impact surgical decisions, and alter future medical management recommendations such as increased cancer screenings and consideration of risk reducing surgeries.  A positive result could also have implications for the patient's family members.  A Negative result would mean we were unable to identify a hereditary component to her cancer, but does not rule out the possibility of a hereditary basis for her cancer.  There could be mutations that are undetectable by current technology, or in genes not yet tested or identified to increase cancer risk.    We discussed the potential to find a Variant of Uncertain Significance or VUS.  These are variants that have not yet been identified as pathogenic or benign, and it is unknown if this variant is associated with increased cancer risk or if this is a normal finding.  Most VUS's are reclassified to benign or likely benign.   It should not be used to make medical management decisions. With time, we suspect the lab will determine the significance of any VUS's identified if any.  Based on Ms. Kelsay's personal and family history of cancer, she meets NCCN medical criteria for genetic testing. Despite that she meets criteria, she may still have an out of pocket cost. The lab will notify her of  an OOP if any.  PLAN: After considering the risks, benefits, and limitations, Ms. Holderman  provided informed consent to pursue genetic testing and the blood sample was sent to [facility] for analysis of the Common Hereditary Cancers Panel. Results should be available within approximately 2-3 weeks' time, at which point they will be disclosed by telephone to Ms. Townsel, as will any additional recommendations warranted by these results. Ms. Vanorman will receive a summary of her genetic counseling visit and a copy of her results once available. This information will also be available in Epic.   Lastly, we encouraged Ms. Lesko to remain in contact with cancer genetics annually so that we can continuously update the family history and inform her of any changes in cancer genetics and testing that may be of benefit for this family.   Ms.  Skilton questions were answered to her satisfaction today. Our contact information was provided should additional questions or concerns arise. Thank you for the referral and allowing [place] to share in the care of your patient.   Faith Rogue, MS Genetic Counselor [email or web address] Phone: [phone]   The patient was seen for a total of 40 minutes in face-to-face genetic counseling.  The patient was accompanied today by her husband Warden Fillers.

## 2018-05-11 DIAGNOSIS — R490 Dysphonia: Secondary | ICD-10-CM | POA: Diagnosis not present

## 2018-05-11 DIAGNOSIS — E1129 Type 2 diabetes mellitus with other diabetic kidney complication: Secondary | ICD-10-CM | POA: Diagnosis not present

## 2018-05-11 DIAGNOSIS — D696 Thrombocytopenia, unspecified: Secondary | ICD-10-CM | POA: Diagnosis not present

## 2018-05-18 ENCOUNTER — Encounter: Payer: Self-pay | Admitting: Licensed Clinical Social Worker

## 2018-05-18 ENCOUNTER — Ambulatory Visit: Payer: Self-pay | Admitting: Licensed Clinical Social Worker

## 2018-05-18 ENCOUNTER — Telehealth: Payer: Self-pay | Admitting: Licensed Clinical Social Worker

## 2018-05-18 DIAGNOSIS — Z1379 Encounter for other screening for genetic and chromosomal anomalies: Secondary | ICD-10-CM | POA: Insufficient documentation

## 2018-05-18 NOTE — Progress Notes (Signed)
HPI:  Ms. Menchaca was previously seen in the [place] clinic on 05/10/2018 due to a personal and family history of breast cancer and concerns regarding a hereditary predisposition to cancer. Please refer to our prior cancer genetics clinic note for more information regarding Ms. Tonnesen's medical, social and family histories, and our assessment and recommendations, at the time. Ms. Reierson recent genetic test results were disclosed to her, as well as recommendations warranted by these results. These results and recommendations are discussed in more detail below.  CANCER HISTORY:  Oncology History   Cancer Staging Bilateral breast cancer ([place]) Staging form: Breast, AJCC 8th Edition - Clinical: No stage assigned - Unsigned  Breast cancer of lower-outer quadrant of left female breast ([place]) Staging form: Breast, AJCC 8th Edition - Pathologic stage from 10/15/2016: Stage IA (pT1b, pN0, cM0, G1, ER: Positive, PR: Positive, HER2: Negative) - Signed by Truitt Merle, MD on 10/23/2016  Breast cancer of upper-inner quadrant of right female breast [facility]) Staging form: Breast, AJCC 8th Edition - Pathologic stage from 10/15/2016: Stage IA (pT1a, pN0, cM0, G1, ER: Positive, PR: Positive, HER2: Negative) - Signed by Truitt Merle, MD on 10/23/2016       Bilateral breast cancer ([place])   08/14/2016 Mammogram    IMPRESSION: Further evaluation is suggested for possible distortion in the right breast.  Further evaluation is suggested for possible distortion in the left breast.    08/26/2016 Mammogram    IMPRESSION: 1. There is a suspicious mass in the right breast at 12:30, which is favored to correspond with the distortion identified mammographically.  2.  There is a suspicious mass in the right breast at 1:30.  3. There is an indeterminate elongated mass in the right breast at 12 o'clock.  4. There is an irregular mass in the left breast at 3:30 which is indeterminate. This could be related  to the patient's prior surgery, however malignancy cannot be excluded.  5.  No evidence of bilateral lymphadenopathy.    09/09/2016 Pathology Results    Diagnosis 1. Breast, right, needle core biopsy, 12:30 4 cm fn INVASIVE DUCTAL CARCINOMA, GRADE 1 DUCTAL CARCINOMA IN SITU IS PRESENT 2. Breast, right, needle core biopsy, 1:30 3 cm fn FIBROADENOMA 3. Breast, left, needle core biopsy, 3:30 6 cm fn INVASIVE DUCTAL CARCINOMA, GRADE 1    09/09/2016 Initial Diagnosis    Bilateral breast cancer ([place])    09/09/2016 Receptors her2    Left breast cancer ER 100%+, PR 100%+, HER2-, Ki67 10% Right breast cancer ER 100%+, PR 90%+, HER2-, Ki67 2%    10/07/2016 Pathology Results    Diagnosis Breast, right, needle core biopsy, 12:00 o'clock - FIBROADENOMA. - THERE IS NO EVIDENCE OF MALIGNANCY. - SEE COMMENT.    10/15/2016 Pathology Results    Diagnosis 1. Breast, lumpectomy, Right w/ bracketed seed - INVASIVE DUCTAL CARCINOMA, 0.3 CM, MSBR GRADE 1. - MARGINS NOT INVOLVED. - TWO FIBROADENOMAS WITH CALCIFICATIONS. - PREVIOUS BIOPSY SITES AND CLIPS. 2. Breast, lumpectomy, Left w/seed - INVASIVE DUCTAL CARCINOMA, 1 CM, MSBR GRADE 1. - DUCTAL CARCINOMA IN SITU. - MARGINS NOT INVOLVED. - CLOSEST MARGIN INFERIOR AT 0.15 CM. - PREVIOUS BIOPSY SITE AND CLIPS. 3. Lymph node, sentinel, biopsy, Left axillary #1 - ONE BENIGN LYMPH NODE (0/1). 4. Lymph node, sentinel, biopsy, Left axillary #2 - ONE BENIGN LYMPH NODE (0/1). 5. Lymph node, sentinel, biopsy, Left axillary #3 - BENIGN ADIPOSE TISSUE. - NO LYMPH NODE TISSUE OR MALIGNANCY. 6. Lymph node, sentinel, biopsy, Right axillary #  1 - ONE BENIGN LYMPH NODE (0/1). 7. Lymph node, sentinel, biopsy, Right axillary 32 - ONE BENIGN LYMPH NODE (0/1). 8. Lymph node, sentinel, biopsy, Right axillary - ONE BENIGN LYMPH NODE (0/1) 9. Lymph node, sentinel, biopsy, Right axillary - ONE BENIGN LYMPH NODE (0/1). 10. Lymph node, sentinel, biopsy, Right  axillary - ONE BENIGN LYMPH NODE (0/1).    10/15/2016 Surgery    Patient recived a bilateral breast lumpectomy performed by Dr. Barry Dienes    11/19/2016 - 12/11/2016 Radiation Therapy    The left breast was treated to 42.56 Gy in 16 fractions of 2.66 Gy. The right breast was treated to 42.56 Gy in 16 fractions of 2.66 Gy.      01/2017 -  Anti-estrogen oral therapy    Anastrozole daily     Breast cancer of lower-outer quadrant of left female breast ([place])   10/23/2016 Initial Diagnosis    Breast cancer of lower-outer quadrant of left female breast [facility])     Breast cancer of upper-inner quadrant of right female breast ([place])   10/23/2016 Initial Diagnosis    Breast cancer of upper-inner quadrant of right female breast ([place])      FAMILY HISTORY:  We obtained a detailed, 4-generation family history.  Significant diagnoses are listed below: Family History  Problem Relation Age of Onset  . Cancer Brother        lung cancer. Never smoked or drink   . Cancer Other 60       breast cancer   . Cancer Sister 58       breast cancer   . Breast cancer Sister   . Thyroid cancer Sister   . Colon cancer Neg Hx   . Esophageal cancer Neg Hx     GENETIC TEST RESULTS: Genetic testing performed through [facility]'s Common Hereditary Cancers Panel reported out on 05/18/2018 showed no pathogenic mutations. The Common Hereditary Cancers Panel offered by [facility] includes sequencing and/or deletion duplication testing of the following 47 genes: APC, ATM, AXIN2, BARD1, BMPR1A, BRCA1, BRCA2, BRIP1, CDH1, CDKN2A (p14ARF), CDKN2A (p16INK4a), CKD4, CHEK2, CTNNA1, DICER1, EPCAM (Deletion/duplication testing only), GREM1 (promoter region deletion/duplication testing only), KIT, MEN1, MLH1, MSH2, MSH3, MSH6, MUTYH, NBN, NF1, NHTL1, PALB2, PDGFRA, PMS2, POLD1, POLE, PTEN, RAD50, RAD51C, RAD51D, SDHB, SDHC, SDHD, SMAD4, SMARCA4. STK11, TP53, TSC1, TSC2, and VHL.  The following genes were evaluated for sequence changes only:  SDHA and HOXB13 c.251G>A variant only.  The test report will be scanned into EPIC and will be located under the Molecular Pathology section of the Results Review tab. A portion of the result report is included below for reference.    We discussed with Ms. Eager that because current genetic testing is not perfect, it is possible there may be a gene mutation in one of these genes that current testing cannot detect, but that chance is small.  We also discussed, that there could be another gene that has not yet been discovered, or that we have not yet tested, that is responsible for the cancer diagnoses in the family. It is also possible there is a hereditary cause for the cancer in the family that Ms. Rivere did not inherit and therefore was not identified in her testing.  Therefore, it is important to remain in touch with cancer genetics in the future so that we can continue to offer Ms. Darden the most up to date genetic testing.   ADDITIONAL GENETIC TESTING: We discussed with Ms. Ress that her genetic testing was fairly extensive.  If there are are genes identified to increase cancer risk that can be analyzed in the future, we would be happy to discuss and coordinate this testing at that time.    CANCER SCREENING RECOMMENDATIONS: Ms. Jacot test result is considered negative (normal).  This means that we have not identified a hereditary cause for her personal and family history of cancer at this time.   This result indicates that it is unlikely Ms. Greenwood has an increased risk for a future cancer due to a mutation in one of these genes. This normal test also suggests that Ms. Adorno's cancer was most likely not due to an inherited predisposition associated with one of these genes.  Most cancers happen by chance and this negative test suggests that her cancer may fall into this category.   While reassuring, this does not definitively rule out a hereditary predisposition to cancer. It is still possible that  there could be genetic mutations that are undetectable by current technology, or genetic mutations in genes that have not been tested or identified to increase cancer risk.  Therefore, it is recommended she continue to follow the cancer management and screening guidelines provided by her oncology and primary healthcare provider. An individual's cancer risk is not determined by genetic test results alone.  Overall cancer risk assessment includes additional factors such as personal medical history, family history, etc.  These should be used to make a personalized plan for cancer prevention and surveillance.    RECOMMENDATIONS FOR FAMILY MEMBERS:  Relatives in this family might be at some increased risk of developing cancer, over the general population risk, simply due to the family history of cancer.  We recommended women in this family have a yearly mammogram beginning at age 34, or 30 years younger than the earliest onset of cancer, an annual clinical breast exam, and perform monthly breast self-exams. Women in this family should also have a gynecological exam as recommended by their primary provider. All family members should have a colonoscopy as directed by their doctors.  All family members should inform their physicians about the family history of cancer so their doctors can make the most appropriate screening recommendations for them.   It is also possible there is a hereditary cause for the cancer in Ms. Droessler's family that she did not inherit and therefore was not identified in her.  We recommended her sister and niece, have genetic counseling and testing. Ms. Heady will let us know if we can be of any assistance in coordinating genetic counseling and/or testing for these family members.   FOLLOW-UP: Lastly, we discussed with Ms. Renstrom that cancer genetics is a rapidly advancing field and it is possible that new genetic tests will be appropriate for her and/or her family members in the future. We  encouraged her to remain in contact with cancer genetics on an annual basis so we can update her personal and family histories and let her know of advances in cancer genetics that may benefit this family.   Our contact number was provided. Ms. Sirianni questions were answered to her satisfaction, and she knows she is welcome to call us at anytime with additional questions or concerns.  Faith Rogue, MS Genetic Counselor [email or web address] Phone: [phone]

## 2018-05-18 NOTE — Telephone Encounter (Signed)
Revealed negative genetic testing. This normal result is reassuring and indicates that it is unlikely Julie Sosa's cancer is due to a hereditary cause.  It is unlikely that there is an increased risk of another cancer due to a mutation in one of these genes.  However, genetic testing is not perfect, and cannot definitively rule out a hereditary cause.  It will be important for her to keep in contact with genetics to learn if any additional testing may be needed in the future.  Her sister and niece could also benefit from genetic counseling and testing.

## 2018-06-02 DIAGNOSIS — J310 Chronic rhinitis: Secondary | ICD-10-CM | POA: Insufficient documentation

## 2018-06-02 DIAGNOSIS — J3489 Other specified disorders of nose and nasal sinuses: Secondary | ICD-10-CM | POA: Diagnosis not present

## 2018-06-02 DIAGNOSIS — K219 Gastro-esophageal reflux disease without esophagitis: Secondary | ICD-10-CM | POA: Insufficient documentation

## 2018-06-02 DIAGNOSIS — R490 Dysphonia: Secondary | ICD-10-CM | POA: Insufficient documentation

## 2018-06-04 ENCOUNTER — Encounter (INDEPENDENT_AMBULATORY_CARE_PROVIDER_SITE_OTHER): Payer: Self-pay

## 2018-06-04 ENCOUNTER — Encounter: Payer: Self-pay | Admitting: Gastroenterology

## 2018-06-04 ENCOUNTER — Ambulatory Visit: Payer: PPO | Admitting: Gastroenterology

## 2018-06-04 VITALS — BP 112/70 | HR 64 | Ht 61.5 in | Wt 177.4 lb

## 2018-06-04 DIAGNOSIS — K2100 Gastro-esophageal reflux disease with esophagitis, without bleeding: Secondary | ICD-10-CM

## 2018-06-04 DIAGNOSIS — R1310 Dysphagia, unspecified: Secondary | ICD-10-CM

## 2018-06-04 DIAGNOSIS — K2270 Barrett's esophagus without dysplasia: Secondary | ICD-10-CM

## 2018-06-04 NOTE — Progress Notes (Signed)
Referring Provider: [name], MD Primary Care Physician:  [name], MD   Chief complaint:  Anal fissure, trouble swallowing   IMPRESSION:  Barrett's esophagus on EGD biopsies 04/23/18 Dysphagia to solids x 10 years     - esophageal dysmotility suggested on UGI series    - UGI 03/15/2018:         - small to moderate sized sliding-type hiatal hernia with few episodes of reflux       - nonspecific esophageal motility disorder with occasional disruption of the primary peristaltic wave       - occasional tertiary contractions were noted       - muscular A and mucosal B rings which may explain the patient's dysphasia       - 13 mm barium passed into the stomach    - Presbyesophagus seen on EGD 04/23/2018 GERD treated with Tums PRN    - Eats breakfast lying in bed    - nonadherent with trial of PPI therapy Posterior anal fissure 03/09/18    - symptoms improved despite incomplete treatment with Nitroglycerin ointment Hemorrhoids with intermittent pain, itching, and bleeding Small to moderate sliding hiatal hernia  Sigmoid diverticulosis with recent clinical diagnosis of diverticulitis by CT    - abdominal pain improved with antiobiotics Possible stool impaction noted on CT Daily Miralax for constipation Screening colonoscopy at [facility] No known family history of colon cancer or polyps  We discussed the diagnosis, natural history, and treatment of Barrett's esophagus.  I recommend another EGD with repeat esophageal biopsies to confirm the diagnosis of Barrett's in 6 months given the overlap and Barrett's and esophagitis. Daily PPI in the meantime.   I have recommend further evaluation of suspected esophageal dysmotility. She would prefer to delay any additional testing until after her thyroid ultrasound.   Continue medical treatment of anal fissure as needed. Controlling constipation reimphasized.  to treat medically with local therapy and control of constipation.  Consider colonoscopy if symptoms do not respond to local therapy.   PLAN: Continue omeprazole 40 mg daily EGD in July to follow-up on new diagnosis of Barrett's Esophageal manometry and pH probe - deferred until after her thyroid examination Anal fissure treated as indicated, daily Miralax recommended to avoid symptoms Return to clinic in 2-3 months  HPI: Julie Sosa is a 83 y.o. 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  The interval history is obtained through the patient and review of referral records. Her husband accompanies her to this appointment.  She had an upper endoscopy for dysphasia 04/23/2018 showing mild presbyesophagus, medium sized hiatal hernia and otherwise normal mucosa.  Esophageal biopsies suggested intestinal metaplasia consistent with Barrett's esophagus.  There was no evidence for dysplasia.  Saw Dr. Erik Obey with [facility] hoarseness and postnasal drainage.  In addition to reflux he thought this may be related to winter drying, drying from Xyzal, cat exposure and possibly aging.  Started on Flonase. He suggested that Dr. Willey Blade do a thryoid scan and this might explain her dysphonia.  [facility]   Dysphagia: Continues to be bothered by coughing when singing.  She gets too hot she feels her esophagus closes and she cannot breathe.  Heartburn is now controlled on omeprazole.  Rare odynophagia.  Continues to have swallowing with large pills - she has a management strategy.  Her  husband notes that she continues to eat breakfast in bed in a supine position.  Posterior anal fissure: Treated with nitroglycerin 0.125% ointment 3 times daily until Christmas. No further bleeding.  Miralax may result in 5-6 small BM in a day. She adjusts her Miralax and suppositories as needed.   No new complaints or concerns.     Past Medical  History:  Diagnosis Date  . Anemia   . Anxiety   . Arthritis    osteoarthritis  . Breast cancer ([place])    bilat 2018  . Cancer ([place])    melanoma right arm  . Depression   . Family history of breast cancer   . Family history of lung cancer   . Gallstone   . GERD (gastroesophageal reflux disease)    occasionally uses tums or rolaids  . Headache   . Hypercholesteremia   . Hypertension   . Personal history of radiation therapy   . Restless leg syndrome   . UTI (urinary tract infection)     Past Surgical History:  Procedure Laterality Date  . ABDOMINAL HYSTERECTOMY    . BREAST BIOPSY    . BREAST CYST EXCISION    . BREAST LUMPECTOMY Bilateral    2018  . BREAST LUMPECTOMY WITH RADIOACTIVE SEED AND SENTINEL LYMPH NODE BIOPSY Bilateral 10/15/2016   Procedure: RIGHT BREAST LUMPECTOMY WITH BRACKETED RADIOACTIVE SEEDS AND RIGHT SENTINEL LYMPH NODE BIOPSY, LEFT BREAST LUMPECTOMY WITH RADIOACTIVE SEED AND LEFT SENTINEL LYMPH NODE BIOPSY;  Surgeon: [name], MD;  Location: [facility];  Service: General;  Laterality: Bilateral;  . BREAST SURGERY     left breast cyst removed  . CATARACT EXTRACTION W/PHACO  03/11/2012   Procedure: CATARACT EXTRACTION PHACO AND INTRAOCULAR LENS PLACEMENT (IOC);  Surgeon: Tonny Branch, MD;  Location: [facility];  Service: Ophthalmology;  Laterality: Right;  CDE:17.71  . CATARACT EXTRACTION W/PHACO  03/29/2012   Procedure: CATARACT EXTRACTION PHACO AND INTRAOCULAR LENS PLACEMENT (IOC);  Surgeon: Tonny Branch, MD;  Location: [facility];  Service: Ophthalmology;  Laterality: Left;  CDE:17.49  . CHOLECYSTECTOMY  10 yrs ago   Arnoldo Morale  . COLONOSCOPY     before 2009  . EYE SURGERY Bilateral    cataract extraction  . MUSCLE BIOPSY     removal of cyst-right calf  . TOTAL HIP ARTHROPLASTY Left 12/25/2014   Procedure: TOTAL HIP ARTHROPLASTY;  Surgeon: Dereck Leep, MD;  Location: [facility];  Service: Orthopedics;  Laterality: Left;  . TOTAL HIP ARTHROPLASTY Right 06/23/2016    Procedure: TOTAL HIP ARTHROPLASTY;  Surgeon: Dereck Leep, MD;  Location: [facility];  Service: Orthopedics;  Laterality: Right;  . YAG LASER APPLICATION Right 09/09/5407   Procedure: YAG LASER APPLICATION;  Surgeon: Williams Che, MD;  Location: [facility];  Service: Ophthalmology;  Laterality: Right;  . YAG LASER APPLICATION Left 11/15/9145   Procedure: YAG LASER APPLICATION;  Surgeon: Williams Che, MD;  Location: [facility];  Service: Ophthalmology;  Laterality: Left;    Current Outpatient Medications  Medication Sig Dispense Refill  . AMBULATORY NON FORMULARY MEDICATION Medication Name: Nitroglycerin ointment 0.125 mg apply a pea size amount rectally three times a day for 6-8 weeks. 1 Tube 1  . anastrozole (ARIMIDEX) 1 MG tablet TAKE 1 TABLET BY MOUTH DAILY 90 tablet 0  . atenolol (TENORMIN) 50 MG tablet Take 50 mg by mouth daily. In am.    . famotidine (PEPCID) 20 MG tablet Take 1 tablet (20 mg total) by mouth 2 (two) times daily.  10 tablet 0  . fluticasone (FLONASE) 50 MCG/ACT nasal spray 2 sprays daily.    . hydrochlorothiazide (HYDRODIURIL) 25 MG tablet Take 25 mg by mouth daily. In am.    . omeprazole (PRILOSEC) 40 MG capsule Take 1 capsule (40 mg total) by mouth daily. 30 capsule 3  . polyethylene glycol (MIRALAX / GLYCOLAX) packet Take 17 g by mouth daily.    . potassium chloride SA (K-DUR,KLOR-CON) 20 MEQ tablet Take 20-40 mEq by mouth 2 (two) times daily. 40 MEQ in the morning & 20 MEQ at night after supper    . rOPINIRole (REQUIP) 1 MG tablet Take 1 mg by mouth daily at 8 pm.     . sertraline (ZOLOFT) 100 MG tablet Take 100 mg by mouth daily. In am.    . simvastatin (ZOCOR) 20 MG tablet Take 20 mg by mouth daily. In am.    . traZODone (DESYREL) 100 MG tablet Take 100 mg by mouth at bedtime.   2   No current facility-administered medications for this visit.     Allergies as of 06/04/2018 - Review Complete 06/04/2018  Allergen Reaction Noted  . Cefuroxime Rash 12/13/2014     Family History  Problem Relation Age of Onset  . Cancer Brother        lung cancer. Never smoked or drink   . Cancer Other 60       breast cancer   . Cancer Sister 49       breast cancer   . Breast cancer Sister   . Thyroid cancer Sister   . Colon cancer Neg Hx   . Esophageal cancer Neg Hx     Social History   Socioeconomic History  . Marital status: Married    Spouse name: Not on file  . Number of children: Not on file  . Years of education: Not on file  . Highest education level: Not on file  Occupational History  . Occupation: Retired  Scientific laboratory technician  . Financial resource strain: Not on file  . Food insecurity:    Worry: Not on file    Inability: Not on file  . Transportation needs:    Medical: Not on file    Non-medical: Not on file  Tobacco Use  . Smoking status: Never Smoker  . Smokeless tobacco: Never Used  Substance and Sexual Activity  . Alcohol use: No  . Drug use: No  . Sexual activity: Yes    Birth control/protection: Surgical  Lifestyle  . Physical activity:    Days per week: Not on file    Minutes per session: Not on file  . Stress: Not on file  Relationships  . Social connections:    Talks on phone: Not on file    Gets together: Not on file    Attends religious service: Not on file    Active member of club or organization: Not on file    Attends meetings of clubs or organizations: Not on file    Relationship status: Not on file  . Intimate partner violence:    Fear of current or ex partner: Not on file    Emotionally abused: Not on file    Physically abused: Not on file    Forced sexual activity: Not on file  Other Topics Concern  . Not on file  Social History Narrative  . Not on file    [facility] Weights   06/04/18 1124  Weight: 177 lb 6 oz (80.5 kg)    Physical  Exam: Vital signs were reviewed. General:   Alert, well-nourished, pleasant and cooperative in NAD.  Appears her stated age. Head:  Normocephalic and atraumatic. Eyes:   Sclera clear, no icterus.   Conjunctiva pink. Mouth:  No deformity or lesions.   Neck:  Supple; no thyromegaly. Lungs:  Clear throughout to auscultation.   No wheezes.  Heart:  Regular rate and rhythm; no murmurs Abdomen:  Soft, nontender, mild central obesity, normal bowel sounds. No rebound or guarding. No hepatosplenomegaly Neurologic:  Alert and  oriented x4;  grossly nonfocal Skin:  No rash or bruise. Psych:  Alert and cooperative. Normal mood and affect.   Jahree Dermody L. Tarri Glenn, MD, MPH [facility] 06/11/2018, 8:50 AM

## 2018-06-04 NOTE — Patient Instructions (Addendum)
If you are age 81 or older, your body mass index should be between 23-30. Your Body mass index is 32.97 kg/m. If this is out of the aforementioned range listed, please consider follow up with your Primary Care Provider.   Continue Omeprazole.   Return to office in 2-3 months.  Thank you for choosing me and [facility].  Dr. Tarri Glenn

## 2018-06-11 ENCOUNTER — Encounter: Payer: Self-pay | Admitting: Gastroenterology

## 2018-06-14 DIAGNOSIS — R5383 Other fatigue: Secondary | ICD-10-CM | POA: Diagnosis not present

## 2018-06-20 ENCOUNTER — Other Ambulatory Visit: Payer: Self-pay | Admitting: Hematology

## 2018-07-27 ENCOUNTER — Other Ambulatory Visit: Payer: Self-pay | Admitting: *Deleted

## 2018-07-27 ENCOUNTER — Encounter: Payer: Self-pay | Admitting: *Deleted

## 2018-07-27 NOTE — Patient Outreach (Signed)
HTA HRA Follow up call.   Julie Sosa confirmed her name and DOB. She participated in the call and agreed to participate in monthly calls over the next 3 months for general health surveillence with focus on breast ca, GI issues and stress management.  Initial assessment completed.  Outpatient Encounter Medications as of 07/27/2018  Medication Sig  . AMBULATORY NON FORMULARY MEDICATION Medication Name: Nitroglycerin ointment 0.125 mg apply a pea size amount rectally three times a day for 6-8 weeks.  Marland Kitchen anastrozole (ARIMIDEX) 1 MG tablet TAKE 1 TABLET BY MOUTH DAILY  . atenolol (TENORMIN) 50 MG tablet Take 50 mg by mouth daily. In am.  . fluticasone (FLONASE) 50 MCG/ACT nasal spray 2 sprays daily.  . hydrochlorothiazide (HYDRODIURIL) 25 MG tablet Take 25 mg by mouth daily. In am.  . omeprazole (PRILOSEC) 40 MG capsule Take 1 capsule (40 mg total) by mouth daily.  . polyethylene glycol (MIRALAX / GLYCOLAX) packet Take 17 g by mouth daily.  . potassium chloride SA (K-DUR,KLOR-CON) 20 MEQ tablet Take 20-40 mEq by mouth 2 (two) times daily. 40 MEQ in the morning & 20 MEQ at night after supper  . rOPINIRole (REQUIP) 1 MG tablet Take 1 mg by mouth daily at 8 pm.   . sertraline (ZOLOFT) 100 MG tablet Take 100 mg by mouth daily. In am.  . simvastatin (ZOCOR) 20 MG tablet Take 20 mg by mouth daily. In am.  . traZODone (DESYREL) 100 MG tablet Take 100 mg by mouth at bedtime.   . [DISCONTINUED] famotidine (PEPCID) 20 MG tablet Take 1 tablet (20 mg total) by mouth 2 (two) times daily.   No facility-administered encounter medications on file as of 07/27/2018.    Fall Risk  07/27/2018 04/01/2017 01/21/2017 10/28/2016  Falls in the past year? 0 Yes No No  Number falls in past yr: - 1 - -  Injury with Fall? - No - -  Risk for fall due to : Impaired balance/gait;Medication side effect - - -  Follow up Falls evaluation completed Falls evaluation completed - -   Depression screen [facility] 2/9 07/27/2018 01/26/2018  04/01/2017 01/21/2017 10/28/2016  Decreased Interest 0 0 0 0 0  Down, Depressed, Hopeless 0 0 0 0 0  PHQ - 2 Score 0 0 0 0 0   THN CM Care Plan Problem One     Most Recent Value  Care Plan Problem One  Recent breast cancer, having had treatment and under surveillance.  Role Documenting the Problem One  Care Management [place] for Problem One  Active  THN Long Term Goal   Pt will report any discomfort or change in breast to NP/MD over the next 3 months.  THN Long Term Goal Start Date  07/27/18  Interventions for Problem One Long Term Goal  Encouraged pt to continue her visits with her oncologist, maintenance medications and reporting of any problem early to prevent complications.    THN CM Care Plan Problem Two     Most Recent Value  Care Plan Problem Two  Possible Barret's esophagus  Role Documenting the Problem Two  Care Management Coordinator  Care Plan for Problem Two  Active  THN CM Short Term Goal #1   Pt will go to next appt for evaluation and follow her diet and medication regimen over the next 30 days.  THN CM Short Term Goal #1 Start Date  07/27/18  Interventions for Short Term Goal #2   Reinforced the importance of follow through  on her GI assessment and recommended treatment.    THN CM Care Plan Problem Three     Most Recent Value  Care Plan Problem Three  Familial stress  Role Documenting the Problem Three  Care Management Coordinator  Care Plan for Problem Three  Active  THN CM Short Term Goal #1   Pt will verbalize or vent feelings to nurse on monthly calls over the next 90 days.  THN CM Short Term Goal #1 Start Date  07/27/18  Interventions for Short Term Goal #1  Encouraged pt to take time for herself, recognize she cannot solve all family issues, vent to a friendly ear.     I will follow Julie Sosa telephonically over the next 3 months for general health concerns.  Eulah Pont. Myrtie Neither, MSN, [facility] Gerontological Nurse Practitioner [facility] Care  Management [phone]

## 2018-08-27 ENCOUNTER — Other Ambulatory Visit: Payer: Self-pay

## 2018-08-27 ENCOUNTER — Other Ambulatory Visit: Payer: Self-pay | Admitting: *Deleted

## 2018-08-27 NOTE — Patient Outreach (Signed)
Monthly telephone call. Julie Sosa is doing very well today. She had a lot to tell me and she updated me on all the issues on her care plan, see below.  THN CM Care Plan Problem One     Most Recent Value  Care Plan Problem One  Recent breast cancer, having had treatment and under surveillance.  Role Documenting the Problem One  Care Management Coordinator  Care Plan for Problem One  Active  THN Long Term Goal   Pt will report andy discomfort or change in breast to NP or MD over the next 3 months.  Interventions for Problem One Long Term Goal  Continue self breast checks monthly and report any changes.    THN CM Care Plan Problem Two     Most Recent Value  Care Plan Problem Two  Possible Barret's esophagus  Role Documenting the Problem Two  Care Management Coordinator  Care Plan for Problem Two  Active  THN CM Short Term Goal #1   Pt will go to next appt for evaluation and follow her diet and medication regimen over the next 30 days.  THN CM Short Term Goal #1 Start Date  07/27/18  THN CM Short Term Goal #1 Met Date   08/27/18    [facility] CM Care Plan Problem Three     Most Recent Value  Care Plan Problem Three  Familial stress  Role Documenting the Problem Three  Care Management Coordinator  Care Plan for Problem Three  Active  THN CM Short Term Goal #1   Pt will verbalize or vent feelings to nurse on monthly calls over the next 90 days.  THN CM Short Term Goal #1 Start Date  07/27/18  Interventions for Short Term Goal #1  Encouraged her to be an encouragment to her sister, it makes one feel good when they can support another person. Continue to do the things that she loves while keeping a safe social distance. Call friends and family to stay in touch.     I will call her again next month on June 26th. I have encouraged her to call me with any problems or concerns. She is most appreciative of these calls.  Eulah Pont. Myrtie Neither, MSN, [facility] Gerontological Nurse Practitioner [facility] Care  Management [phone]

## 2018-09-03 DIAGNOSIS — D696 Thrombocytopenia, unspecified: Secondary | ICD-10-CM | POA: Diagnosis not present

## 2018-09-03 DIAGNOSIS — E1129 Type 2 diabetes mellitus with other diabetic kidney complication: Secondary | ICD-10-CM | POA: Diagnosis not present

## 2018-09-10 DIAGNOSIS — D696 Thrombocytopenia, unspecified: Secondary | ICD-10-CM | POA: Diagnosis not present

## 2018-09-10 DIAGNOSIS — E1129 Type 2 diabetes mellitus with other diabetic kidney complication: Secondary | ICD-10-CM | POA: Diagnosis not present

## 2018-09-10 DIAGNOSIS — E039 Hypothyroidism, unspecified: Secondary | ICD-10-CM | POA: Diagnosis not present

## 2018-09-11 ENCOUNTER — Other Ambulatory Visit: Payer: Self-pay | Admitting: Gastroenterology

## 2018-09-11 ENCOUNTER — Other Ambulatory Visit: Payer: Self-pay | Admitting: Hematology

## 2018-09-20 DIAGNOSIS — C50512 Malignant neoplasm of lower-outer quadrant of left female breast: Secondary | ICD-10-CM | POA: Diagnosis not present

## 2018-09-20 DIAGNOSIS — C50211 Malignant neoplasm of upper-inner quadrant of right female breast: Secondary | ICD-10-CM | POA: Diagnosis not present

## 2018-09-24 ENCOUNTER — Encounter: Payer: Self-pay | Admitting: *Deleted

## 2018-10-01 ENCOUNTER — Ambulatory Visit: Payer: PPO | Admitting: *Deleted

## 2018-10-06 DIAGNOSIS — H5213 Myopia, bilateral: Secondary | ICD-10-CM | POA: Diagnosis not present

## 2018-10-12 ENCOUNTER — Ambulatory Visit
Admission: RE | Admit: 2018-10-12 | Discharge: 2018-10-12 | Disposition: A | Discharge status: Home / Self Care | Payer: PPO | Source: Ambulatory Visit | Attending: Hematology | Admitting: Hematology

## 2018-10-12 DIAGNOSIS — R928 Other abnormal and inconclusive findings on diagnostic imaging of breast: Secondary | ICD-10-CM | POA: Diagnosis not present

## 2018-10-12 DIAGNOSIS — C50811 Malignant neoplasm of overlapping sites of right female breast: Secondary | ICD-10-CM

## 2018-10-12 DIAGNOSIS — C50812 Malignant neoplasm of overlapping sites of left female breast: Secondary | ICD-10-CM

## 2018-10-22 NOTE — Progress Notes (Signed)
[place]   [phone] [phone]   Clinic Follow up Note   Patient Care Team: [name], MD as PCP - General (Internal Medicine) [name], MD as Consulting Physician (General Surgery) Eppie Gibson, MD as Attending Physician (Radiation Oncology) Truitt Merle, MD as Consulting Physician (Hematology) Delice Bison Charlestine Massed, NP as Nurse Practitioner (Hematology and Oncology)  Date of Service:  10/28/2018  CHIEF COMPLAINT: F/u of Bilateral Breast Cancer   SUMMARY OF ONCOLOGIC HISTORY: Oncology History Overview Note  Cancer Staging Bilateral breast cancer [facility]) Staging form: Breast, AJCC 8th Edition - Clinical: No stage assigned - Unsigned  Breast cancer of lower-outer quadrant of left female breast [facility]) Staging form: Breast, AJCC 8th Edition - Pathologic stage from 10/15/2016: Stage IA (pT1b, pN0, cM0, G1, ER: Positive, PR: Positive, HER2: Negative) - Signed by Truitt Merle, MD on 10/23/2016  Breast cancer of upper-inner quadrant of right female breast [facility]) Staging form: Breast, AJCC 8th Edition - Pathologic stage from 10/15/2016: Stage IA (pT1a, pN0, cM0, G1, ER: Positive, PR: Positive, HER2: Negative) - Signed by Truitt Merle, MD on 10/23/2016     Bilateral breast cancer ([place])  08/14/2016 Mammogram   IMPRESSION: Further evaluation is suggested for possible distortion in the right breast.  Further evaluation is suggested for possible distortion in the left breast.   08/26/2016 Mammogram   IMPRESSION: 1. There is a suspicious mass in the right breast at 12:30, which is favored to correspond with the distortion identified mammographically.  2.  There is a suspicious mass in the right breast at 1:30.  3. There is an indeterminate elongated mass in the right breast at 12 o'clock.  4. There is an irregular mass in the left breast at 3:30 which is indeterminate. This could be related to the patient's prior surgery, however malignancy  cannot be excluded.  5.  No evidence of bilateral lymphadenopathy.   09/09/2016 Pathology Results   Diagnosis 1. Breast, right, needle core biopsy, 12:30 4 cm fn INVASIVE DUCTAL CARCINOMA, GRADE 1 DUCTAL CARCINOMA IN SITU IS PRESENT 2. Breast, right, needle core biopsy, 1:30 3 cm fn FIBROADENOMA 3. Breast, left, needle core biopsy, 3:30 6 cm fn INVASIVE DUCTAL CARCINOMA, GRADE 1   09/09/2016 Initial Diagnosis   Bilateral breast cancer ([place])   09/09/2016 Receptors her2   Left breast cancer ER 100%+, PR 100%+, HER2-, Ki67 10% Right breast cancer ER 100%+, PR 90%+, HER2-, Ki67 2%   10/07/2016 Pathology Results   Diagnosis Breast, right, needle core biopsy, 12:00 o'clock - FIBROADENOMA. - THERE IS NO EVIDENCE OF MALIGNANCY. - SEE COMMENT.   10/15/2016 Pathology Results   Diagnosis 1. Breast, lumpectomy, Right w/ bracketed seed - INVASIVE DUCTAL CARCINOMA, 0.3 CM, MSBR GRADE 1. - MARGINS NOT INVOLVED. - TWO FIBROADENOMAS WITH CALCIFICATIONS. - PREVIOUS BIOPSY SITES AND CLIPS. 2. Breast, lumpectomy, Left w/seed - INVASIVE DUCTAL CARCINOMA, 1 CM, MSBR GRADE 1. - DUCTAL CARCINOMA IN SITU. - MARGINS NOT INVOLVED. - CLOSEST MARGIN INFERIOR AT 0.15 CM. - PREVIOUS BIOPSY SITE AND CLIPS. 3. Lymph node, sentinel, biopsy, Left axillary #1 - ONE BENIGN LYMPH NODE (0/1). 4. Lymph node, sentinel, biopsy, Left axillary #2 - ONE BENIGN LYMPH NODE (0/1). 5. Lymph node, sentinel, biopsy, Left axillary #3 - BENIGN ADIPOSE TISSUE. - NO LYMPH NODE TISSUE OR MALIGNANCY. 6. Lymph node, sentinel, biopsy, Right axillary #1 - ONE BENIGN LYMPH NODE (0/1). 7. Lymph node, sentinel, biopsy, Right axillary 32 - ONE BENIGN LYMPH NODE (0/1). 8. Lymph node, sentinel, biopsy,  Right axillary - ONE BENIGN LYMPH NODE (0/1) 9. Lymph node, sentinel, biopsy, Right axillary - ONE BENIGN LYMPH NODE (0/1). 10. Lymph node, sentinel, biopsy, Right axillary - ONE BENIGN LYMPH NODE (0/1).   10/15/2016 Surgery    Patient recived a bilateral breast lumpectomy performed by Dr. Barry Dienes   11/19/2016 - 12/11/2016 Radiation Therapy   The left breast was treated to 42.56 Gy in 16 fractions of 2.66 Gy. The right breast was treated to 42.56 Gy in 16 fractions of 2.66 Gy.     01/2017 -  Anti-estrogen oral therapy   Anastrozole daily   Breast cancer of lower-outer quadrant of left female breast ([place])  10/23/2016 Initial Diagnosis   Breast cancer of lower-outer quadrant of left female breast ([place])   Breast cancer of upper-inner quadrant of right female breast ([place])  10/23/2016 Initial Diagnosis   Breast cancer of upper-inner quadrant of right female breast ([place])      CURRENT THERAPY:  Anastrozole 2.5 mg daily started on10/1/18  INTERVAL HISTORY:  Julie Sosa is here for a follow up of breast cancer. She was last seen bye me 6 months ago. She presents to the clinic alone. She notes she is doing well. She has been staying home. She has no new changes since last visit. She notes right axilla irritation. She notes she has pulled muscles in that area before. She notes she has not been after physically active but does eat well. She tries to be active in her yard if she can. She sees her PCP every 6 months, Summer/Winter.    REVIEW OF SYSTEMS:   Constitutional: Denies fevers, chills or abnormal weight loss Eyes: Denies blurriness of vision Ears, nose, mouth, throat, and face: Denies mucositis or sore throat Respiratory: Denies cough, dyspnea or wheezes Cardiovascular: Denies palpitation, chest discomfort or lower extremity swelling Gastrointestinal:  Denies nausea, heartburn or change in bowel habits Skin: Denies abnormal skin rashes Lymphatics: Denies new lymphadenopathy or easy bruising Neurological:Denies numbness, tingling or new weaknesses Behavioral/Psych: Mood is stable, no new changes  Breast: (+) Right axillary shooting pain and irritation  All other systems were reviewed with the patient and  are negative.  MEDICAL HISTORY:  Past Medical History:  Diagnosis Date  . Anemia   . Anxiety   . Arthritis    osteoarthritis  . Breast cancer ([place])    bilat 2018  . Cancer ([place])    melanoma right arm  . Depression   . Family history of breast cancer   . Family history of lung cancer   . Gallstone   . GERD (gastroesophageal reflux disease)    occasionally uses tums or rolaids  . Headache   . Hypercholesteremia   . Hypertension   . Personal history of radiation therapy   . Restless leg syndrome   . UTI (urinary tract infection)     SURGICAL HISTORY: Past Surgical History:  Procedure Laterality Date  . ABDOMINAL HYSTERECTOMY    . BREAST BIOPSY    . BREAST CYST EXCISION    . BREAST LUMPECTOMY Bilateral    2018  . BREAST LUMPECTOMY WITH RADIOACTIVE SEED AND SENTINEL LYMPH NODE BIOPSY Bilateral 10/15/2016   Procedure: RIGHT BREAST LUMPECTOMY WITH BRACKETED RADIOACTIVE SEEDS AND RIGHT SENTINEL LYMPH NODE BIOPSY, LEFT BREAST LUMPECTOMY WITH RADIOACTIVE SEED AND LEFT SENTINEL LYMPH NODE BIOPSY;  Surgeon: [name], MD;  Location: [place];  Service: General;  Laterality: Bilateral;  . BREAST SURGERY     left breast cyst removed  .  CATARACT EXTRACTION W/PHACO  03/11/2012   Procedure: CATARACT EXTRACTION PHACO AND INTRAOCULAR LENS PLACEMENT (IOC);  Surgeon: Tonny Branch, MD;  Location: [facility];  Service: Ophthalmology;  Laterality: Right;  CDE:17.71  . CATARACT EXTRACTION W/PHACO  03/29/2012   Procedure: CATARACT EXTRACTION PHACO AND INTRAOCULAR LENS PLACEMENT (IOC);  Surgeon: Tonny Branch, MD;  Location: [facility];  Service: Ophthalmology;  Laterality: Left;  CDE:17.49  . CHOLECYSTECTOMY  10 yrs ago   Arnoldo Morale  . COLONOSCOPY     before 2009  . EYE SURGERY Bilateral    cataract extraction  . MUSCLE BIOPSY     removal of cyst-right calf  . TOTAL HIP ARTHROPLASTY Left 12/25/2014   Procedure: TOTAL HIP ARTHROPLASTY;  Surgeon: Dereck Leep, MD;  Location: [facility];  Service: Orthopedics;   Laterality: Left;  . TOTAL HIP ARTHROPLASTY Right 06/23/2016   Procedure: TOTAL HIP ARTHROPLASTY;  Surgeon: Dereck Leep, MD;  Location: [facility];  Service: Orthopedics;  Laterality: Right;  . YAG LASER APPLICATION Right 7/58/8325   Procedure: YAG LASER APPLICATION;  Surgeon: Williams Che, MD;  Location: [facility];  Service: Ophthalmology;  Laterality: Right;  . YAG LASER APPLICATION Left 4/98/2641   Procedure: YAG LASER APPLICATION;  Surgeon: Williams Che, MD;  Location: [facility];  Service: Ophthalmology;  Laterality: Left;    I have reviewed the social history and family history with the patient and they are unchanged from previous note.  ALLERGIES:  is allergic to cefuroxime.  MEDICATIONS:  Current Outpatient Medications  Medication Sig Dispense Refill  . AMBULATORY NON FORMULARY MEDICATION Medication Name: Nitroglycerin ointment 0.125 mg apply a pea size amount rectally three times a day for 6-8 weeks. 1 Tube 1  . anastrozole (ARIMIDEX) 1 MG tablet TAKE 1 TABLET BY MOUTH DAILY 90 tablet 0  . atenolol (TENORMIN) 50 MG tablet Take 50 mg by mouth daily. In am.    . fluticasone (FLONASE) 50 MCG/ACT nasal spray 2 sprays daily.    . hydrochlorothiazide (HYDRODIURIL) 25 MG tablet Take 25 mg by mouth daily. In am.    . polyethylene glycol (MIRALAX / GLYCOLAX) packet Take 17 g by mouth daily.    . potassium chloride SA (K-DUR,KLOR-CON) 20 MEQ tablet Take 20-40 mEq by mouth 2 (two) times daily. 40 MEQ in the morning & 20 MEQ at night after supper    . rOPINIRole (REQUIP) 1 MG tablet Take 1 mg by mouth daily at 8 pm.     . sertraline (ZOLOFT) 100 MG tablet Take 100 mg by mouth daily. In am.    . simvastatin (ZOCOR) 20 MG tablet Take 20 mg by mouth daily. In am.    . traZODone (DESYREL) 100 MG tablet Take 100 mg by mouth at bedtime.   2   No current facility-administered medications for this visit.     PHYSICAL EXAMINATION: ECOG PERFORMANCE STATUS: 0 - Asymptomatic  Vitals:   10/28/18  1405  BP: 119/73  Pulse: 63  Resp: 17  Temp: (!) 97.3 F (36.3 C)  SpO2: 98%   Filed Weights   10/28/18 1405  Weight: 169 lb 1.6 oz (76.7 kg)    GENERAL:alert, no distress and comfortable SKIN: skin color, texture, turgor are normal, no rashes or significant lesions EYES: normal, Conjunctiva are pink and non-injected, sclera clear  NECK: supple, thyroid normal size, non-tender, without nodularity LYMPH:  no palpable lymphadenopathy in the cervical, axillary  LUNGS: clear to auscultation and percussion with normal breathing effort HEART: regular rate &  rhythm and no murmurs and no lower extremity edema ABDOMEN:abdomen soft, non-tender and normal bowel sounds (+) Hiatal hernia, tender  Musculoskeletal:no cyanosis of digits and no clubbing  NEURO: alert & oriented x 3 with fluent speech, no focal motor/sensory deficits BREAST: (+) S/p b/l lumpectomies: Surgical incisions healed well. (+) Scar tissue of right axillary incision (+) 1x2cm scar tissue in 1:00 position of right breast incision (+) 3x3cm scar tissue at 3:00 position of left lateral breast incision (+) Right breast tenderness with right nipple swelling   No adenopathy bilaterally. Breast exam benign.   LABORATORY DATA:  I have reviewed the data as listed CBC Latest Ref Rng & Units 10/28/2018 04/26/2018 01/25/2018  WBC 4.0 - 10.5 K/uL 4.9 4.6 6.6  Hemoglobin 12.0 - 15.0 g/dL 11.7(L) 11.4(L) 12.1  Hematocrit 36.0 - 46.0 % 35.1(L) 34.8(L) 36.8  Platelets 150 - 400 K/uL 156 143(L) 199     CMP Latest Ref Rng & Units 10/28/2018 04/26/2018 01/25/2018  Glucose 70 - 99 mg/dL 97 114(H) 110(H)  BUN 8 - 23 mg/dL 11 9 5(L)  Creatinine 0.44 - 1.00 mg/dL 0.98 0.96 0.87  Sodium 135 - 145 mmol/L 136 139 136  Potassium 3.5 - 5.1 mmol/L 3.9 3.7 3.4(L)  Chloride 98 - 111 mmol/L 100 102 98  CO2 22 - 32 mmol/L _0 Calcium 8.9 - 10.3 mg/dL 9.8 9.4 9.3  Total Protein 6.5 - 8.1 g/dL 7.0 6.7 7.1  Total Bilirubin 0.3 - 1.2 mg/dL 0.9 0.8  1.0  Alkaline Phos 38 - 126 U/L 70 62 65  AST 15 - 41 U/L 18 13(L) 20  ALT 0 - 44 U/L _1 RADIOGRAPHIC STUDIES: I have personally reviewed the radiological images as listed and agreed with the findings in the report. No results found.   ASSESSMENT & PLAN:  Julie Sosa is a 83 y.o. female with   1. Bilateral Breast Cancer, upper-outer quadrant of left breast, pT1bN0M0, stage IA, and upper inner quadrant of right breast, pT1aN0M0, stage IA, both invasive ductal carcinoma, grade 1, ER and PR strongly positive, HER-2 negative -She was diagnosed in 6-10/2016. She is s/p b/l lumpectomy and adjuvant radiation.  -Given very early stage, with tumor <1.0cm, low-grade, I think her risk of recurrence is very low given the strongly ER/PR positive and HER-2 negative disease, I do not think we need Oncotype. No adjuvant chemotherapy is needed. -She started antiestrogen therapy with Anastrozole in 01/2017. Tolerating well -She is clinically doing well. Lab reviewed, her CBC and CMP are within normal limits except Hg 11.7. Her physical exam and her 10/2018 mammogram were unremarkable except b/l scar tissue. There is no clinical concern for recurrence. -Continue surveillance. Next mammogram in 10/2019 -Continue Anastrozole -Phone visit in 6 months, F/u with me in 1 year. She will see her PCP in the interim   2. Genetic Testing was negative  3. Bone health  -Pt had a bone density scan on 11/07/16, which was normal -Next DEXA next year   4. Acid Reflux, Hiatal Hernia -Her Acid Reflux is probably secondary to her hiatal hernia   -04/2018 Upper endoscopy showed her mild presbyesophagus. Her pathology report showed Barrett's Esophagus and benign squamous mucosa -She is on Prilosec, Pepcid and Protonix. I discussed Protonix and Prilosec are similar and she does not need to take both.   -She will continue to follow up with Dr. Tarri Glenn -stable   5. DNR/DNI -She brought her living will  paperwork documenting her DNR/DNI. I will leave a copy in her chart   PLAN -She is clinically doing well  -Continue anastrozole  -Phone visit in 6 months  -Lab and f/u in 1 year    No problem-specific Assessment & Plan notes found for this encounter.   Orders Placed This Encounter  Procedures  . MM DIAG BREAST TOMO BILATERAL    Standing Status:   Future    Standing Expiration Date:   10/28/2019    Order Specific Question:   Reason for Exam (SYMPTOM  OR DIAGNOSIS REQUIRED)    Answer:   screening    Order Specific Question:   Preferred imaging location?    Answer:   [facility]  . DG Bone Density    Standing Status:   Future    Standing Expiration Date:   10/28/2019    Order Specific Question:   Reason for Exam (SYMPTOM  OR DIAGNOSIS REQUIRED)    Answer:   screening    Order Specific Question:   Preferred imaging location?    Answer:   [facility]   All questions were answered. The patient knows to call the clinic with any problems, questions or concerns. No barriers to learning was detected. I spent 15 minutes counseling the patient face to face. The total time spent in the appointment was 20 minutes and more than 50% was on counseling and review of test results     Truitt Merle, MD 10/28/2018   I, Joslyn Devon, am acting as scribe for Truitt Merle, MD.   I have reviewed the above documentation for accuracy and completeness, and I agree with the above.

## 2018-10-28 ENCOUNTER — Other Ambulatory Visit: Payer: Self-pay

## 2018-10-28 ENCOUNTER — Encounter: Payer: Self-pay | Admitting: Hematology

## 2018-10-28 ENCOUNTER — Inpatient Hospital Stay (HOSPITAL_BASED_OUTPATIENT_CLINIC_OR_DEPARTMENT_OTHER): Payer: PPO | Admitting: Hematology

## 2018-10-28 ENCOUNTER — Inpatient Hospital Stay: Payer: PPO | Attending: Hematology

## 2018-10-28 VITALS — BP 119/73 | HR 63 | Temp 97.3°F | Resp 17 | Ht 61.5 in | Wt 169.1 lb

## 2018-10-28 DIAGNOSIS — Z66 Do not resuscitate: Secondary | ICD-10-CM | POA: Diagnosis not present

## 2018-10-28 DIAGNOSIS — C50211 Malignant neoplasm of upper-inner quadrant of right female breast: Secondary | ICD-10-CM | POA: Insufficient documentation

## 2018-10-28 DIAGNOSIS — C50812 Malignant neoplasm of overlapping sites of left female breast: Secondary | ICD-10-CM

## 2018-10-28 DIAGNOSIS — C50512 Malignant neoplasm of lower-outer quadrant of left female breast: Secondary | ICD-10-CM | POA: Diagnosis not present

## 2018-10-28 DIAGNOSIS — K219 Gastro-esophageal reflux disease without esophagitis: Secondary | ICD-10-CM | POA: Diagnosis not present

## 2018-10-28 DIAGNOSIS — C50811 Malignant neoplasm of overlapping sites of right female breast: Secondary | ICD-10-CM

## 2018-10-28 DIAGNOSIS — Z170 Estrogen receptor positive status [ER+]: Secondary | ICD-10-CM

## 2018-10-28 DIAGNOSIS — E2839 Other primary ovarian failure: Secondary | ICD-10-CM

## 2018-10-28 LAB — CMP (CANCER CENTER ONLY)
ALT: 19 U/L (ref 0–44)
AST: 18 U/L (ref 15–41)
Albumin: 4.1 g/dL (ref 3.5–5.0)
Alkaline Phosphatase: 70 U/L (ref 38–126)
Anion gap: 8 (ref 5–15)
BUN: 11 mg/dL (ref 8–23)
CO2: 28 mmol/L (ref 22–32)
Calcium: 9.8 mg/dL (ref 8.9–10.3)
Chloride: 100 mmol/L (ref 98–111)
Creatinine: 0.98 mg/dL (ref 0.44–1.00)
GFR, Est AFR Am: >60 mL/min (ref >60)
GFR, Estimated: 54 mL/min — ABNORMAL LOW (ref >60)
Glucose, Bld: 97 mg/dL (ref 70–99)
Potassium: 3.9 mmol/L (ref 3.5–5.1)
Sodium: 136 mmol/L (ref 135–145)
Total Bilirubin: 0.9 mg/dL (ref 0.3–1.2)
Total Protein: 7 g/dL (ref 6.5–8.1)

## 2018-10-28 LAB — CBC WITH DIFFERENTIAL (CANCER CENTER ONLY)
Abs Immature Granulocytes: 0.01 10*3/uL (ref 0.00–0.07)
Basophils Absolute: 0 10*3/uL (ref 0.0–0.1)
Basophils Relative: 1 %
Eosinophils Absolute: 0.1 10*3/uL (ref 0.0–0.5)
Eosinophils Relative: 1 %
HCT: 35.1 % — ABNORMAL LOW (ref 36.0–46.0)
Hemoglobin: 11.7 g/dL — ABNORMAL LOW (ref 12.0–15.0)
Immature Granulocytes: 0 %
Lymphocytes Relative: 17 %
Lymphs Abs: 0.8 10*3/uL (ref 0.7–4.0)
MCH: 28.8 pg (ref 26.0–34.0)
MCHC: 33.3 g/dL (ref 30.0–36.0)
MCV: 86.5 fL (ref 80.0–100.0)
Monocytes Absolute: 0.5 10*3/uL (ref 0.1–1.0)
Monocytes Relative: 10 %
Neutro Abs: 3.5 10*3/uL (ref 1.7–7.7)
Neutrophils Relative %: 71 %
Platelet Count: 156 10*3/uL (ref 150–400)
RBC: 4.06 MIL/uL (ref 3.87–5.11)
RDW: 13 % (ref 11.5–15.5)
WBC Count: 4.9 10*3/uL (ref 4.0–10.5)
nRBC: 0 % (ref 0.0–0.2)

## 2018-10-29 ENCOUNTER — Telehealth: Payer: Self-pay | Admitting: Hematology

## 2018-10-29 NOTE — Telephone Encounter (Signed)
Scheduled appt per 7/23 los.  Spoke with patient and she is aware of her appt date and time.

## 2018-12-11 ENCOUNTER — Other Ambulatory Visit: Payer: Self-pay | Admitting: Hematology

## 2019-01-10 ENCOUNTER — Other Ambulatory Visit: Payer: Self-pay | Admitting: Gastroenterology

## 2019-01-10 ENCOUNTER — Other Ambulatory Visit: Payer: Self-pay

## 2019-01-10 ENCOUNTER — Ambulatory Visit
Admission: RE | Admit: 2019-01-10 | Discharge: 2019-01-10 | Disposition: A | Discharge status: Home / Self Care | Payer: PPO | Source: Ambulatory Visit | Attending: Hematology | Admitting: Hematology

## 2019-01-10 DIAGNOSIS — Z780 Asymptomatic menopausal state: Secondary | ICD-10-CM | POA: Diagnosis not present

## 2019-01-10 DIAGNOSIS — E2839 Other primary ovarian failure: Secondary | ICD-10-CM

## 2019-01-10 DIAGNOSIS — Z13820 Encounter for screening for osteoporosis: Secondary | ICD-10-CM | POA: Diagnosis not present

## 2019-01-18 DIAGNOSIS — E785 Hyperlipidemia, unspecified: Secondary | ICD-10-CM | POA: Diagnosis not present

## 2019-01-18 DIAGNOSIS — M1990 Unspecified osteoarthritis, unspecified site: Secondary | ICD-10-CM | POA: Diagnosis not present

## 2019-01-18 DIAGNOSIS — D696 Thrombocytopenia, unspecified: Secondary | ICD-10-CM | POA: Diagnosis not present

## 2019-01-18 DIAGNOSIS — Z79899 Other long term (current) drug therapy: Secondary | ICD-10-CM | POA: Diagnosis not present

## 2019-01-18 DIAGNOSIS — Z23 Encounter for immunization: Secondary | ICD-10-CM | POA: Diagnosis not present

## 2019-01-18 DIAGNOSIS — E1129 Type 2 diabetes mellitus with other diabetic kidney complication: Secondary | ICD-10-CM | POA: Diagnosis not present

## 2019-01-18 DIAGNOSIS — E039 Hypothyroidism, unspecified: Secondary | ICD-10-CM | POA: Diagnosis not present

## 2019-01-18 DIAGNOSIS — C50919 Malignant neoplasm of unspecified site of unspecified female breast: Secondary | ICD-10-CM | POA: Diagnosis not present

## 2019-01-24 DIAGNOSIS — Z85820 Personal history of malignant melanoma of skin: Secondary | ICD-10-CM | POA: Diagnosis not present

## 2019-01-24 DIAGNOSIS — L570 Actinic keratosis: Secondary | ICD-10-CM | POA: Diagnosis not present

## 2019-01-24 DIAGNOSIS — D229 Melanocytic nevi, unspecified: Secondary | ICD-10-CM | POA: Diagnosis not present

## 2019-01-24 DIAGNOSIS — L821 Other seborrheic keratosis: Secondary | ICD-10-CM | POA: Diagnosis not present

## 2019-01-25 DIAGNOSIS — E785 Hyperlipidemia, unspecified: Secondary | ICD-10-CM | POA: Diagnosis not present

## 2019-01-25 DIAGNOSIS — N183 Chronic kidney disease, stage 3 unspecified: Secondary | ICD-10-CM | POA: Diagnosis not present

## 2019-01-25 DIAGNOSIS — Z853 Personal history of malignant neoplasm of breast: Secondary | ICD-10-CM | POA: Diagnosis not present

## 2019-01-25 DIAGNOSIS — E1122 Type 2 diabetes mellitus with diabetic chronic kidney disease: Secondary | ICD-10-CM | POA: Diagnosis not present

## 2019-01-25 DIAGNOSIS — I10 Essential (primary) hypertension: Secondary | ICD-10-CM | POA: Diagnosis not present

## 2019-01-28 DIAGNOSIS — H02054 Trichiasis without entropian left upper eyelid: Secondary | ICD-10-CM | POA: Diagnosis not present

## 2019-01-28 DIAGNOSIS — H02051 Trichiasis without entropian right upper eyelid: Secondary | ICD-10-CM | POA: Diagnosis not present

## 2019-02-10 DIAGNOSIS — B029 Zoster without complications: Secondary | ICD-10-CM | POA: Diagnosis not present

## 2019-03-01 DIAGNOSIS — B029 Zoster without complications: Secondary | ICD-10-CM | POA: Diagnosis not present

## 2019-03-11 ENCOUNTER — Other Ambulatory Visit: Payer: Self-pay | Admitting: Hematology

## 2019-03-14 DIAGNOSIS — H02051 Trichiasis without entropian right upper eyelid: Secondary | ICD-10-CM | POA: Diagnosis not present

## 2019-03-30 DIAGNOSIS — L659 Nonscarring hair loss, unspecified: Secondary | ICD-10-CM | POA: Diagnosis not present

## 2019-03-30 DIAGNOSIS — B029 Zoster without complications: Secondary | ICD-10-CM | POA: Diagnosis not present

## 2019-03-30 DIAGNOSIS — I10 Essential (primary) hypertension: Secondary | ICD-10-CM | POA: Diagnosis not present

## 2019-04-07 IMAGING — MG DIGITAL DIAGNOSTIC BILATERAL MAMMOGRAM WITH TOMO AND CAD
6 of 10 series · 6 of 26 positions shown · non-contrast
Comparison: Previous exam(s).

CLINICAL DATA: History of bilateral breast cancers in 0053 status
post bilateral lumpectomies and radiation therapy.

EXAM:
DIGITAL DIAGNOSTIC BILATERAL MAMMOGRAM WITH CAD AND TOMO

[L XCCL]
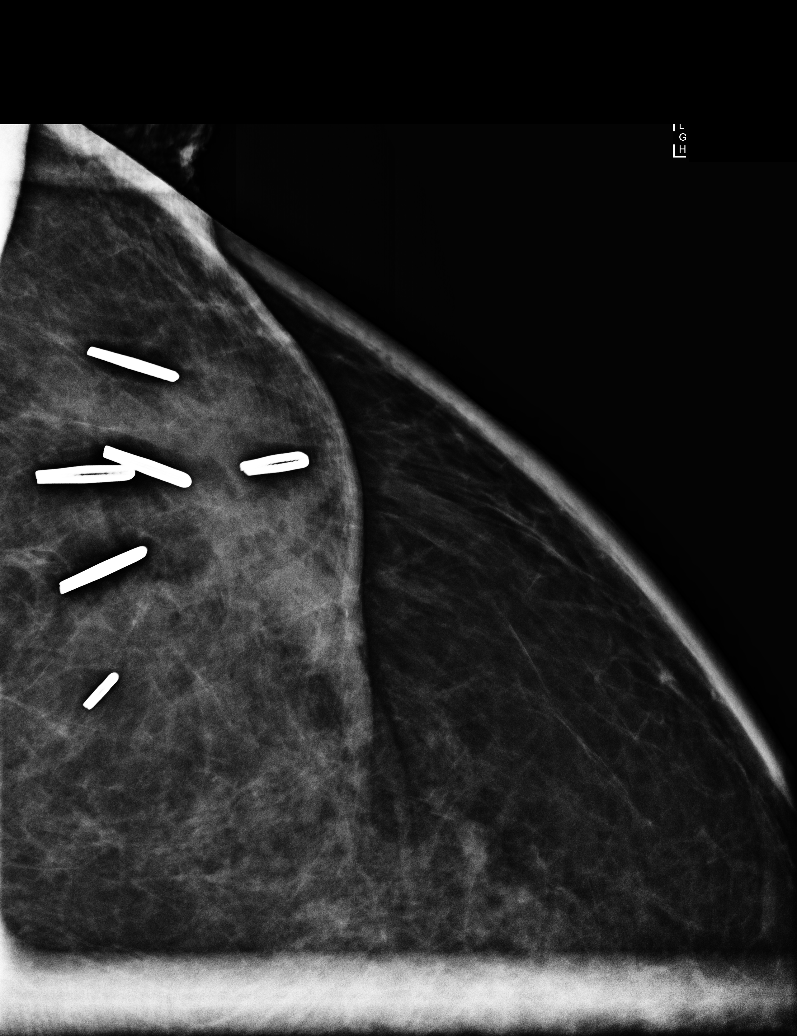

[R CC]
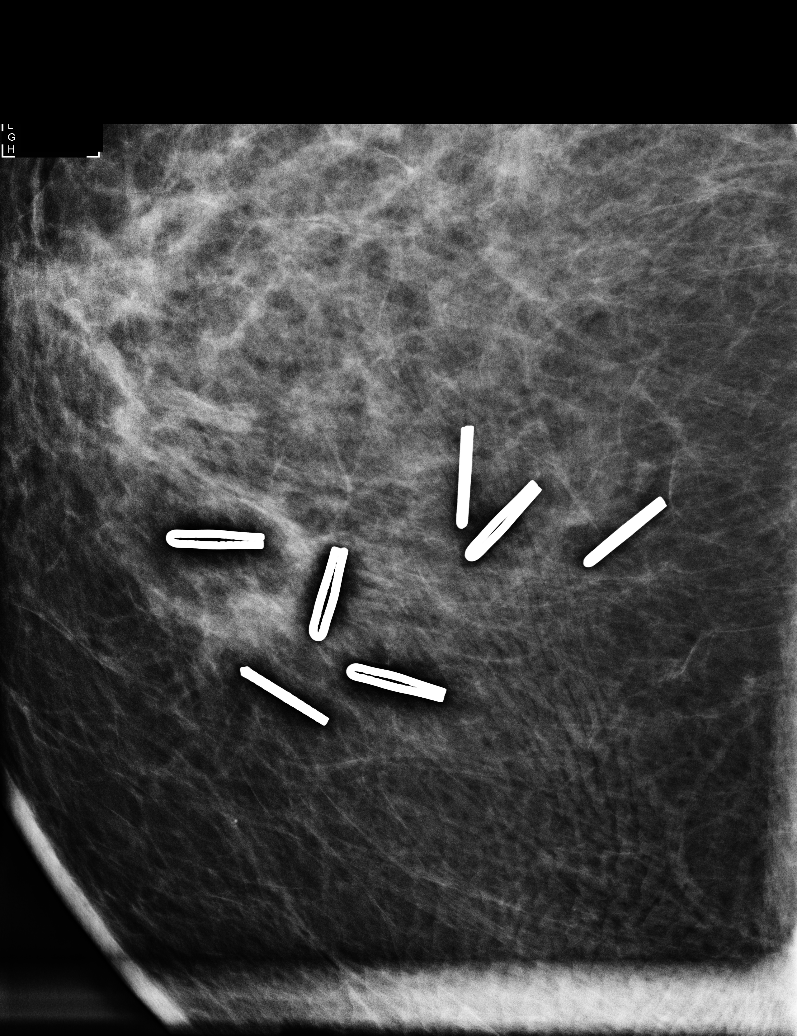

[R CC synth-2D]
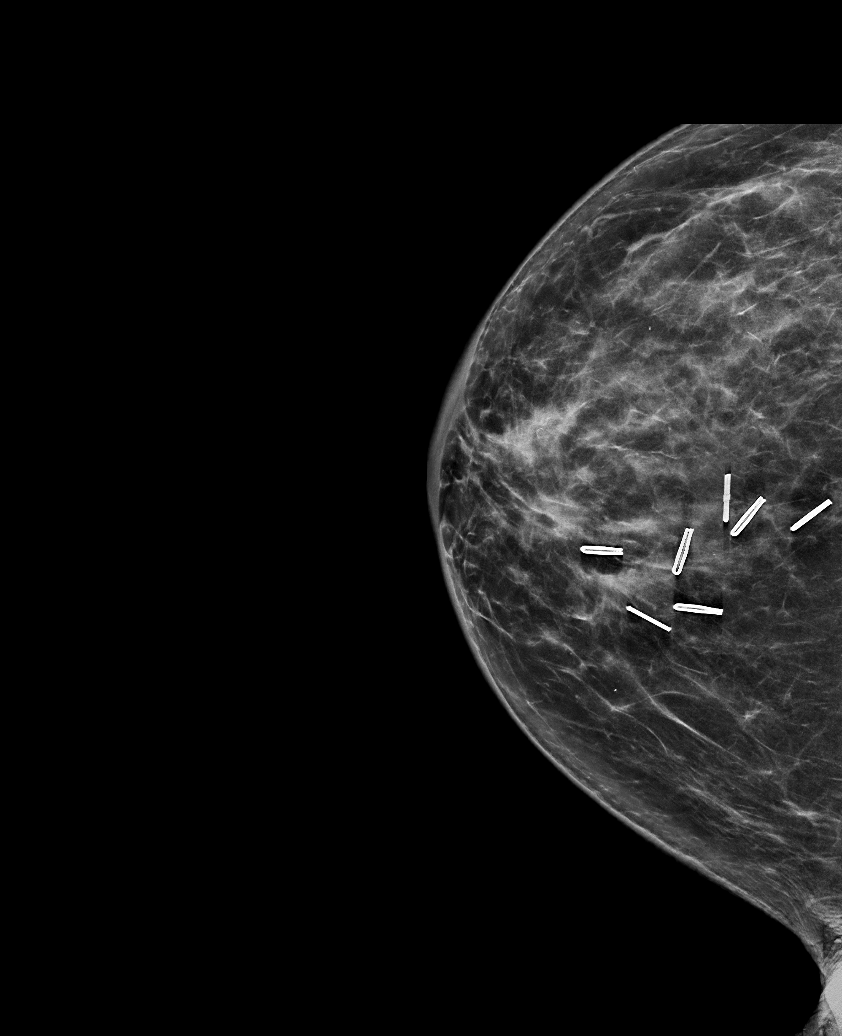

[R MLO synth-2D]
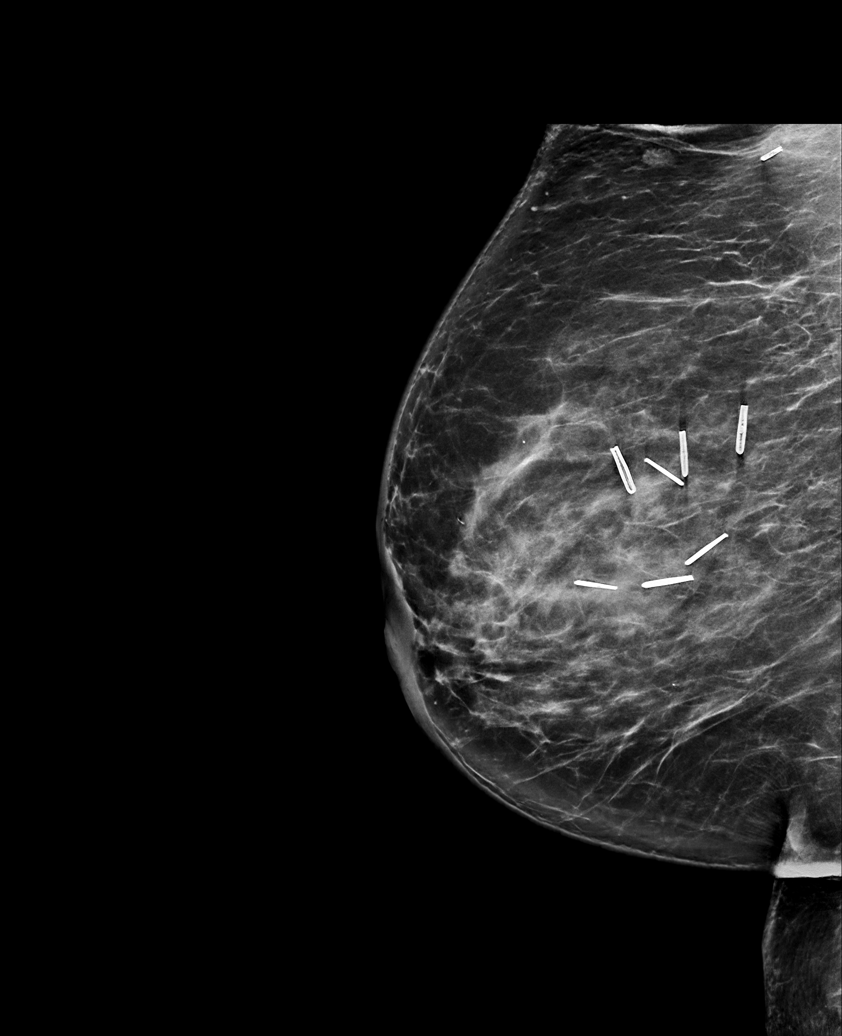

[L MLO synth-2D]
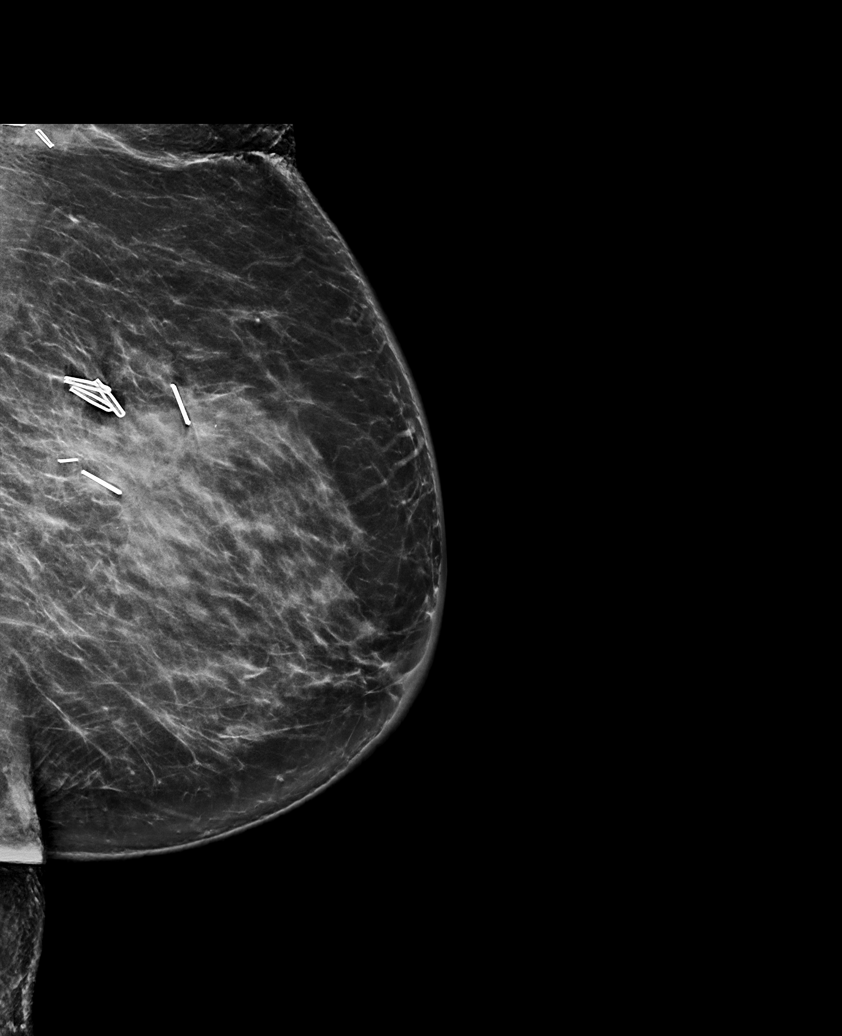

[L CC synth-2D]
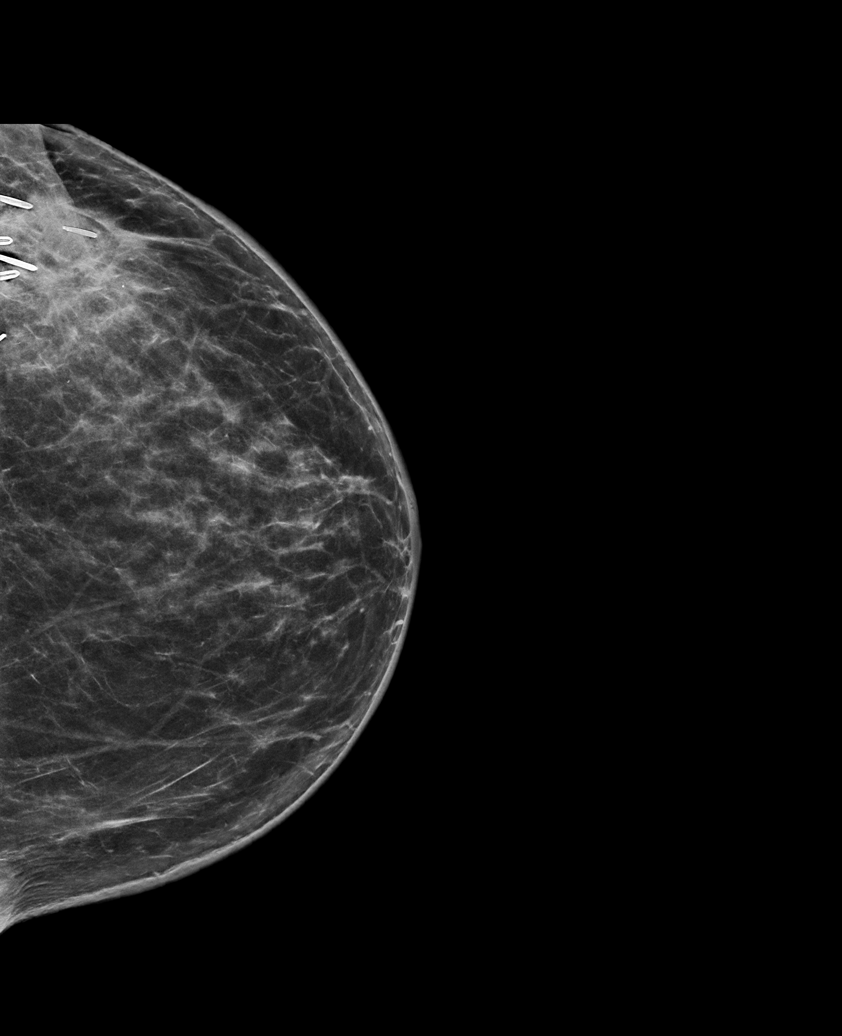

[6 of 26 positions shown; findings below may reference images not displayed]

ACR Breast Density Category c: The breast tissue is heterogeneously
dense, which may obscure small masses.
FINDINGS: There are expected postsurgical changes within each breast. There
are no new dominant masses, suspicious calcifications or secondary
signs of malignancy identified within either breast.

Mammographic images were processed with CAD.
IMPRESSION: No evidence of malignancy within either breast. Expected
postsurgical changes within each breast.

RECOMMENDATION:
Bilateral diagnostic mammogram in 1 year.

I have discussed the findings and recommendations with the patient.
Results were also provided in writing at the conclusion of the
visit. If applicable, a reminder letter will be sent to the patient
regarding the next appointment.

BI-RADS CATEGORY  2: Benign.

## 2019-04-12 DIAGNOSIS — M160 Bilateral primary osteoarthritis of hip: Secondary | ICD-10-CM | POA: Diagnosis not present

## 2019-04-12 DIAGNOSIS — Z96643 Presence of artificial hip joint, bilateral: Secondary | ICD-10-CM | POA: Diagnosis not present

## 2019-04-25 DIAGNOSIS — H02001 Unspecified entropion of right upper eyelid: Secondary | ICD-10-CM | POA: Diagnosis not present

## 2019-04-25 DIAGNOSIS — H02004 Unspecified entropion of left upper eyelid: Secondary | ICD-10-CM | POA: Diagnosis not present

## 2019-04-25 NOTE — Progress Notes (Signed)
[place]   [phone] [phone]   Clinic Follow up Note   Patient Care Team: [name], MD as PCP - General (Internal Medicine) [name], MD as Consulting Physician (General Surgery) Eppie Gibson, MD as Attending Physician (Radiation Oncology) Truitt Merle, MD as Consulting Physician (Hematology) Delice Bison Charlestine Massed, NP as Nurse Practitioner (Hematology and Oncology)   I connected with Julie Sosa on 04/29/2019 at  9:20 AM EST by telephone visit and verified that I am speaking with the correct person using two identifiers.  I discussed the limitations, risks, security and privacy concerns of performing an evaluation and management service by telephone and the availability of in person appointments. I also discussed with the patient that there may be a patient responsible charge related to this service. The patient expressed understanding and agreed to proceed.   Other persons participating in the visit and their role in the encounter: Her husband   Patient's location:  Her home Provider's location:  My office   CHIEF COMPLAINT: F/u of Bilateral Breast Cancer  SUMMARY OF ONCOLOGIC HISTORY: Oncology History Overview Note  Cancer Staging Bilateral breast cancer ([place]) Staging form: Breast, AJCC 8th Edition - Clinical: No stage assigned - Unsigned  Breast cancer of lower-outer quadrant of left female breast ([place]) Staging form: Breast, AJCC 8th Edition - Pathologic stage from 10/15/2016: Stage IA (pT1b, pN0, cM0, G1, ER: Positive, PR: Positive, HER2: Negative) - Signed by Truitt Merle, MD on 10/23/2016  Breast cancer of upper-inner quadrant of right female breast [facility]) Staging form: Breast, AJCC 8th Edition - Pathologic stage from 10/15/2016: Stage IA (pT1a, pN0, cM0, G1, ER: Positive, PR: Positive, HER2: Negative) - Signed by Truitt Merle, MD on 10/23/2016     Bilateral breast cancer ([place])  08/14/2016 Mammogram   IMPRESSION: Further  evaluation is suggested for possible distortion in the right breast.  Further evaluation is suggested for possible distortion in the left breast.   08/26/2016 Mammogram   IMPRESSION: 1. There is a suspicious mass in the right breast at 12:30, which is favored to correspond with the distortion identified mammographically.  2.  There is a suspicious mass in the right breast at 1:30.  3. There is an indeterminate elongated mass in the right breast at 12 o'clock.  4. There is an irregular mass in the left breast at 3:30 which is indeterminate. This could be related to the patient's prior surgery, however malignancy cannot be excluded.  5.  No evidence of bilateral lymphadenopathy.   09/09/2016 Pathology Results   Diagnosis 1. Breast, right, needle core biopsy, 12:30 4 cm fn INVASIVE DUCTAL CARCINOMA, GRADE 1 DUCTAL CARCINOMA IN SITU IS PRESENT 2. Breast, right, needle core biopsy, 1:30 3 cm fn FIBROADENOMA 3. Breast, left, needle core biopsy, 3:30 6 cm fn INVASIVE DUCTAL CARCINOMA, GRADE 1   09/09/2016 Initial Diagnosis   Bilateral breast cancer ([place])   09/09/2016 Receptors her2   Left breast cancer ER 100%+, PR 100%+, HER2-, Ki67 10% Right breast cancer ER 100%+, PR 90%+, HER2-, Ki67 2%   10/07/2016 Pathology Results   Diagnosis Breast, right, needle core biopsy, 12:00 o'clock - FIBROADENOMA. - THERE IS NO EVIDENCE OF MALIGNANCY. - SEE COMMENT.   10/15/2016 Pathology Results   Diagnosis 1. Breast, lumpectomy, Right w/ bracketed seed - INVASIVE DUCTAL CARCINOMA, 0.3 CM, MSBR GRADE 1. - MARGINS NOT INVOLVED. - TWO FIBROADENOMAS WITH CALCIFICATIONS. - PREVIOUS BIOPSY SITES AND CLIPS. 2. Breast, lumpectomy, Left w/seed - INVASIVE DUCTAL CARCINOMA, 1 CM,  MSBR GRADE 1. - DUCTAL CARCINOMA IN SITU. - MARGINS NOT INVOLVED. - CLOSEST MARGIN INFERIOR AT 0.15 CM. - PREVIOUS BIOPSY SITE AND CLIPS. 3. Lymph node, sentinel, biopsy, Left axillary #1 - ONE BENIGN LYMPH NODE  (0/1). 4. Lymph node, sentinel, biopsy, Left axillary #2 - ONE BENIGN LYMPH NODE (0/1). 5. Lymph node, sentinel, biopsy, Left axillary #3 - BENIGN ADIPOSE TISSUE. - NO LYMPH NODE TISSUE OR MALIGNANCY. 6. Lymph node, sentinel, biopsy, Right axillary #1 - ONE BENIGN LYMPH NODE (0/1). 7. Lymph node, sentinel, biopsy, Right axillary 32 - ONE BENIGN LYMPH NODE (0/1). 8. Lymph node, sentinel, biopsy, Right axillary - ONE BENIGN LYMPH NODE (0/1) 9. Lymph node, sentinel, biopsy, Right axillary - ONE BENIGN LYMPH NODE (0/1). 10. Lymph node, sentinel, biopsy, Right axillary - ONE BENIGN LYMPH NODE (0/1).   10/15/2016 Surgery   Patient recived a bilateral breast lumpectomy performed by Dr. Barry Dienes   11/19/2016 - 12/11/2016 Radiation Therapy   The left breast was treated to 42.56 Gy in 16 fractions of 2.66 Gy. The right breast was treated to 42.56 Gy in 16 fractions of 2.66 Gy.     01/2017 -  Anti-estrogen oral therapy   Anastrozole daily   Breast cancer of lower-outer quadrant of left female breast ([place])  10/23/2016 Initial Diagnosis   Breast cancer of lower-outer quadrant of left female breast ([place])   Breast cancer of upper-inner quadrant of right female breast ([place])  10/23/2016 Initial Diagnosis   Breast cancer of upper-inner quadrant of right female breast ([place])      CURRENT THERAPY:  Anastrozole 2.5 mg daily started on10/1/18  INTERVAL HISTORY:  Julie Sosa is here for a follow up of b/l breast cancer. She was last seen by me 6 months ago. They identified themselves by birth date. She notes she is doing well. She denies any issues from Anastrozole. She has occasional joint pain. She notes her appetite is adequate but her energy is down. She notes she is not getting much exercise. She notes she can still take care of herself and help her husband as well.  She notes she current has shingles with pain of upper shoulder and behind ear.    REVIEW OF SYSTEMS:   Constitutional:  Denies fevers, chills or abnormal weight loss (+) Lowered energy Eyes: Denies blurriness of vision Ears, nose, mouth, throat, and face: Denies mucositis or sore throat Respiratory: Denies cough, dyspnea or wheezes Cardiovascular: Denies palpitation, chest discomfort or lower extremity swelling Gastrointestinal:  Denies nausea, heartburn or change in bowel habits Skin: Denies abnormal skin rashes Lymphatics: Denies new lymphadenopathy or easy bruising Neurological:Denies numbness, tingling or new weaknesses (+) Shingles pain of shoulder and behind ear Behavioral/Psych: Mood is stable, no new changes  All other systems were reviewed with the patient and are negative.  MEDICAL HISTORY:  Past Medical History:  Diagnosis Date  . Anemia   . Anxiety   . Arthritis    osteoarthritis  . Breast cancer ([place])    bilat 2018  . Cancer ([place])    melanoma right arm  . Depression   . Family history of breast cancer   . Family history of lung cancer   . Gallstone   . GERD (gastroesophageal reflux disease)    occasionally uses tums or rolaids  . Headache   . Hypercholesteremia   . Hypertension   . Personal history of radiation therapy   . Restless leg syndrome   . UTI (urinary tract infection)  SURGICAL HISTORY: Past Surgical History:  Procedure Laterality Date  . ABDOMINAL HYSTERECTOMY    . BREAST BIOPSY    . BREAST CYST EXCISION    . BREAST LUMPECTOMY Bilateral    2018  . BREAST LUMPECTOMY WITH RADIOACTIVE SEED AND SENTINEL LYMPH NODE BIOPSY Bilateral 10/15/2016   Procedure: RIGHT BREAST LUMPECTOMY WITH BRACKETED RADIOACTIVE SEEDS AND RIGHT SENTINEL LYMPH NODE BIOPSY, LEFT BREAST LUMPECTOMY WITH RADIOACTIVE SEED AND LEFT SENTINEL LYMPH NODE BIOPSY;  Surgeon: [name], MD;  Location: [facility];  Service: General;  Laterality: Bilateral;  . BREAST SURGERY     left breast cyst removed  . CATARACT EXTRACTION W/PHACO  03/11/2012   Procedure: CATARACT EXTRACTION PHACO AND INTRAOCULAR LENS  PLACEMENT (IOC);  Surgeon: Tonny Branch, MD;  Location: [facility];  Service: Ophthalmology;  Laterality: Right;  CDE:17.71  . CATARACT EXTRACTION W/PHACO  03/29/2012   Procedure: CATARACT EXTRACTION PHACO AND INTRAOCULAR LENS PLACEMENT (IOC);  Surgeon: Tonny Branch, MD;  Location: [facility];  Service: Ophthalmology;  Laterality: Left;  CDE:17.49  . CHOLECYSTECTOMY  10 yrs ago   Arnoldo Morale  . COLONOSCOPY     before 2009  . EYE SURGERY Bilateral    cataract extraction  . MUSCLE BIOPSY     removal of cyst-right calf  . TOTAL HIP ARTHROPLASTY Left 12/25/2014   Procedure: TOTAL HIP ARTHROPLASTY;  Surgeon: Dereck Leep, MD;  Location: [facility];  Service: Orthopedics;  Laterality: Left;  . TOTAL HIP ARTHROPLASTY Right 06/23/2016   Procedure: TOTAL HIP ARTHROPLASTY;  Surgeon: Dereck Leep, MD;  Location: [facility];  Service: Orthopedics;  Laterality: Right;  . YAG LASER APPLICATION Right 0/11/6759   Procedure: YAG LASER APPLICATION;  Surgeon: Williams Che, MD;  Location: [facility];  Service: Ophthalmology;  Laterality: Right;  . YAG LASER APPLICATION Left 9/50/9326   Procedure: YAG LASER APPLICATION;  Surgeon: Williams Che, MD;  Location: [facility];  Service: Ophthalmology;  Laterality: Left;    I have reviewed the social history and family history with the patient and they are unchanged from previous note.  ALLERGIES:  is allergic to cefuroxime.  MEDICATIONS:  Current Outpatient Medications  Medication Sig Dispense Refill  . AMBULATORY NON FORMULARY MEDICATION Medication Name: Nitroglycerin ointment 0.125 mg apply a pea size amount rectally three times a day for 6-8 weeks. 1 Tube 1  . anastrozole (ARIMIDEX) 1 MG tablet TAKE 1 TABLET BY MOUTH DAILY 90 tablet 0  . atenolol (TENORMIN) 50 MG tablet Take 50 mg by mouth daily. In am.    . fluticasone (FLONASE) 50 MCG/ACT nasal spray 2 sprays daily.    . hydrochlorothiazide (HYDRODIURIL) 25 MG tablet Take 25 mg by mouth daily. In am.    . omeprazole  (PRILOSEC) 40 MG capsule TAKE 1 CAPSULE(40 MG) BY MOUTH DAILY 30 capsule 3  . polyethylene glycol (MIRALAX / GLYCOLAX) packet Take 17 g by mouth daily.    . potassium chloride SA (K-DUR,KLOR-CON) 20 MEQ tablet Take 20-40 mEq by mouth 2 (two) times daily. 40 MEQ in the morning & 20 MEQ at night after supper    . rOPINIRole (REQUIP) 1 MG tablet Take 1 mg by mouth daily at 8 pm.     . sertraline (ZOLOFT) 100 MG tablet Take 100 mg by mouth daily. In am.    . simvastatin (ZOCOR) 20 MG tablet Take 20 mg by mouth daily. In am.    . traZODone (DESYREL) 100 MG tablet Take 100 mg by mouth at bedtime.  2   No current facility-administered medications for this visit.    PHYSICAL EXAMINATION: ECOG PERFORMANCE STATUS: 1 - Symptomatic but completely ambulatory  No vitals taken today, Exam not performed today   LABORATORY DATA:  I have reviewed the data as listed CBC Latest Ref Rng & Units 10/28/2018 04/26/2018 01/25/2018  WBC 4.0 - 10.5 K/uL 4.9 4.6 6.6  Hemoglobin 12.0 - 15.0 g/dL 11.7(L) 11.4(L) 12.1  Hematocrit 36.0 - 46.0 % 35.1(L) 34.8(L) 36.8  Platelets 150 - 400 K/uL 156 143(L) 199     CMP Latest Ref Rng & Units 10/28/2018 04/26/2018 01/25/2018  Glucose 70 - 99 mg/dL 97 114(H) 110(H)  BUN 8 - 23 mg/dL 11 9 5(L)  Creatinine 0.44 - 1.00 mg/dL 0.98 0.96 0.87  Sodium 135 - 145 mmol/L 136 139 136  Potassium 3.5 - 5.1 mmol/L 3.9 3.7 3.4(L)  Chloride 98 - 111 mmol/L 100 102 98  CO2 22 - 32 mmol/L _0 Calcium 8.9 - 10.3 mg/dL 9.8 9.4 9.3  Total Protein 6.5 - 8.1 g/dL 7.0 6.7 7.1  Total Bilirubin 0.3 - 1.2 mg/dL 0.9 0.8 1.0  Alkaline Phos 38 - 126 U/L 70 62 65  AST 15 - 41 U/L 18 13(L) 20  ALT 0 - 44 U/L _1 RADIOGRAPHIC STUDIES: I have personally reviewed the radiological images as listed and agreed with the findings in the report. No results found.   ASSESSMENT & PLAN:  Julie Sosa is a 84 y.o. female with    1. Bilateral Breast Cancer, upper-outer quadrant of  left breast, pT1bN0M0, stage IA, and upper inner quadrant of right breast, pT1aN0M0, stage IA, both invasive ductal carcinoma, grade 1, ER and PR strongly positive, HER-2 negative -She was diagnosed in 6-10/2016. She is s/p b/l lumpectomy and adjuvant radiation. Severiano Gilbert early stage, with tumor <1.0cm, low-grade, I think her risk of recurrence is very low given the strongly ER/PR positive and HER-2 negative disease, I do not think we need Oncotype. No adjuvant chemotherapy is needed. -She started antiestrogen therapy with Anastrozole in 01/2017. Tolerating well with no issues.  -She is clinically doing well. She has no breast issues or concerns currently. Her 10/2018 mammogram was unremarkable.  -Continue surveillance. Next mammogram in 10/2019 -Continue Anastrozole -F/u in 6 months  -She and her husband received first COVID19 Vaccine last week and will proceed with second in weeks to come. I encouraged them to continue precautions.   2. Genetic Testing was negative  3. Bone health  -11/07/16 DEXA was normal (-0.5). Her 01/10/19 DEXA was normal and stable (-0.6). I reviewed with her -Will monitor with DEXA scan every 2 years as she is on AI.    4. Acid Reflux, Hiatal Hernia -Her Acid Reflux isprobablysecondary to her hiatal hernia  -04/2018 Upper endoscopy showed hermildpresbyesophagus.Her pathology report showed Barrett's Esophagus and benign squamous mucosa -She is on Prilosec or Protonix and Pepcid. She will continue to follow up with Dr. Tarri Glenn  5.DNR/DNI -She brought her living will paperwork documenting her DNR/DNI.I will leave a copy in her chart  6. Shingles  -She currently has shingles with pain of her shoulder to behind her ear. This is manageable.   PLAN -She is clinically doing well  -Continue anastrozole -Lab and f/u in 6 months -mammogram in July     No problem-specific Assessment & Plan notes found for this encounter.   Orders Placed This Encounter    Procedures  . MM  DIAG BREAST TOMO BILATERAL    Standing Status:   Future    Standing Expiration Date:   04/28/2020    Order Specific Question:   Reason for Exam (SYMPTOM  OR DIAGNOSIS REQUIRED)    Answer:   screening    Order Specific Question:   Preferred imaging location?    Answer:   [facility]   I discussed the assessment and treatment plan with the patient. The patient was provided an opportunity to ask questions and all were answered. The patient agreed with the plan and demonstrated an understanding of the instructions.  The patient was advised to call back or seek an in-person evaluation if the symptoms worsen or if the condition fails to improve as anticipated.  The total time spent in the appointment was 15 minutes.    Truitt Merle, MD 04/29/2019   I, Joslyn Devon, am acting as scribe for Truitt Merle, MD.   I have reviewed the above documentation for accuracy and completeness, and I agree with the above.

## 2019-04-29 ENCOUNTER — Encounter: Payer: Self-pay | Admitting: Hematology

## 2019-04-29 ENCOUNTER — Telehealth: Payer: Self-pay | Admitting: Hematology

## 2019-04-29 ENCOUNTER — Inpatient Hospital Stay: Payer: PPO | Attending: Hematology | Admitting: Hematology

## 2019-04-29 DIAGNOSIS — C50812 Malignant neoplasm of overlapping sites of left female breast: Secondary | ICD-10-CM

## 2019-04-29 DIAGNOSIS — Z170 Estrogen receptor positive status [ER+]: Secondary | ICD-10-CM

## 2019-04-29 DIAGNOSIS — C50811 Malignant neoplasm of overlapping sites of right female breast: Secondary | ICD-10-CM

## 2019-04-29 NOTE — Telephone Encounter (Signed)
Appts already scheduled per 12/2 los °

## 2019-05-10 ENCOUNTER — Other Ambulatory Visit: Payer: Self-pay | Admitting: Gastroenterology

## 2019-05-30 DIAGNOSIS — I10 Essential (primary) hypertension: Secondary | ICD-10-CM | POA: Diagnosis not present

## 2019-05-30 DIAGNOSIS — B0229 Other postherpetic nervous system involvement: Secondary | ICD-10-CM | POA: Diagnosis not present

## 2019-06-08 ENCOUNTER — Other Ambulatory Visit: Payer: Self-pay

## 2019-06-08 ENCOUNTER — Other Ambulatory Visit: Payer: Self-pay | Admitting: Hematology

## 2019-06-08 MED ORDER — ANASTROZOLE 1 MG PO TABS: 1.0000 mg | ORAL_TABLET | Freq: Every day | ORAL | 3 refills | Status: DC

## 2019-06-20 DIAGNOSIS — H02051 Trichiasis without entropian right upper eyelid: Secondary | ICD-10-CM | POA: Diagnosis not present

## 2019-08-18 ENCOUNTER — Encounter: Payer: Self-pay | Admitting: *Deleted

## 2019-08-22 DIAGNOSIS — R7301 Impaired fasting glucose: Secondary | ICD-10-CM | POA: Diagnosis not present

## 2019-08-23 ENCOUNTER — Encounter: Payer: Self-pay | Admitting: Dermatology

## 2019-08-23 ENCOUNTER — Other Ambulatory Visit: Payer: Self-pay

## 2019-08-23 ENCOUNTER — Ambulatory Visit: Payer: Medicare HMO | Admitting: Dermatology

## 2019-08-23 DIAGNOSIS — D1801 Hemangioma of skin and subcutaneous tissue: Secondary | ICD-10-CM

## 2019-08-23 DIAGNOSIS — L309 Dermatitis, unspecified: Secondary | ICD-10-CM | POA: Diagnosis not present

## 2019-08-23 DIAGNOSIS — L570 Actinic keratosis: Secondary | ICD-10-CM

## 2019-08-23 DIAGNOSIS — Z1283 Encounter for screening for malignant neoplasm of skin: Secondary | ICD-10-CM

## 2019-08-23 NOTE — Patient Instructions (Addendum)
General skin examination for [place]. [name] shows no recurrence right arm, no new atypical moles, no skin cancer.  On her lower legs are some white sharply defined scaly spots which are likely porokeratosis with low risk.  Small actinic keratoses on the top of the left hand are historically stable and not bothersome and can be watched.  Multiple little red dots called cherry angiomas do not require removal.  Julie Sosa will check her own skin twice a year and we will continue to look her all over once annually unless she sees something that needs to be seen sooner.

## 2019-08-23 NOTE — Progress Notes (Signed)
   Follow-Up Visit   Subjective  Julie Sosa is a 84 y.o. female who presents for the following: Annual Exam (Here this afternoon for her yearly look over. Nothing stands out that concerns her.).  Itchy spot left upper arm Location:  Duration:  Quality:  Associated Signs/Symptoms: Modifying Factors:  Severity:  Timing: Context:   The following portions of the chart were reviewed this encounter and updated as appropriate: Tobacco  Allergies  Meds  Problems  Med Hx  Surg Hx  Fam Hx      Objective  Well appearing patient in no apparent distress; mood and affect are within normal limits.  All sun exposed areas plus back examined.   Assessment & Plan  Dermatitis Left Upper Arm - Anterior  Reassured her that this is not contagious and not cancer; may use over-the-counter antipruritic lotions with pramoxine  General skin examination for [place]. [place] shows no recurrence right arm, no new atypical moles, no skin cancer.  On her lower legs are some white sharply defined scaly spots which are likely porokeratosis with low risk.  Small actinic keratoses on the top of the left hand are historically stable and not bothersome and can be watched.  Multiple little red dots called cherry angiomas do not require removal.  Julie Sosa will check her own skin twice a year and we will continue to look her all over once annually unless she sees something that needs to be seen sooner.

## 2019-08-25 DIAGNOSIS — G4700 Insomnia, unspecified: Secondary | ICD-10-CM | POA: Diagnosis not present

## 2019-08-25 DIAGNOSIS — G2581 Restless legs syndrome: Secondary | ICD-10-CM | POA: Diagnosis not present

## 2019-08-25 DIAGNOSIS — E785 Hyperlipidemia, unspecified: Secondary | ICD-10-CM | POA: Diagnosis not present

## 2019-08-25 DIAGNOSIS — K5900 Constipation, unspecified: Secondary | ICD-10-CM | POA: Diagnosis not present

## 2019-08-25 DIAGNOSIS — B0223 Postherpetic polyneuropathy: Secondary | ICD-10-CM | POA: Diagnosis not present

## 2019-08-25 DIAGNOSIS — I10 Essential (primary) hypertension: Secondary | ICD-10-CM | POA: Diagnosis not present

## 2019-08-25 DIAGNOSIS — C50919 Malignant neoplasm of unspecified site of unspecified female breast: Secondary | ICD-10-CM | POA: Diagnosis not present

## 2019-08-25 DIAGNOSIS — R32 Unspecified urinary incontinence: Secondary | ICD-10-CM | POA: Diagnosis not present

## 2019-08-25 DIAGNOSIS — E669 Obesity, unspecified: Secondary | ICD-10-CM | POA: Diagnosis not present

## 2019-08-25 DIAGNOSIS — Z008 Encounter for other general examination: Secondary | ICD-10-CM | POA: Diagnosis not present

## 2019-08-25 DIAGNOSIS — R69 Illness, unspecified: Secondary | ICD-10-CM | POA: Diagnosis not present

## 2019-08-28 ENCOUNTER — Encounter: Payer: Self-pay | Admitting: Dermatology

## 2019-08-29 DIAGNOSIS — I10 Essential (primary) hypertension: Secondary | ICD-10-CM | POA: Diagnosis not present

## 2019-08-29 DIAGNOSIS — R7303 Prediabetes: Secondary | ICD-10-CM | POA: Diagnosis not present

## 2019-08-29 DIAGNOSIS — R69 Illness, unspecified: Secondary | ICD-10-CM | POA: Diagnosis not present

## 2019-09-04 ENCOUNTER — Other Ambulatory Visit: Payer: Self-pay | Admitting: Gastroenterology

## 2019-09-29 ENCOUNTER — Other Ambulatory Visit: Payer: Self-pay | Admitting: Gastroenterology

## 2019-10-06 ENCOUNTER — Other Ambulatory Visit: Payer: Self-pay | Admitting: Gastroenterology

## 2019-10-06 NOTE — Telephone Encounter (Signed)
May have refill until time of follow-up visit. Thanks.  

## 2019-10-07 DIAGNOSIS — H02051 Trichiasis without entropian right upper eyelid: Secondary | ICD-10-CM | POA: Diagnosis not present

## 2019-10-13 ENCOUNTER — Ambulatory Visit
Admission: RE | Admit: 2019-10-13 | Discharge: 2019-10-13 | Disposition: A | Discharge status: Home / Self Care | Payer: Medicare HMO | Source: Ambulatory Visit | Attending: Hematology | Admitting: Hematology

## 2019-10-13 ENCOUNTER — Other Ambulatory Visit: Payer: Self-pay

## 2019-10-13 DIAGNOSIS — R928 Other abnormal and inconclusive findings on diagnostic imaging of breast: Secondary | ICD-10-CM | POA: Diagnosis not present

## 2019-10-13 DIAGNOSIS — C50811 Malignant neoplasm of overlapping sites of right female breast: Secondary | ICD-10-CM

## 2019-10-19 DIAGNOSIS — C50211 Malignant neoplasm of upper-inner quadrant of right female breast: Secondary | ICD-10-CM | POA: Diagnosis not present

## 2019-10-19 DIAGNOSIS — C50512 Malignant neoplasm of lower-outer quadrant of left female breast: Secondary | ICD-10-CM | POA: Diagnosis not present

## 2019-10-24 NOTE — Progress Notes (Signed)
[place]   [phone] [phone]   Clinic Follow up Note   Patient Care Team: [name], MD as PCP - General (Internal Medicine) [name], MD as Consulting Physician (General Surgery) Eppie Gibson, MD as Attending Physician (Radiation Oncology) Truitt Merle, MD as Consulting Physician (Hematology) Delice Bison Charlestine Massed, NP as Nurse Practitioner (Hematology and Oncology)  Date of Service:  10/28/2019  CHIEF COMPLAINT: F/u of Bilateral Breast Cancer  SUMMARY OF ONCOLOGIC HISTORY: Oncology History Overview Note  Cancer Staging Bilateral breast cancer [facility]) Staging form: Breast, AJCC 8th Edition - Clinical: No stage assigned - Unsigned  Breast cancer of lower-outer quadrant of left female breast [facility]) Staging form: Breast, AJCC 8th Edition - Pathologic stage from 10/15/2016: Stage IA (pT1b, pN0, cM0, G1, ER: Positive, PR: Positive, HER2: Negative) - Signed by Truitt Merle, MD on 10/23/2016  Breast cancer of upper-inner quadrant of right female breast [facility]) Staging form: Breast, AJCC 8th Edition - Pathologic stage from 10/15/2016: Stage IA (pT1a, pN0, cM0, G1, ER: Positive, PR: Positive, HER2: Negative) - Signed by Truitt Merle, MD on 10/23/2016     Bilateral breast cancer ([place])  08/14/2016 Mammogram   IMPRESSION: Further evaluation is suggested for possible distortion in the right breast.  Further evaluation is suggested for possible distortion in the left breast.   08/26/2016 Mammogram   IMPRESSION: 1. There is a suspicious mass in the right breast at 12:30, which is favored to correspond with the distortion identified mammographically.  2.  There is a suspicious mass in the right breast at 1:30.  3. There is an indeterminate elongated mass in the right breast at 12 o'clock.  4. There is an irregular mass in the left breast at 3:30 which is indeterminate. This could be related to the patient's prior surgery, however malignancy  cannot be excluded.  5.  No evidence of bilateral lymphadenopathy.   09/09/2016 Pathology Results   Diagnosis 1. Breast, right, needle core biopsy, 12:30 4 cm fn INVASIVE DUCTAL CARCINOMA, GRADE 1 DUCTAL CARCINOMA IN SITU IS PRESENT 2. Breast, right, needle core biopsy, 1:30 3 cm fn FIBROADENOMA 3. Breast, left, needle core biopsy, 3:30 6 cm fn INVASIVE DUCTAL CARCINOMA, GRADE 1   09/09/2016 Initial Diagnosis   Bilateral breast cancer ([place])   09/09/2016 Receptors her2   Left breast cancer ER 100%+, PR 100%+, HER2-, Ki67 10% Right breast cancer ER 100%+, PR 90%+, HER2-, Ki67 2%   10/07/2016 Pathology Results   Diagnosis Breast, right, needle core biopsy, 12:00 o'clock - FIBROADENOMA. - THERE IS NO EVIDENCE OF MALIGNANCY. - SEE COMMENT.   10/15/2016 Pathology Results   Diagnosis 1. Breast, lumpectomy, Right w/ bracketed seed - INVASIVE DUCTAL CARCINOMA, 0.3 CM, MSBR GRADE 1. - MARGINS NOT INVOLVED. - TWO FIBROADENOMAS WITH CALCIFICATIONS. - PREVIOUS BIOPSY SITES AND CLIPS. 2. Breast, lumpectomy, Left w/seed - INVASIVE DUCTAL CARCINOMA, 1 CM, MSBR GRADE 1. - DUCTAL CARCINOMA IN SITU. - MARGINS NOT INVOLVED. - CLOSEST MARGIN INFERIOR AT 0.15 CM. - PREVIOUS BIOPSY SITE AND CLIPS. 3. Lymph node, sentinel, biopsy, Left axillary #1 - ONE BENIGN LYMPH NODE (0/1). 4. Lymph node, sentinel, biopsy, Left axillary #2 - ONE BENIGN LYMPH NODE (0/1). 5. Lymph node, sentinel, biopsy, Left axillary #3 - BENIGN ADIPOSE TISSUE. - NO LYMPH NODE TISSUE OR MALIGNANCY. 6. Lymph node, sentinel, biopsy, Right axillary #1 - ONE BENIGN LYMPH NODE (0/1). 7. Lymph node, sentinel, biopsy, Right axillary 32 - ONE BENIGN LYMPH NODE (0/1). 8. Lymph node, sentinel, biopsy, Right  axillary - ONE BENIGN LYMPH NODE (0/1) 9. Lymph node, sentinel, biopsy, Right axillary - ONE BENIGN LYMPH NODE (0/1). 10. Lymph node, sentinel, biopsy, Right axillary - ONE BENIGN LYMPH NODE (0/1).   10/15/2016 Surgery    Patient recived a bilateral breast lumpectomy performed by Dr. Barry Dienes   11/19/2016 - 12/11/2016 Radiation Therapy   The left breast was treated to 42.56 Gy in 16 fractions of 2.66 Gy. The right breast was treated to 42.56 Gy in 16 fractions of 2.66 Gy.     01/2017 -  Anti-estrogen oral therapy   Anastrozole daily   Breast cancer of lower-outer quadrant of left female breast ([place])  10/23/2016 Initial Diagnosis   Breast cancer of lower-outer quadrant of left female breast ([place])   Breast cancer of upper-inner quadrant of right female breast ([place])  10/23/2016 Initial Diagnosis   Breast cancer of upper-inner quadrant of right female breast ([place])      CURRENT THERAPY:  Anastrozole 2.5 mg daily started on10/1/18  INTERVAL HISTORY:  Julie Sosa is here for a follow up of B/l breast cancer. She was last seen by me 6 months ago. She presents to the clinic alone. She notes she is doing well. She notes she tries to be as active as she can. She is able to do her daily activities and will get help from husband as needed. She lives across the street from her cousin. She note she is doing well on Anastrozole without any issues. Her baseline arthritis is stable.    REVIEW OF SYSTEMS:   Constitutional: Denies fevers, chills or abnormal weight loss Eyes: Denies blurriness of vision Ears, nose, mouth, throat, and face: Denies mucositis or sore throat Respiratory: Denies cough, dyspnea or wheezes Cardiovascular: Denies palpitation, chest discomfort or lower extremity swelling Gastrointestinal:  Denies nausea, heartburn or change in bowel habits Skin: Denies abnormal skin rashes MSK: (+) Arthritis  Lymphatics: Denies new lymphadenopathy or easy bruising Neurological:Denies numbness, tingling or new weaknesses Behavioral/Psych: Mood is stable, no new changes  All other systems were reviewed with the patient and are negative.  MEDICAL HISTORY:  Past Medical History:  Diagnosis Date  . Anemia     . Anxiety   . Arthritis    osteoarthritis  . Breast cancer ([place])    bilat 2018  . Cancer ([place])    melanoma right arm  . Depression   . Family history of breast cancer   . Family history of lung cancer   . Gallstone   . GERD (gastroesophageal reflux disease)    occasionally uses tums or rolaids  . Headache   . Hypercholesteremia   . Hypertension   . Melanoma ([place]) 11/13/2010   in situ right forearm- exc  . Personal history of radiation therapy   . Restless leg syndrome   . UTI (urinary tract infection)     SURGICAL HISTORY: Past Surgical History:  Procedure Laterality Date  . ABDOMINAL HYSTERECTOMY    . BREAST BIOPSY    . BREAST CYST EXCISION    . BREAST LUMPECTOMY Bilateral    2018  . BREAST LUMPECTOMY WITH RADIOACTIVE SEED AND SENTINEL LYMPH NODE BIOPSY Bilateral 10/15/2016   Procedure: RIGHT BREAST LUMPECTOMY WITH BRACKETED RADIOACTIVE SEEDS AND RIGHT SENTINEL LYMPH NODE BIOPSY, LEFT BREAST LUMPECTOMY WITH RADIOACTIVE SEED AND LEFT SENTINEL LYMPH NODE BIOPSY;  Surgeon: [name], MD;  Location: [facility];  Service: General;  Laterality: Bilateral;  . BREAST SURGERY     left breast cyst removed  . CATARACT  EXTRACTION W/PHACO  03/11/2012   Procedure: CATARACT EXTRACTION PHACO AND INTRAOCULAR LENS PLACEMENT (IOC);  Surgeon: Tonny Branch, MD;  Location: [facility];  Service: Ophthalmology;  Laterality: Right;  CDE:17.71  . CATARACT EXTRACTION W/PHACO  03/29/2012   Procedure: CATARACT EXTRACTION PHACO AND INTRAOCULAR LENS PLACEMENT (IOC);  Surgeon: Tonny Branch, MD;  Location: [facility];  Service: Ophthalmology;  Laterality: Left;  CDE:17.49  . CHOLECYSTECTOMY  10 yrs ago   Arnoldo Morale  . COLONOSCOPY     before 2009  . EYE SURGERY Bilateral    cataract extraction  . MUSCLE BIOPSY     removal of cyst-right calf  . TOTAL HIP ARTHROPLASTY Left 12/25/2014   Procedure: TOTAL HIP ARTHROPLASTY;  Surgeon: Dereck Leep, MD;  Location: [facility];  Service: Orthopedics;  Laterality: Left;  .  TOTAL HIP ARTHROPLASTY Right 06/23/2016   Procedure: TOTAL HIP ARTHROPLASTY;  Surgeon: Dereck Leep, MD;  Location: [facility];  Service: Orthopedics;  Laterality: Right;  . YAG LASER APPLICATION Right 09/11/3708   Procedure: YAG LASER APPLICATION;  Surgeon: Williams Che, MD;  Location: [facility];  Service: Ophthalmology;  Laterality: Right;  . YAG LASER APPLICATION Left 10/01/9483   Procedure: YAG LASER APPLICATION;  Surgeon: Williams Che, MD;  Location: [facility];  Service: Ophthalmology;  Laterality: Left;    I have reviewed the social history and family history with the patient and they are unchanged from previous note.  ALLERGIES:  is allergic to cefuroxime.  MEDICATIONS:  Current Outpatient Medications  Medication Sig Dispense Refill  . AMBULATORY NON FORMULARY MEDICATION Medication Name: Nitroglycerin ointment 0.125 mg apply a pea size amount rectally three times a day for 6-8 weeks. 1 Tube 1  . anastrozole (ARIMIDEX) 1 MG tablet Take 1 tablet (1 mg total) by mouth daily. 90 tablet 3  . atenolol (TENORMIN) 50 MG tablet Take 50 mg by mouth daily. In am.    . fluorometholone (FML) 0.1 % ophthalmic suspension Place 0.1 mLs into the right eye 1 day or 1 dose.    . hydrochlorothiazide (HYDRODIURIL) 25 MG tablet Take 25 mg by mouth daily. In am.    . ofloxacin (OCUFLOX) 0.3 % ophthalmic solution PLACE 1 GTT OD TID FOR 5 DAYS    . omeprazole (PRILOSEC) 40 MG capsule TAKE 1 CAPSULE BY MOUTH DAILY. PLEASE SCHEDULE AN APPT FOR ANY FURTHER REFILLS. THANK YOU. 90 capsule 1  . potassium chloride SA (K-DUR,KLOR-CON) 20 MEQ tablet Take 20-40 mEq by mouth 2 (two) times daily. 40 MEQ in the morning & 20 MEQ at night after supper    . rOPINIRole (REQUIP) 1 MG tablet Take 1 mg by mouth daily at 8 pm.     . sertraline (ZOLOFT) 100 MG tablet Take 100 mg by mouth daily. In am.    . simvastatin (ZOCOR) 20 MG tablet Take 20 mg by mouth daily. In am.    . traZODone (DESYREL) 100 MG tablet Take 100 mg by mouth  at bedtime.   2  . fluticasone (FLONASE) 50 MCG/ACT nasal spray 2 sprays daily.    Marland Kitchen gabapentin (NEURONTIN) 100 MG capsule     . Potassium Chloride ER 20 MEQ TBCR TAKE 2 TABLETS BY MOUTH EVERY MORNING AND 1 TABLET EVERY EVENING     No current facility-administered medications for this visit.    PHYSICAL EXAMINATION: ECOG PERFORMANCE STATUS: 0 - Asymptomatic  Vitals:   10/28/19 1045  BP: 117/80  Pulse: 57  Resp: 18  Temp: (!) 97.5 F (  36.4 C)  SpO2: 98%   Filed Weights   10/28/19 1045  Weight: 165 lb 8 oz (75.1 kg)    GENERAL:alert, no distress and comfortable SKIN: skin color, texture, turgor are normal, no rashes or significant lesions EYES: normal, Conjunctiva are pink and non-injected, sclera clear  NECK: supple, thyroid normal size, non-tender, without nodularity LYMPH:  no palpable lymphadenopathy in the cervical, axillary  LUNGS: clear to auscultation and percussion with normal breathing effort HEART: regular rate & rhythm and no murmurs and no lower extremity edema ABDOMEN:abdomen soft, non-tender and normal bowel sounds Musculoskeletal:no cyanosis of digits and no clubbing  NEURO: alert & oriented x 3 with fluent speech, no focal motor/sensory deficits BREAST: S/p b/l lumpectomy: Surgical incision healed well with 1.5cm scar tissue at lateral left breast 3:00 position and 1.5x3cm scar tissue at 2:00 position of right breast.    LABORATORY DATA:  I have reviewed the data as listed CBC Latest Ref Rng & Units 10/28/2019 10/28/2018 04/26/2018  WBC 4.0 - 10.5 K/uL 5.4 4.9 4.6  Hemoglobin 12.0 - 15.0 g/dL 11.5(L) 11.7(L) 11.4(L)  Hematocrit 36 - 46 % 35.0(L) 35.1(L) 34.8(L)  Platelets 150 - 400 K/uL 144(L) 156 143(L)     CMP Latest Ref Rng & Units 10/28/2019 10/28/2018 04/26/2018  Glucose 70 - 99 mg/dL 101(H) 97 114(H)  BUN 8 - 23 mg/dL '10 11 9  ' Creatinine 0.44 - 1.00 mg/dL 1.00 0.98 0.96  Sodium 135 - 145 mmol/L 140 136 139  Potassium 3.5 - 5.1 mmol/L 4.1 3.9 3.7   Chloride 98 - 111 mmol/L 102 100 102  CO2 22 - 32 mmol/L '28 28 29  ' Calcium 8.9 - 10.3 mg/dL 9.8 9.8 9.4  Total Protein 6.5 - 8.1 g/dL 6.6 7.0 6.7  Total Bilirubin 0.3 - 1.2 mg/dL 0.8 0.9 0.8  Alkaline Phos 38 - 126 U/L 69 70 62  AST 15 - 41 U/L 15 18 13(L)  ALT 0 - 44 U/L '16 19 15      ' RADIOGRAPHIC STUDIES: I have personally reviewed the radiological images as listed and agreed with the findings in the report. No results found.   ASSESSMENT & PLAN:  Julie Sosa is a 84 y.o. female with    1. Bilateral Breast Cancer, upper-outer quadrant of left breast, pT1bN0M0, stage IA, and upper inner quadrant of right breast, pT1aN0M0, stage IA, both invasive ductal carcinoma, grade 1, ER and PR strongly positive, HER-2 negative -She was diagnosed in 6-10/2016. She is s/p b/l lumpectomy and adjuvant radiation. Severiano Gilbert early stage, with tumor <1.0cm, low-grade, I think her risk of recurrence isvery lowgiven the strongly ER/PR positive and HER-2 negative disease, I do not think we need Oncotype. No adjuvant chemotherapy is needed. -She started antiestrogen therapy with Anastrozole in 01/2017. Tolerating well with no issues.  -She is clinically doing well. Lab reviewed, her CBC and CMP are within normal limits except stable Hg 11.5, plt 144K, BG 101. Her physical exam and her 10/2019 mammogram were unremarkable except b/l scar tissue from surgery. There is no clinical concern for recurrence. -Continue surveillance. Next mammogram in 10/2020 -Continue Anastrozole -F/u in 6 months with NP Lacie    2. GeneticTesting was negative  3. Bone health  -11/07/16 DEXA was normal (-0.5). Her 01/10/19 DEXA was normal and stable (-0.6). I reviewed with her -Will monitor with DEXA scan every 2 years as she is on AI.    4. Acid Reflux, Hiatal Hernia -Her Acid Reflux isprobablysecondary to her hiatal hernia -  04/2018 Upper endoscopyshowed hermildpresbyesophagus.Her pathology report showed  Barrett's Esophagusand benign squamous mucosa -She is on Prilosec or Protonix and Pepcid. She will continue to follow up with Dr. Tarri Glenn -Not mentioned today.   5.DNR/DNI -She previously brought her living will paperwork documenting her DNR/DNI.  PLAN -She is clinically doing well -Continue anastrozole -Lab and f/u with NP Lacie in 6 months   No problem-specific Assessment & Plan notes found for this encounter.   No orders of the defined types were placed in this encounter.  All questions were answered. The patient knows to call the clinic with any problems, questions or concerns. No barriers to learning was detected. The total time spent in the appointment was 25 minutes.     Truitt Merle, MD 10/28/2019   I, Joslyn Devon, am acting as scribe for Truitt Merle, MD.   I have reviewed the above documentation for accuracy and completeness, and I agree with the above.

## 2019-10-25 DIAGNOSIS — H10411 Chronic giant papillary conjunctivitis, right eye: Secondary | ICD-10-CM | POA: Diagnosis not present

## 2019-10-28 ENCOUNTER — Inpatient Hospital Stay: Payer: Medicare HMO | Attending: Hematology | Admitting: Hematology

## 2019-10-28 ENCOUNTER — Inpatient Hospital Stay: Payer: Medicare HMO

## 2019-10-28 ENCOUNTER — Other Ambulatory Visit: Payer: Self-pay

## 2019-10-28 ENCOUNTER — Encounter: Payer: Self-pay | Admitting: Hematology

## 2019-10-28 ENCOUNTER — Telehealth: Payer: Self-pay | Admitting: Hematology

## 2019-10-28 VITALS — BP 117/80 | HR 57 | Temp 97.5°F | Resp 18 | Ht 61.5 in | Wt 165.5 lb

## 2019-10-28 DIAGNOSIS — C50812 Malignant neoplasm of overlapping sites of left female breast: Secondary | ICD-10-CM | POA: Diagnosis not present

## 2019-10-28 DIAGNOSIS — Z66 Do not resuscitate: Secondary | ICD-10-CM | POA: Insufficient documentation

## 2019-10-28 DIAGNOSIS — K219 Gastro-esophageal reflux disease without esophagitis: Secondary | ICD-10-CM | POA: Diagnosis not present

## 2019-10-28 DIAGNOSIS — C50811 Malignant neoplasm of overlapping sites of right female breast: Secondary | ICD-10-CM | POA: Diagnosis not present

## 2019-10-28 DIAGNOSIS — C50211 Malignant neoplasm of upper-inner quadrant of right female breast: Secondary | ICD-10-CM | POA: Insufficient documentation

## 2019-10-28 DIAGNOSIS — K469 Unspecified abdominal hernia without obstruction or gangrene: Secondary | ICD-10-CM | POA: Diagnosis not present

## 2019-10-28 DIAGNOSIS — C50412 Malignant neoplasm of upper-outer quadrant of left female breast: Secondary | ICD-10-CM | POA: Diagnosis not present

## 2019-10-28 DIAGNOSIS — Z170 Estrogen receptor positive status [ER+]: Secondary | ICD-10-CM | POA: Diagnosis not present

## 2019-10-28 LAB — CBC WITH DIFFERENTIAL (CANCER CENTER ONLY)
Abs Immature Granulocytes: 0.02 10*3/uL (ref 0.00–0.07)
Basophils Absolute: 0.1 10*3/uL (ref 0.0–0.1)
Basophils Relative: 1 %
Eosinophils Absolute: 0.1 10*3/uL (ref 0.0–0.5)
Eosinophils Relative: 1 %
HCT: 35 % — ABNORMAL LOW (ref 36.0–46.0)
Hemoglobin: 11.5 g/dL — ABNORMAL LOW (ref 12.0–15.0)
Immature Granulocytes: 0 %
Lymphocytes Relative: 14 %
Lymphs Abs: 0.8 10*3/uL (ref 0.7–4.0)
MCH: 29 pg (ref 26.0–34.0)
MCHC: 32.9 g/dL (ref 30.0–36.0)
MCV: 88.4 fL (ref 80.0–100.0)
Monocytes Absolute: 0.4 10*3/uL (ref 0.1–1.0)
Monocytes Relative: 8 %
Neutro Abs: 4.1 10*3/uL (ref 1.7–7.7)
Neutrophils Relative %: 76 %
Platelet Count: 144 10*3/uL — ABNORMAL LOW (ref 150–400)
RBC: 3.96 MIL/uL (ref 3.87–5.11)
RDW: 12.5 % (ref 11.5–15.5)
WBC Count: 5.4 10*3/uL (ref 4.0–10.5)
nRBC: 0 % (ref 0.0–0.2)

## 2019-10-28 LAB — CMP (CANCER CENTER ONLY)
ALT: 16 U/L (ref 0–44)
AST: 15 U/L (ref 15–41)
Albumin: 3.8 g/dL (ref 3.5–5.0)
Alkaline Phosphatase: 69 U/L (ref 38–126)
Anion gap: 10 (ref 5–15)
BUN: 10 mg/dL (ref 8–23)
CO2: 28 mmol/L (ref 22–32)
Calcium: 9.8 mg/dL (ref 8.9–10.3)
Chloride: 102 mmol/L (ref 98–111)
Creatinine: 1 mg/dL (ref 0.44–1.00)
GFR, Est AFR Am: >60 mL/min (ref >60)
GFR, Estimated: 53 mL/min — ABNORMAL LOW (ref >60)
Glucose, Bld: 101 mg/dL — ABNORMAL HIGH (ref 70–99)
Potassium: 4.1 mmol/L (ref 3.5–5.1)
Sodium: 140 mmol/L (ref 135–145)
Total Bilirubin: 0.8 mg/dL (ref 0.3–1.2)
Total Protein: 6.6 g/dL (ref 6.5–8.1)

## 2019-10-28 NOTE — Telephone Encounter (Signed)
Scheduled per 7/23 los. Printed avs and calendar for pt.  

## 2019-10-29 ENCOUNTER — Encounter: Payer: Self-pay | Admitting: Hematology

## 2019-11-28 DIAGNOSIS — H02051 Trichiasis without entropian right upper eyelid: Secondary | ICD-10-CM | POA: Diagnosis not present

## 2020-01-09 DIAGNOSIS — H02051 Trichiasis without entropian right upper eyelid: Secondary | ICD-10-CM | POA: Diagnosis not present

## 2020-01-26 DIAGNOSIS — I10 Essential (primary) hypertension: Secondary | ICD-10-CM | POA: Diagnosis not present

## 2020-01-26 DIAGNOSIS — N183 Chronic kidney disease, stage 3 unspecified: Secondary | ICD-10-CM | POA: Diagnosis not present

## 2020-01-26 DIAGNOSIS — Z79899 Other long term (current) drug therapy: Secondary | ICD-10-CM | POA: Diagnosis not present

## 2020-01-26 DIAGNOSIS — E039 Hypothyroidism, unspecified: Secondary | ICD-10-CM | POA: Diagnosis not present

## 2020-01-26 DIAGNOSIS — C50919 Malignant neoplasm of unspecified site of unspecified female breast: Secondary | ICD-10-CM | POA: Diagnosis not present

## 2020-01-26 DIAGNOSIS — E785 Hyperlipidemia, unspecified: Secondary | ICD-10-CM | POA: Diagnosis not present

## 2020-01-26 DIAGNOSIS — E1129 Type 2 diabetes mellitus with other diabetic kidney complication: Secondary | ICD-10-CM | POA: Diagnosis not present

## 2020-01-26 DIAGNOSIS — E876 Hypokalemia: Secondary | ICD-10-CM | POA: Diagnosis not present

## 2020-02-02 DIAGNOSIS — E785 Hyperlipidemia, unspecified: Secondary | ICD-10-CM | POA: Diagnosis not present

## 2020-02-02 DIAGNOSIS — R079 Chest pain, unspecified: Secondary | ICD-10-CM | POA: Diagnosis not present

## 2020-02-02 DIAGNOSIS — Z853 Personal history of malignant neoplasm of breast: Secondary | ICD-10-CM | POA: Diagnosis not present

## 2020-02-02 DIAGNOSIS — Z23 Encounter for immunization: Secondary | ICD-10-CM | POA: Diagnosis not present

## 2020-02-02 DIAGNOSIS — Z0001 Encounter for general adult medical examination with abnormal findings: Secondary | ICD-10-CM | POA: Diagnosis not present

## 2020-02-02 DIAGNOSIS — E1122 Type 2 diabetes mellitus with diabetic chronic kidney disease: Secondary | ICD-10-CM | POA: Diagnosis not present

## 2020-02-02 DIAGNOSIS — N1831 Chronic kidney disease, stage 3a: Secondary | ICD-10-CM | POA: Diagnosis not present

## 2020-02-02 DIAGNOSIS — Z6831 Body mass index (BMI) 31.0-31.9, adult: Secondary | ICD-10-CM | POA: Diagnosis not present

## 2020-02-02 DIAGNOSIS — I10 Essential (primary) hypertension: Secondary | ICD-10-CM | POA: Diagnosis not present

## 2020-02-03 DIAGNOSIS — H02051 Trichiasis without entropian right upper eyelid: Secondary | ICD-10-CM | POA: Diagnosis not present

## 2020-04-13 ENCOUNTER — Other Ambulatory Visit: Payer: Self-pay | Admitting: Gastroenterology

## 2020-04-13 NOTE — Telephone Encounter (Signed)
Needs office visit prior to additional refills.  

## 2020-04-29 NOTE — Progress Notes (Signed)
[place]   [phone] [phone]   Clinic Follow up Note   Patient Care Team: [name], MD as PCP - General (Internal Medicine) [name], MD as Consulting Physician (General Surgery) Eppie Gibson, MD as Attending Physician (Radiation Oncology) Truitt Merle, MD as Consulting Physician (Hematology) Gardenia Phlegm, NP as Nurse Practitioner (Hematology and Oncology) 04/30/2020  CHIEF COMPLAINT: Follow up bilateral breast cancer   SUMMARY OF ONCOLOGIC HISTORY: Oncology History Overview Note  Cancer Staging Bilateral breast cancer [facility]) Staging form: Breast, AJCC 8th Edition - Clinical: No stage assigned - Unsigned  Breast cancer of lower-outer quadrant of left female breast [facility]) Staging form: Breast, AJCC 8th Edition - Pathologic stage from 10/15/2016: Stage IA (pT1b, pN0, cM0, G1, ER: Positive, PR: Positive, HER2: Negative) - Signed by Truitt Merle, MD on 10/23/2016  Breast cancer of upper-inner quadrant of right female breast [facility]) Staging form: Breast, AJCC 8th Edition - Pathologic stage from 10/15/2016: Stage IA (pT1a, pN0, cM0, G1, ER: Positive, PR: Positive, HER2: Negative) - Signed by Truitt Merle, MD on 10/23/2016     Bilateral breast cancer ([place])  08/14/2016 Mammogram   IMPRESSION: Further evaluation is suggested for possible distortion in the right breast.  Further evaluation is suggested for possible distortion in the left breast.   08/26/2016 Mammogram   IMPRESSION: 1. There is a suspicious mass in the right breast at 12:30, which is favored to correspond with the distortion identified mammographically.  2.  There is a suspicious mass in the right breast at 1:30.  3. There is an indeterminate elongated mass in the right breast at 12 o'clock.  4. There is an irregular mass in the left breast at 3:30 which is indeterminate. This could be related to the patient's prior surgery, however malignancy cannot be  excluded.  5.  No evidence of bilateral lymphadenopathy.   09/09/2016 Pathology Results   Diagnosis 1. Breast, right, needle core biopsy, 12:30 4 cm fn INVASIVE DUCTAL CARCINOMA, GRADE 1 DUCTAL CARCINOMA IN SITU IS PRESENT 2. Breast, right, needle core biopsy, 1:30 3 cm fn FIBROADENOMA 3. Breast, left, needle core biopsy, 3:30 6 cm fn INVASIVE DUCTAL CARCINOMA, GRADE 1   09/09/2016 Initial Diagnosis   Bilateral breast cancer ([place])   09/09/2016 Receptors her2   Left breast cancer ER 100%+, PR 100%+, HER2-, Ki67 10% Right breast cancer ER 100%+, PR 90%+, HER2-, Ki67 2%   10/07/2016 Pathology Results   Diagnosis Breast, right, needle core biopsy, 12:00 o'clock - FIBROADENOMA. - THERE IS NO EVIDENCE OF MALIGNANCY. - SEE COMMENT.   10/15/2016 Pathology Results   Diagnosis 1. Breast, lumpectomy, Right w/ bracketed seed - INVASIVE DUCTAL CARCINOMA, 0.3 CM, MSBR GRADE 1. - MARGINS NOT INVOLVED. - TWO FIBROADENOMAS WITH CALCIFICATIONS. - PREVIOUS BIOPSY SITES AND CLIPS. 2. Breast, lumpectomy, Left w/seed - INVASIVE DUCTAL CARCINOMA, 1 CM, MSBR GRADE 1. - DUCTAL CARCINOMA IN SITU. - MARGINS NOT INVOLVED. - CLOSEST MARGIN INFERIOR AT 0.15 CM. - PREVIOUS BIOPSY SITE AND CLIPS. 3. Lymph node, sentinel, biopsy, Left axillary #1 - ONE BENIGN LYMPH NODE (0/1). 4. Lymph node, sentinel, biopsy, Left axillary #2 - ONE BENIGN LYMPH NODE (0/1). 5. Lymph node, sentinel, biopsy, Left axillary #3 - BENIGN ADIPOSE TISSUE. - NO LYMPH NODE TISSUE OR MALIGNANCY. 6. Lymph node, sentinel, biopsy, Right axillary #1 - ONE BENIGN LYMPH NODE (0/1). 7. Lymph node, sentinel, biopsy, Right axillary 32 - ONE BENIGN LYMPH NODE (0/1). 8. Lymph node, sentinel, biopsy, Right axillary - ONE BENIGN  LYMPH NODE (0/1) 9. Lymph node, sentinel, biopsy, Right axillary - ONE BENIGN LYMPH NODE (0/1). 10. Lymph node, sentinel, biopsy, Right axillary - ONE BENIGN LYMPH NODE (0/1).   10/15/2016 Surgery   Patient recived  a bilateral breast lumpectomy performed by Dr. Barry Dienes   11/19/2016 - 12/11/2016 Radiation Therapy   The left breast was treated to 42.56 Gy in 16 fractions of 2.66 Gy. The right breast was treated to 42.56 Gy in 16 fractions of 2.66 Gy.     01/2017 -  Anti-estrogen oral therapy   Anastrozole daily   Breast cancer of lower-outer quadrant of left female breast ([place])  10/23/2016 Initial Diagnosis   Breast cancer of lower-outer quadrant of left female breast ([place])   Breast cancer of upper-inner quadrant of right female breast ([place])  10/23/2016 Initial Diagnosis   Breast cancer of upper-inner quadrant of right female breast ([place])     CURRENT THERAPY: Anastrozole 2.5 mg daily starting 01/05/2017  INTERVAL HISTORY: Ms. Cinco returns for follow up as scheduled. She was last seen 10/2019. Mammograms are done in July. She continues anastrozole. She has bilateral breast pain when she leans forward. Scar tissue size in right breast fluctuates. Denies new lump/mass. Arthritic pains are stable, denies new pain. Denies hot flash. She has stable cough for past few months, produces phelgm in the mornings. She attributes to allergies. Denies fever or chills. She is fatigued, snores at night and never gets a full nights sleep. Her daughter and sister use CPAPs. Eyes are sensitive, seeing a surgeon about possible lid lift soon. All other systems were reviewed with the patient and are negative.    MEDICAL HISTORY:  Past Medical History:  Diagnosis Date  . Anemia   . Anxiety   . Arthritis    osteoarthritis  . Breast cancer ([place])    bilat 2018  . Cancer ([place])    melanoma right arm  . Depression   . Family history of breast cancer   . Family history of lung cancer   . Gallstone   . GERD (gastroesophageal reflux disease)    occasionally uses tums or rolaids  . Headache   . Hypercholesteremia   . Hypertension   . Melanoma ([place]) 11/13/2010   in situ right forearm- exc  . Personal history of radiation  therapy   . Restless leg syndrome   . UTI (urinary tract infection)     SURGICAL HISTORY: Past Surgical History:  Procedure Laterality Date  . ABDOMINAL HYSTERECTOMY    . BREAST BIOPSY    . BREAST CYST EXCISION    . BREAST LUMPECTOMY Bilateral    2018  . BREAST LUMPECTOMY WITH RADIOACTIVE SEED AND SENTINEL LYMPH NODE BIOPSY Bilateral 10/15/2016   Procedure: RIGHT BREAST LUMPECTOMY WITH BRACKETED RADIOACTIVE SEEDS AND RIGHT SENTINEL LYMPH NODE BIOPSY, LEFT BREAST LUMPECTOMY WITH RADIOACTIVE SEED AND LEFT SENTINEL LYMPH NODE BIOPSY;  Surgeon: [name], MD;  Location: [facility];  Service: General;  Laterality: Bilateral;  . BREAST SURGERY     left breast cyst removed  . CATARACT EXTRACTION W/PHACO  03/11/2012   Procedure: CATARACT EXTRACTION PHACO AND INTRAOCULAR LENS PLACEMENT (IOC);  Surgeon: Tonny Branch, MD;  Location: [facility];  Service: Ophthalmology;  Laterality: Right;  CDE:17.71  . CATARACT EXTRACTION W/PHACO  03/29/2012   Procedure: CATARACT EXTRACTION PHACO AND INTRAOCULAR LENS PLACEMENT (IOC);  Surgeon: Tonny Branch, MD;  Location: [facility];  Service: Ophthalmology;  Laterality: Left;  CDE:17.49  . CHOLECYSTECTOMY  10 yrs ago   Arnoldo Morale  .  COLONOSCOPY     before 2009  . EYE SURGERY Bilateral    cataract extraction  . MUSCLE BIOPSY     removal of cyst-right calf  . TOTAL HIP ARTHROPLASTY Left 12/25/2014   Procedure: TOTAL HIP ARTHROPLASTY;  Surgeon: Dereck Leep, MD;  Location: [facility];  Service: Orthopedics;  Laterality: Left;  . TOTAL HIP ARTHROPLASTY Right 06/23/2016   Procedure: TOTAL HIP ARTHROPLASTY;  Surgeon: Dereck Leep, MD;  Location: [facility];  Service: Orthopedics;  Laterality: Right;  . YAG LASER APPLICATION Right 09/06/930   Procedure: YAG LASER APPLICATION;  Surgeon: Williams Che, MD;  [facility]: [facility];  Service: Ophthalmology;  Laterality: Right;  . YAG LASER APPLICATION Left 3/55/7322   Procedure: YAG LASER APPLICATION;  Surgeon: Williams Che, MD;   [facility]: [facility];  Service: Ophthalmology;  Laterality: Left;    I have reviewed the social history and family history with the patient and they are unchanged from previous note.  ALLERGIES:  is allergic to cefuroxime.  MEDICATIONS:  Current Outpatient Medications  Medication Sig Dispense Refill  . AMBULATORY NON FORMULARY MEDICATION Medication Name: Nitroglycerin ointment 0.125 mg apply a pea size amount rectally three times a day for 6-8 weeks. 1 Tube 1  . anastrozole (ARIMIDEX) 1 MG tablet Take 1 tablet (1 mg total) by mouth daily. 90 tablet 3  . atenolol (TENORMIN) 50 MG tablet Take 50 mg by mouth daily. In am.    . fluorometholone (FML) 0.1 % ophthalmic suspension Place 0.1 mLs into the right eye 1 day or 1 dose.    . fluticasone (FLONASE) 50 MCG/ACT nasal spray 2 sprays daily.    Marland Kitchen gabapentin (NEURONTIN) 100 MG capsule     . hydrochlorothiazide (HYDRODIURIL) 25 MG tablet Take 25 mg by mouth daily. In am.    . ofloxacin (OCUFLOX) 0.3 % ophthalmic solution PLACE 1 GTT OD TID FOR 5 DAYS    . omeprazole (PRILOSEC) 40 MG capsule TAKE 1 CAPSULE(40 MG) BY MOUTH DAILY 90 capsule 0  . Potassium Chloride ER 20 MEQ TBCR TAKE 2 TABLETS BY MOUTH EVERY MORNING AND 1 TABLET EVERY EVENING    . potassium chloride SA (K-DUR,KLOR-CON) 20 MEQ tablet Take 20-40 mEq by mouth 2 (two) times daily. 40 MEQ in the morning & 20 MEQ at night after supper    . rOPINIRole (REQUIP) 1 MG tablet Take 1 mg by mouth daily at 8 pm.     . sertraline (ZOLOFT) 100 MG tablet Take 100 mg by mouth daily. In am.    . simvastatin (ZOCOR) 20 MG tablet Take 20 mg by mouth daily. In am.    . traZODone (DESYREL) 100 MG tablet Take 100 mg by mouth at bedtime.   2   No current facility-administered medications for this visit.    PHYSICAL EXAMINATION: ECOG PERFORMANCE STATUS: 1 - Symptomatic but completely ambulatory  Vitals:   04/30/20 1110  BP: 137/80  Pulse: 65  Resp: 18  Temp: 97.6 F (36.4 C)  SpO2: 95%   Filed  Weights   04/30/20 1110  Weight: 169 lb (76.7 kg)    GENERAL:alert, no distress and comfortable SKIN: no rash  EYES: sclera clear LYMPH:  no palpable cervical or supraclavicular lymphadenopathy  LUNGS:  normal breathing effort HEART:  no lower extremity edema Musculoskeletal:no cyanosis of digits and no clubbing  NEURO: alert & oriented x 3 with fluent speech, no focal motor/sensory deficits Breast: symmetrical without nipple discharge or inversion. S/p bilateral lumpectomy. Incisions  completely healed. Scar tissue noted right lateral breast. What is likely scar tissue noted superior to right areolar incision 2: 00 position measures 2-3 cm. No other palpable mass in either breast or axilla that I could appreciate.    LABORATORY DATA:  I have reviewed the data as listed CBC Latest Ref Rng & Units 04/30/2020 10/28/2019 10/28/2018  WBC 4.0 - 10.5 K/uL 6.0 5.4 4.9  Hemoglobin 12.0 - 15.0 g/dL 12.5 11.5(L) 11.7(L)  Hematocrit 36.0 - 46.0 % 38.2 35.0(L) 35.1(L)  Platelets 150 - 400 K/uL 185 144(L) 156     CMP Latest Ref Rng & Units 04/30/2020 10/28/2019 10/28/2018  Glucose 70 - 99 mg/dL 96 101(H) 97  BUN 8 - 23 mg/dL '9 10 11  ' Creatinine 0.44 - 1.00 mg/dL 1.05(H) 1.00 0.98  Sodium 135 - 145 mmol/L 136 140 136  Potassium 3.5 - 5.1 mmol/L 3.6 4.1 3.9  Chloride 98 - 111 mmol/L 100 102 100  CO2 22 - 32 mmol/L '28 28 28  ' Calcium 8.9 - 10.3 mg/dL 9.5 9.8 9.8  Total Protein 6.5 - 8.1 g/dL 7.2 6.6 7.0  Total Bilirubin 0.3 - 1.2 mg/dL 0.9 0.8 0.9  Alkaline Phos 38 - 126 U/L 62 69 70  AST 15 - 41 U/L '20 15 18  ' ALT 0 - 44 U/L '19 16 19      ' RADIOGRAPHIC STUDIES: I have personally reviewed the radiological images as listed and agreed with the findings in the report. No results found.   ASSESSMENT & PLAN: 85 y.o.postmenopausal woman, presented with screening discovered bilateral breast cancer.   1. Bilateral Breast Cancer, upper-outer quadrant of left breast, pT1bN0M0, stage IA, and upper  inner quadrant of right breast, pT1aN0M0, stage IA, both invasive ductal carcinoma, grade 1, ER and PR strongly positive, HER-2 negative -diagnosed 10/2016 s/p bilateral lumpectomy, adjuvant RT, and AI starting 01/2017 2. Genetics - negative  3. Bone health - normal 01/2019, repeat 01/2021 4. Arthritis and b/l hip replacement - stable on AI 5. Depression - managed per PCP   Disposition:  Ms. Josephs is clinically doing well. She continues anastrozole, tolerating well with stable joint aches. Breast exam shows bilateral scar tissue, size fluctuates on right per patient. My measurement similar to 10/2019 breast exam. Otherwise benign. Labs normal except Scr 1.05. Mammogram in 10/2019 negative/normal.   There is no strong concern for recurrence, however I am recommending an interim right breast mammo/US to monitor for stability regarding the right breast scar tissue superior to the incision. Patient agrees. I will call her with results.   If work up is negative, resume annual bilateral mammograms in July. She is due for DEXA In 01/2021. This year she will be 4 years from initial diagnosis, we reviewed her recurrence risk has decreased. F/up in 6 months.   Her fatigue could be related to many things, including AI. I recommend sleep study to r/o OSA, she will contact her PCP.    Orders Placed This Encounter  Procedures  . MM DIAG BREAST TOMO UNI RIGHT    Standing Status:   Future    Standing Expiration Date:   04/30/2021    Order Specific Question:   Reason for Exam (SYMPTOM  OR DIAGNOSIS REQUIRED)    Answer:   density R breast superior to areolar incision 2 oclock. confirm this is scar tissue    Order Specific Question:   Preferred imaging location?    Answer:   [facility]  . US BREAST LTD UNI RIGHT INC  [place]    Standing Status:   Future    Standing Expiration Date:   04/30/2021    Order Specific Question:   Reason for Exam (SYMPTOM  OR DIAGNOSIS REQUIRED)    Answer:   density R breast  superior to areolar incision 2 oclock. confirm this is scar tissue    Order Specific Question:   Preferred imaging location?    Answer:   [facility]  . DG Bone Density    Standing Status:   Future    Standing Expiration Date:   04/30/2021    Order Specific Question:   Reason for Exam (SYMPTOM  OR DIAGNOSIS REQUIRED)    Answer:   on AI    Order Specific Question:   Preferred imaging location?    Answer:   [facility]   All questions were answered. The patient knows to call the clinic with any problems, questions or concerns. No barriers to learning were detected. Total encounter time was 30 minutes.      Alla Feeling, NP 04/30/20

## 2020-04-30 ENCOUNTER — Inpatient Hospital Stay: Payer: Medicare Other

## 2020-04-30 ENCOUNTER — Other Ambulatory Visit: Payer: Self-pay

## 2020-04-30 ENCOUNTER — Inpatient Hospital Stay: Payer: Medicare Other | Attending: Nurse Practitioner | Admitting: Nurse Practitioner

## 2020-04-30 ENCOUNTER — Encounter: Payer: Self-pay | Admitting: Nurse Practitioner

## 2020-04-30 VITALS — BP 137/80 | HR 65 | Temp 97.6°F | Resp 18 | Ht 61.5 in | Wt 169.0 lb

## 2020-04-30 DIAGNOSIS — C50812 Malignant neoplasm of overlapping sites of left female breast: Secondary | ICD-10-CM

## 2020-04-30 DIAGNOSIS — Z923 Personal history of irradiation: Secondary | ICD-10-CM | POA: Insufficient documentation

## 2020-04-30 DIAGNOSIS — C50811 Malignant neoplasm of overlapping sites of right female breast: Secondary | ICD-10-CM

## 2020-04-30 DIAGNOSIS — M1990 Unspecified osteoarthritis, unspecified site: Secondary | ICD-10-CM | POA: Diagnosis not present

## 2020-04-30 DIAGNOSIS — Z79811 Long term (current) use of aromatase inhibitors: Secondary | ICD-10-CM | POA: Insufficient documentation

## 2020-04-30 DIAGNOSIS — Z170 Estrogen receptor positive status [ER+]: Secondary | ICD-10-CM | POA: Diagnosis not present

## 2020-04-30 DIAGNOSIS — C50211 Malignant neoplasm of upper-inner quadrant of right female breast: Secondary | ICD-10-CM | POA: Diagnosis not present

## 2020-04-30 DIAGNOSIS — F32A Depression, unspecified: Secondary | ICD-10-CM | POA: Diagnosis not present

## 2020-04-30 DIAGNOSIS — C50412 Malignant neoplasm of upper-outer quadrant of left female breast: Secondary | ICD-10-CM | POA: Diagnosis not present

## 2020-04-30 DIAGNOSIS — Z96643 Presence of artificial hip joint, bilateral: Secondary | ICD-10-CM | POA: Diagnosis not present

## 2020-04-30 LAB — CBC WITH DIFFERENTIAL (CANCER CENTER ONLY)
Abs Immature Granulocytes: 0.02 10*3/uL (ref 0.00–0.07)
Basophils Absolute: 0.1 10*3/uL (ref 0.0–0.1)
Basophils Relative: 1 %
Eosinophils Absolute: 0.1 10*3/uL (ref 0.0–0.5)
Eosinophils Relative: 1 %
HCT: 38.2 % (ref 36.0–46.0)
Hemoglobin: 12.5 g/dL (ref 12.0–15.0)
Immature Granulocytes: 0 %
Lymphocytes Relative: 15 %
Lymphs Abs: 0.9 10*3/uL (ref 0.7–4.0)
MCH: 28.9 pg (ref 26.0–34.0)
MCHC: 32.7 g/dL (ref 30.0–36.0)
MCV: 88.4 fL (ref 80.0–100.0)
Monocytes Absolute: 0.5 10*3/uL (ref 0.1–1.0)
Monocytes Relative: 8 %
Neutro Abs: 4.5 10*3/uL (ref 1.7–7.7)
Neutrophils Relative %: 75 %
Platelet Count: 185 10*3/uL (ref 150–400)
RBC: 4.32 MIL/uL (ref 3.87–5.11)
RDW: 12.6 % (ref 11.5–15.5)
WBC Count: 6 10*3/uL (ref 4.0–10.5)
nRBC: 0 % (ref 0.0–0.2)

## 2020-04-30 LAB — CMP (CANCER CENTER ONLY)
ALT: 19 U/L (ref 0–44)
AST: 20 U/L (ref 15–41)
Albumin: 4.2 g/dL (ref 3.5–5.0)
Alkaline Phosphatase: 62 U/L (ref 38–126)
Anion gap: 8 (ref 5–15)
BUN: 9 mg/dL (ref 8–23)
CO2: 28 mmol/L (ref 22–32)
Calcium: 9.5 mg/dL (ref 8.9–10.3)
Chloride: 100 mmol/L (ref 98–111)
Creatinine: 1.05 mg/dL — ABNORMAL HIGH (ref 0.44–1.00)
GFR, Estimated: 53 mL/min — ABNORMAL LOW (ref >60)
Glucose, Bld: 96 mg/dL (ref 70–99)
Potassium: 3.6 mmol/L (ref 3.5–5.1)
Sodium: 136 mmol/L (ref 135–145)
Total Bilirubin: 0.9 mg/dL (ref 0.3–1.2)
Total Protein: 7.2 g/dL (ref 6.5–8.1)

## 2020-04-30 MED ORDER — ANASTROZOLE 1 MG PO TABS: 1.0000 mg | ORAL_TABLET | Freq: Every day | ORAL | 3 refills | Status: DC

## 2020-05-18 ENCOUNTER — Other Ambulatory Visit: Payer: Medicare Other

## 2020-05-29 DIAGNOSIS — I10 Essential (primary) hypertension: Secondary | ICD-10-CM | POA: Diagnosis not present

## 2020-05-29 DIAGNOSIS — E876 Hypokalemia: Secondary | ICD-10-CM | POA: Diagnosis not present

## 2020-05-29 DIAGNOSIS — E1129 Type 2 diabetes mellitus with other diabetic kidney complication: Secondary | ICD-10-CM | POA: Diagnosis not present

## 2020-05-29 DIAGNOSIS — Z79899 Other long term (current) drug therapy: Secondary | ICD-10-CM | POA: Diagnosis not present

## 2020-05-29 DIAGNOSIS — N183 Chronic kidney disease, stage 3 unspecified: Secondary | ICD-10-CM | POA: Diagnosis not present

## 2020-05-31 ENCOUNTER — Other Ambulatory Visit: Payer: Self-pay | Admitting: Nurse Practitioner

## 2020-05-31 ENCOUNTER — Ambulatory Visit
Admission: RE | Admit: 2020-05-31 | Discharge: 2020-05-31 | Disposition: A | Discharge status: Home / Self Care | Payer: Medicare Other | Source: Ambulatory Visit | Attending: Nurse Practitioner | Admitting: Nurse Practitioner

## 2020-05-31 ENCOUNTER — Other Ambulatory Visit: Payer: Self-pay

## 2020-05-31 DIAGNOSIS — C50811 Malignant neoplasm of overlapping sites of right female breast: Secondary | ICD-10-CM

## 2020-05-31 DIAGNOSIS — Z170 Estrogen receptor positive status [ER+]: Secondary | ICD-10-CM

## 2020-05-31 DIAGNOSIS — R922 Inconclusive mammogram: Secondary | ICD-10-CM | POA: Diagnosis not present

## 2020-05-31 DIAGNOSIS — N6310 Unspecified lump in the right breast, unspecified quadrant: Secondary | ICD-10-CM | POA: Diagnosis not present

## 2020-06-05 ENCOUNTER — Other Ambulatory Visit: Payer: Self-pay | Admitting: Hematology

## 2020-06-05 DIAGNOSIS — R7309 Other abnormal glucose: Secondary | ICD-10-CM | POA: Diagnosis not present

## 2020-06-05 DIAGNOSIS — E1122 Type 2 diabetes mellitus with diabetic chronic kidney disease: Secondary | ICD-10-CM | POA: Diagnosis not present

## 2020-06-05 DIAGNOSIS — I10 Essential (primary) hypertension: Secondary | ICD-10-CM | POA: Diagnosis not present

## 2020-06-06 ENCOUNTER — Other Ambulatory Visit: Payer: Self-pay

## 2020-06-06 ENCOUNTER — Ambulatory Visit
Admission: RE | Admit: 2020-06-06 | Discharge: 2020-06-06 | Disposition: A | Discharge status: Home / Self Care | Payer: Medicare Other | Source: Ambulatory Visit | Attending: Nurse Practitioner | Admitting: Nurse Practitioner

## 2020-06-06 DIAGNOSIS — C50811 Malignant neoplasm of overlapping sites of right female breast: Secondary | ICD-10-CM

## 2020-06-06 DIAGNOSIS — N6031 Fibrosclerosis of right breast: Secondary | ICD-10-CM | POA: Diagnosis not present

## 2020-06-06 DIAGNOSIS — N6312 Unspecified lump in the right breast, upper inner quadrant: Secondary | ICD-10-CM | POA: Diagnosis not present

## 2020-06-11 ENCOUNTER — Other Ambulatory Visit: Payer: Self-pay

## 2020-06-11 ENCOUNTER — Other Ambulatory Visit: Payer: Self-pay | Admitting: Gastroenterology

## 2020-06-11 DIAGNOSIS — C50812 Malignant neoplasm of overlapping sites of left female breast: Secondary | ICD-10-CM

## 2020-06-11 DIAGNOSIS — C50811 Malignant neoplasm of overlapping sites of right female breast: Secondary | ICD-10-CM

## 2020-06-11 MED ORDER — ANASTROZOLE 1 MG PO TABS: 1.0000 mg | ORAL_TABLET | Freq: Every day | ORAL | 3 refills | Status: AC

## 2020-06-13 ENCOUNTER — Other Ambulatory Visit: Payer: Self-pay | Admitting: Gastroenterology

## 2020-06-13 ENCOUNTER — Telehealth: Payer: Self-pay

## 2020-06-13 NOTE — Telephone Encounter (Signed)
Pt aware of most recent breast work up results instructions to continue anastrozole yearly check up to begin in July and follow up visit in 6 months

## 2020-06-13 NOTE — Telephone Encounter (Signed)
-----   Message from Alla Feeling, NP sent at 06/12/2020 10:03 AM EST ----- Please let her know I reviewed recent breast work up, mammo/US and biopsy which showed scar tissue, no cancer. We recommend to continue ananstrozole, resume annual mammograms in July, and see [place] in 6 months.   Let me know if she has questions/concerns. See how she is recovering from biopsy.   Thanks, Regan Rakers, NP

## 2020-06-28 ENCOUNTER — Ambulatory Visit: Payer: Medicare Other | Admitting: Gastroenterology

## 2020-06-29 DIAGNOSIS — H02834 Dermatochalasis of left upper eyelid: Secondary | ICD-10-CM | POA: Diagnosis not present

## 2020-06-29 DIAGNOSIS — H02413 Mechanical ptosis of bilateral eyelids: Secondary | ICD-10-CM | POA: Diagnosis not present

## 2020-06-29 DIAGNOSIS — H02031 Senile entropion of right upper eyelid: Secondary | ICD-10-CM | POA: Diagnosis not present

## 2020-06-29 DIAGNOSIS — H02021 Mechanical entropion of right upper eyelid: Secondary | ICD-10-CM | POA: Diagnosis not present

## 2020-06-29 DIAGNOSIS — H02024 Mechanical entropion of left upper eyelid: Secondary | ICD-10-CM | POA: Diagnosis not present

## 2020-06-29 DIAGNOSIS — H02051 Trichiasis without entropian right upper eyelid: Secondary | ICD-10-CM | POA: Diagnosis not present

## 2020-06-29 DIAGNOSIS — H02423 Myogenic ptosis of bilateral eyelids: Secondary | ICD-10-CM | POA: Diagnosis not present

## 2020-06-29 DIAGNOSIS — H02831 Dermatochalasis of right upper eyelid: Secondary | ICD-10-CM | POA: Diagnosis not present

## 2020-06-29 DIAGNOSIS — H02034 Senile entropion of left upper eyelid: Secondary | ICD-10-CM | POA: Diagnosis not present

## 2020-07-02 ENCOUNTER — Encounter: Payer: Self-pay | Admitting: Gastroenterology

## 2020-07-02 ENCOUNTER — Other Ambulatory Visit: Payer: Self-pay

## 2020-07-02 ENCOUNTER — Ambulatory Visit: Payer: Medicare Other | Admitting: Gastroenterology

## 2020-07-02 VITALS — BP 140/80 | HR 58 | Ht 61.0 in | Wt 166.0 lb

## 2020-07-02 DIAGNOSIS — K2270 Barrett's esophagus without dysplasia: Secondary | ICD-10-CM | POA: Diagnosis not present

## 2020-07-02 DIAGNOSIS — K5909 Other constipation: Secondary | ICD-10-CM | POA: Diagnosis not present

## 2020-07-02 DIAGNOSIS — K219 Gastro-esophageal reflux disease without esophagitis: Secondary | ICD-10-CM

## 2020-07-02 MED ORDER — OMEPRAZOLE 40 MG PO CPDR: 40.0000 mg | DELAYED_RELEASE_CAPSULE | Freq: Every day | ORAL | 2 refills | Status: DC

## 2020-07-02 NOTE — Progress Notes (Signed)
Referring Provider: [name], MD Primary Care Physician:  [name], MD   Chief complaint:  Anal fissure, trouble swallowing   IMPRESSION:  Short segment Barrett's Esophagus on EGD biopsies 04/23/18 GERD improved on daily PPI therapy History of dysphagia to solids x 10 years     - esophageal dysmotility suggested on UGI series    - UGI 03/15/2018:         - small to moderate sized sliding-type hiatal hernia with few episodes of reflux       - nonspecific esophageal motility disorder with occasional disruption of the primary peristaltic wave       - occasional tertiary contractions were noted       - muscular A and mucosal B rings which may explain the patient's dysphasia       - 13 mm barium passed into the stomach    - Presbyesophagus seen on EGD 04/23/2018 Chronic constipation with history of posterior anal fissure 03/09/18 History of hemorrhoids with intermittent pain, itching, and bleeding Small to moderate sliding hiatal hernia  Sigmoid diverticulosis with recent clinical diagnosis of diverticulitis by CT    - abdominal pain improved with antiobiotics Screening colonoscopy at [facility] 2006 No known family history of colon cancer or polyps  Short segment Barrett's Esophagus on EGD biopsies 04/23/18: Resume PPI. Plan surveillance EGD 04/2021.   Recurrent GERD symptoms since she stopped PPI: Refill script today. Reviewed lifestyle modifications. No alarm features.   Constipation: Discussed dietary strategies. Use Miralax to insure soft stools and avoid straining.   PLAN: Resume omeprazole 40 mg daily Resume daily Miralax recommended to avoid constipation and recurrent fissure Surveillance EGD 04/2021 for Barrett's Esophagus Return to clinic annually, earlier if needed  HPI: [place] Stegner is a 85 y.o. returns in follow-up. She was last seen 06/04/18.  The interval history is obtained through the patient and review of referral records.   She was diagnosed with  Barrett's esophagus without dysplasia at the time of an upper endoscopy for dysphasia 04/23/2018. Endoscopy showed mild presbyesophagus, medium sized hiatal hernia and otherwise normal mucosa.  Esophageal biopsies suggested intestinal metaplasia consistent with Barrett's esophagus.  There was no evidence for dysplasia. She was taking daily PPI therapy until she ran out 3 weeks ago. She has had recurrence in her reflux with associated cough, neartburn, hoarseness since that time.   Has rare constipation. Continues on a fiber supplement but doesn't think it's working. She had a posterior anal fissure in 2020, but, has no ongoing rectal pain or bleeding.    Past Medical History:  Diagnosis Date  . Anemia   . Anxiety   . Arthritis    osteoarthritis  . Breast cancer ([place])    bilat 2018  . Cancer ([place])    melanoma right arm  . Depression   . Family history of breast cancer   . Family history of lung cancer   . Gallstone   . GERD (gastroesophageal reflux disease)    occasionally uses tums or rolaids  . Headache   . Hypercholesteremia   . Hypertension   . Melanoma ([place]) 11/13/2010   in situ right forearm- exc  . Personal history of radiation therapy   . Restless leg syndrome   . UTI (urinary tract infection)     Past Surgical History:  Procedure Laterality Date  . ABDOMINAL HYSTERECTOMY    . BREAST BIOPSY    . BREAST CYST EXCISION    . BREAST LUMPECTOMY Bilateral  2018  . BREAST LUMPECTOMY WITH RADIOACTIVE SEED AND SENTINEL LYMPH NODE BIOPSY Bilateral 10/15/2016   Procedure: RIGHT BREAST LUMPECTOMY WITH BRACKETED RADIOACTIVE SEEDS AND RIGHT SENTINEL LYMPH NODE BIOPSY, LEFT BREAST LUMPECTOMY WITH RADIOACTIVE SEED AND LEFT SENTINEL LYMPH NODE BIOPSY;  Surgeon: [name], MD;  Location: [facility];  Service: General;  Laterality: Bilateral;  . BREAST SURGERY     left breast cyst removed  . CATARACT EXTRACTION W/PHACO  03/11/2012   Procedure: CATARACT EXTRACTION PHACO AND INTRAOCULAR  LENS PLACEMENT (IOC);  Surgeon: Tonny Branch, MD;  [facility]: [facility];  Service: Ophthalmology;  Laterality: Right;  CDE:17.71  . CATARACT EXTRACTION W/PHACO  03/29/2012   Procedure: CATARACT EXTRACTION PHACO AND INTRAOCULAR LENS PLACEMENT (IOC);  Surgeon: Tonny Branch, MD;  [facility]: [facility];  Service: Ophthalmology;  Laterality: Left;  CDE:17.49  . CHOLECYSTECTOMY  10 yrs ago   Arnoldo Morale  . COLONOSCOPY     before 2009  . EYE SURGERY Bilateral    cataract extraction  . MUSCLE BIOPSY     removal of cyst-right calf  . TOTAL HIP ARTHROPLASTY Left 12/25/2014   Procedure: TOTAL HIP ARTHROPLASTY;  Surgeon: Dereck Leep, MD;  Location: [facility];  Service: Orthopedics;  Laterality: Left;  . TOTAL HIP ARTHROPLASTY Right 06/23/2016   Procedure: TOTAL HIP ARTHROPLASTY;  Surgeon: Dereck Leep, MD;  Location: [facility];  Service: Orthopedics;  Laterality: Right;  . YAG LASER APPLICATION Right 4/54/0981   Procedure: YAG LASER APPLICATION;  Surgeon: Williams Che, MD;  [facility];  Service: Ophthalmology;  Laterality: Right;  . YAG LASER APPLICATION Left 1/91/4782   Procedure: YAG LASER APPLICATION;  Surgeon: Williams Che, MD;  [facility]: [facility];  Service: Ophthalmology;  Laterality: Left;    Current Outpatient Medications  Medication Sig Dispense Refill  . AMBULATORY NON FORMULARY MEDICATION Medication Name: Nitroglycerin ointment 0.125 mg apply a pea size amount rectally three times a day for 6-8 weeks. 1 Tube 1  . anastrozole (ARIMIDEX) 1 MG tablet Take 1 tablet (1 mg total) by mouth daily. 90 tablet 3  . atenolol (TENORMIN) 50 MG tablet Take 50 mg by mouth daily. In am.    . fluorometholone (FML) 0.1 % ophthalmic suspension Place 0.1 mLs into the right eye 1 day or 1 dose.    . fluticasone (FLONASE) 50 MCG/ACT nasal spray 2 sprays daily.    Marland Kitchen gabapentin (NEURONTIN) 100 MG capsule     . hydrochlorothiazide (HYDRODIURIL) 25 MG tablet Take 25 mg by mouth daily. In am.    . ofloxacin (OCUFLOX)  0.3 % ophthalmic solution PLACE 1 GTT OD TID FOR 5 DAYS    . omeprazole (PRILOSEC) 40 MG capsule TAKE 1 CAPSULE(40 MG) BY MOUTH DAILY 90 capsule 0  . Potassium Chloride ER 20 MEQ TBCR TAKE 2 TABLETS BY MOUTH EVERY MORNING AND 1 TABLET EVERY EVENING    . potassium chloride SA (K-DUR,KLOR-CON) 20 MEQ tablet Take 20-40 mEq by mouth 2 (two) times daily. 40 MEQ in the morning & 20 MEQ at night after supper    . rOPINIRole (REQUIP) 1 MG tablet Take 1 mg by mouth daily at 8 pm.     . sertraline (ZOLOFT) 100 MG tablet Take 100 mg by mouth daily. In am.    . simvastatin (ZOCOR) 20 MG tablet Take 20 mg by mouth daily. In am.    . traZODone (DESYREL) 100 MG tablet Take 100 mg by mouth at bedtime.   2   No current facility-administered  medications for this visit.    Allergies as of 07/02/2020 - Review Complete 07/02/2020  Allergen Reaction Noted  . Cefuroxime Rash 12/13/2014    Family History  Problem Relation Age of Onset  . Cancer Brother        lung cancer. Never smoked or drink   . Cancer Other 60       breast cancer   . Cancer Sister 91       breast cancer   . Breast cancer Sister   . Thyroid cancer Sister   . Colon cancer Neg Hx   . Esophageal cancer Neg Hx       Filed Weights   07/02/20 0950  Weight: 166 lb (75.3 kg)    Physical Exam: Vital signs were reviewed. General:   Alert, well-nourished, pleasant and cooperative in NAD.  Appears her stated age. Abdomen:  Soft, nontender, mild central obesity, normal bowel sounds. No rebound or guarding. No hepatosplenomegaly Neurologic:  Alert and  oriented x4;  grossly nonfocal Skin:  No rash or bruise. Psych:  Alert and cooperative. Normal mood and affect.   Kimberly L. Tarri Glenn, MD, MPH [facility] 07/02/2020, 10:07 AM

## 2020-07-02 NOTE — Patient Instructions (Addendum)
I have updated your prescription for omeprazole. Please continue to take this once daily.  Taking Miralax 17g daily may provide some regularity for your bowel habits. Trying this at night may be less disruptive for you.  You will be due another upper endoscopy in January 2023. Please let me know if you need anything prior to that time.   If you are age 83 or older, your body mass index should be between 23-30. Your Body mass index is 31.37 kg/m. If this is out of the aforementioned range listed, please consider follow up with your Primary Care Provider.  If you are age 56 or younger, your body mass index should be between 19-25. Your Body mass index is 31.37 kg/m. If this is out of the aformentioned range listed, please consider follow up with your Primary Care Provider.   Due to recent changes in healthcare laws, you may see the results of your imaging and laboratory studies on MyChart before your provider has had a chance to review them.  We understand that in some cases there may be results that are confusing or concerning to you. Not all laboratory results come back in the same time frame and the provider may be waiting for multiple results in order to interpret others.  Please give [place] 48 hours in order for your provider to thoroughly review all the results before contacting the office for clarification of your results.   It was a pleasure to see you today!  Dr. Tarri Glenn

## 2020-08-20 DIAGNOSIS — H02031 Senile entropion of right upper eyelid: Secondary | ICD-10-CM | POA: Diagnosis not present

## 2020-08-20 DIAGNOSIS — H02024 Mechanical entropion of left upper eyelid: Secondary | ICD-10-CM | POA: Diagnosis not present

## 2020-08-20 DIAGNOSIS — H02011 Cicatricial entropion of right upper eyelid: Secondary | ICD-10-CM | POA: Diagnosis not present

## 2020-08-20 DIAGNOSIS — H02831 Dermatochalasis of right upper eyelid: Secondary | ICD-10-CM | POA: Diagnosis not present

## 2020-08-20 DIAGNOSIS — H02423 Myogenic ptosis of bilateral eyelids: Secondary | ICD-10-CM | POA: Diagnosis not present

## 2020-08-20 DIAGNOSIS — H02834 Dermatochalasis of left upper eyelid: Secondary | ICD-10-CM | POA: Diagnosis not present

## 2020-08-20 DIAGNOSIS — H02034 Senile entropion of left upper eyelid: Secondary | ICD-10-CM | POA: Diagnosis not present

## 2020-08-20 DIAGNOSIS — H02051 Trichiasis without entropian right upper eyelid: Secondary | ICD-10-CM | POA: Diagnosis not present

## 2020-08-20 DIAGNOSIS — H02413 Mechanical ptosis of bilateral eyelids: Secondary | ICD-10-CM | POA: Diagnosis not present

## 2020-08-20 DIAGNOSIS — H02021 Mechanical entropion of right upper eyelid: Secondary | ICD-10-CM | POA: Diagnosis not present

## 2020-09-04 DIAGNOSIS — E785 Hyperlipidemia, unspecified: Secondary | ICD-10-CM | POA: Diagnosis not present

## 2020-09-04 DIAGNOSIS — E876 Hypokalemia: Secondary | ICD-10-CM | POA: Diagnosis not present

## 2020-09-04 DIAGNOSIS — I10 Essential (primary) hypertension: Secondary | ICD-10-CM | POA: Diagnosis not present

## 2020-09-05 ENCOUNTER — Other Ambulatory Visit: Payer: Self-pay | Admitting: Hematology

## 2020-09-05 DIAGNOSIS — Z1231 Encounter for screening mammogram for malignant neoplasm of breast: Secondary | ICD-10-CM

## 2020-10-04 DIAGNOSIS — I10 Essential (primary) hypertension: Secondary | ICD-10-CM | POA: Diagnosis not present

## 2020-10-04 DIAGNOSIS — K219 Gastro-esophageal reflux disease without esophagitis: Secondary | ICD-10-CM | POA: Diagnosis not present

## 2020-10-15 ENCOUNTER — Ambulatory Visit
Admission: RE | Admit: 2020-10-15 | Discharge: 2020-10-15 | Disposition: A | Discharge status: Home / Self Care | Payer: Medicare Other | Source: Ambulatory Visit | Attending: Hematology | Admitting: Hematology

## 2020-10-15 ENCOUNTER — Other Ambulatory Visit: Payer: Self-pay

## 2020-10-15 DIAGNOSIS — Z1231 Encounter for screening mammogram for malignant neoplasm of breast: Secondary | ICD-10-CM | POA: Diagnosis not present

## 2020-11-01 ENCOUNTER — Inpatient Hospital Stay: Payer: Medicare Other | Attending: Hematology | Admitting: Hematology

## 2020-11-01 ENCOUNTER — Inpatient Hospital Stay: Payer: Medicare Other

## 2020-12-11 DIAGNOSIS — H02831 Dermatochalasis of right upper eyelid: Secondary | ICD-10-CM | POA: Diagnosis not present

## 2020-12-11 DIAGNOSIS — H02834 Dermatochalasis of left upper eyelid: Secondary | ICD-10-CM | POA: Diagnosis not present

## 2020-12-11 DIAGNOSIS — H02031 Senile entropion of right upper eyelid: Secondary | ICD-10-CM | POA: Diagnosis not present

## 2020-12-11 DIAGNOSIS — H02423 Myogenic ptosis of bilateral eyelids: Secondary | ICD-10-CM | POA: Diagnosis not present

## 2020-12-11 DIAGNOSIS — H02051 Trichiasis without entropian right upper eyelid: Secondary | ICD-10-CM | POA: Diagnosis not present

## 2020-12-11 DIAGNOSIS — H57813 Brow ptosis, bilateral: Secondary | ICD-10-CM | POA: Diagnosis not present

## 2021-01-04 DIAGNOSIS — E876 Hypokalemia: Secondary | ICD-10-CM | POA: Diagnosis not present

## 2021-01-04 DIAGNOSIS — E785 Hyperlipidemia, unspecified: Secondary | ICD-10-CM | POA: Diagnosis not present

## 2021-01-04 DIAGNOSIS — I10 Essential (primary) hypertension: Secondary | ICD-10-CM | POA: Diagnosis not present

## 2021-01-17 DIAGNOSIS — Z0000 Encounter for general adult medical examination without abnormal findings: Secondary | ICD-10-CM | POA: Diagnosis not present

## 2021-01-17 DIAGNOSIS — Z23 Encounter for immunization: Secondary | ICD-10-CM | POA: Diagnosis not present

## 2021-01-23 DIAGNOSIS — H524 Presbyopia: Secondary | ICD-10-CM | POA: Diagnosis not present

## 2021-01-29 ENCOUNTER — Other Ambulatory Visit: Payer: Self-pay

## 2021-01-29 ENCOUNTER — Ambulatory Visit
Admission: RE | Admit: 2021-01-29 | Discharge: 2021-01-29 | Disposition: A | Discharge status: Home / Self Care | Payer: Medicare Other | Source: Ambulatory Visit | Attending: Nurse Practitioner | Admitting: Nurse Practitioner

## 2021-01-29 DIAGNOSIS — K219 Gastro-esophageal reflux disease without esophagitis: Secondary | ICD-10-CM | POA: Diagnosis not present

## 2021-01-29 DIAGNOSIS — E039 Hypothyroidism, unspecified: Secondary | ICD-10-CM | POA: Diagnosis not present

## 2021-01-29 DIAGNOSIS — Z780 Asymptomatic menopausal state: Secondary | ICD-10-CM | POA: Diagnosis not present

## 2021-01-29 DIAGNOSIS — G2581 Restless legs syndrome: Secondary | ICD-10-CM | POA: Diagnosis not present

## 2021-01-29 DIAGNOSIS — I10 Essential (primary) hypertension: Secondary | ICD-10-CM | POA: Diagnosis not present

## 2021-01-29 DIAGNOSIS — Z1159 Encounter for screening for other viral diseases: Secondary | ICD-10-CM | POA: Diagnosis not present

## 2021-01-29 DIAGNOSIS — E1129 Type 2 diabetes mellitus with other diabetic kidney complication: Secondary | ICD-10-CM | POA: Diagnosis not present

## 2021-01-29 DIAGNOSIS — M85832 Other specified disorders of bone density and structure, left forearm: Secondary | ICD-10-CM | POA: Diagnosis not present

## 2021-01-29 DIAGNOSIS — C50811 Malignant neoplasm of overlapping sites of right female breast: Secondary | ICD-10-CM

## 2021-02-05 DIAGNOSIS — R7309 Other abnormal glucose: Secondary | ICD-10-CM | POA: Diagnosis not present

## 2021-02-05 DIAGNOSIS — E785 Hyperlipidemia, unspecified: Secondary | ICD-10-CM | POA: Diagnosis not present

## 2021-02-05 DIAGNOSIS — Z23 Encounter for immunization: Secondary | ICD-10-CM | POA: Diagnosis not present

## 2021-02-05 DIAGNOSIS — Z0001 Encounter for general adult medical examination with abnormal findings: Secondary | ICD-10-CM | POA: Diagnosis not present

## 2021-02-05 DIAGNOSIS — E1129 Type 2 diabetes mellitus with other diabetic kidney complication: Secondary | ICD-10-CM | POA: Diagnosis not present

## 2021-02-05 DIAGNOSIS — I10 Essential (primary) hypertension: Secondary | ICD-10-CM | POA: Diagnosis not present

## 2021-02-12 ENCOUNTER — Telehealth: Payer: Self-pay

## 2021-02-12 NOTE — Telephone Encounter (Signed)
-----   Message from Alla Feeling, NP sent at 02/10/2021  2:49 PM EST ----- I can't tell that this message was ever relayed to pt and she does not have an appointment scheduled. Please see below.  Please let pt know her DEXA showed very mild osteopenia which is new from 2020. She is overdue for follow up, if she agrees, please send schedule message to arrange lab and f/up with me or Dr. Burr Medico in November. In the meantime verify she is taking daily calcium and vitamin D. We can talk more about results at her visit. Thanks, Regan Rakers, NP

## 2021-02-12 NOTE — Telephone Encounter (Signed)
This nurse spoke with patient and made her aware of Dexa results and recommendations.  Patient states that she will start taking Calcium and Vitamin D daily as she is currently not taking any.  She does not want to schedule any appointments right now because her husband is having surgery on this Thursday and she wants to get him settled first.  She states that she will call at a later time to make her appointments.  No further questions or concerns at this time.

## 2021-02-13 ENCOUNTER — Telehealth: Payer: Self-pay

## 2021-02-14 ENCOUNTER — Other Ambulatory Visit: Payer: Self-pay | Admitting: Nurse Practitioner

## 2021-02-14 DIAGNOSIS — C50811 Malignant neoplasm of overlapping sites of right female breast: Secondary | ICD-10-CM

## 2021-02-14 DIAGNOSIS — Z170 Estrogen receptor positive status [ER+]: Secondary | ICD-10-CM

## 2021-02-14 NOTE — Telephone Encounter (Signed)
Pt called regarding a question of how much vitamin D3 and how much calcium supplements to take. Per Cira Rue, NP pt to be made aware to take a combination tablet of the two. Pt informed via a voicemail to take an OTC combo tablet and that this could also equal out to about 14mcg of Vitamin D3 and 600-800mg  of calcium. This RN provided a call back number for any further questions.

## 2021-03-06 DIAGNOSIS — E785 Hyperlipidemia, unspecified: Secondary | ICD-10-CM | POA: Diagnosis not present

## 2021-03-06 DIAGNOSIS — I10 Essential (primary) hypertension: Secondary | ICD-10-CM | POA: Diagnosis not present

## 2021-03-06 DIAGNOSIS — E876 Hypokalemia: Secondary | ICD-10-CM | POA: Diagnosis not present

## 2021-03-16 ENCOUNTER — Other Ambulatory Visit: Payer: Self-pay | Admitting: Gastroenterology

## 2021-09-06 ENCOUNTER — Other Ambulatory Visit: Payer: Self-pay | Admitting: Hematology

## 2021-09-06 DIAGNOSIS — Z1231 Encounter for screening mammogram for malignant neoplasm of breast: Secondary | ICD-10-CM

## 2021-10-16 ENCOUNTER — Ambulatory Visit
Admission: RE | Admit: 2021-10-16 | Discharge: 2021-10-16 | Disposition: A | Discharge status: Home / Self Care | Payer: Medicare Other | Source: Ambulatory Visit | Attending: Hematology | Admitting: Hematology

## 2021-10-16 DIAGNOSIS — Z1231 Encounter for screening mammogram for malignant neoplasm of breast: Secondary | ICD-10-CM

## 2021-10-18 ENCOUNTER — Other Ambulatory Visit: Payer: Self-pay | Admitting: Hematology

## 2021-10-18 DIAGNOSIS — R928 Other abnormal and inconclusive findings on diagnostic imaging of breast: Secondary | ICD-10-CM

## 2021-10-25 ENCOUNTER — Ambulatory Visit
Admission: RE | Admit: 2021-10-25 | Discharge: 2021-10-25 | Disposition: A | Discharge status: Home / Self Care | Payer: Medicare Other | Source: Ambulatory Visit | Attending: Hematology | Admitting: Hematology

## 2021-10-25 DIAGNOSIS — R928 Other abnormal and inconclusive findings on diagnostic imaging of breast: Secondary | ICD-10-CM

## 2022-06-16 ENCOUNTER — Ambulatory Visit: Payer: Medicare Other | Admitting: Podiatry

## 2022-06-19 ENCOUNTER — Encounter: Payer: Self-pay | Admitting: Podiatry

## 2022-06-19 ENCOUNTER — Ambulatory Visit: Payer: Medicare Other | Admitting: Podiatry

## 2022-06-19 ENCOUNTER — Ambulatory Visit (INDEPENDENT_AMBULATORY_CARE_PROVIDER_SITE_OTHER): Payer: Medicare Other

## 2022-06-19 DIAGNOSIS — M2041 Other hammer toe(s) (acquired), right foot: Secondary | ICD-10-CM

## 2022-06-19 DIAGNOSIS — M2042 Other hammer toe(s) (acquired), left foot: Secondary | ICD-10-CM

## 2022-06-19 NOTE — Progress Notes (Signed)
Subjective:  Patient ID: Julie Sosa, female    DOB: Nov 19, 1935,  MRN: [id number] HPI Chief Complaint  Patient presents with   Toe Pain    2nd toe right - broke toe 2 years ago, now its a hammertoe and overlapping the big toe, tender with shoes   New Patient (Initial Visit)    87 y.o. female presents with the above complaint.   ROS: Denies fever chills nausea vomit muscle aches pains calf pain back pain chest pain shortness of breath.  Past Medical History:  Diagnosis Date   Anemia    Anxiety    Arthritis    osteoarthritis   Breast cancer ([place])    bilat 2018   Cancer ([place])    melanoma right arm   Depression    Family history of breast cancer    Family history of lung cancer    Gallstone    GERD (gastroesophageal reflux disease)    occasionally uses tums or rolaids   Headache    Hypercholesteremia    Hypertension    Melanoma ([place]) 11/13/2010   in situ right forearm- exc   Personal history of radiation therapy    Restless leg syndrome    UTI (urinary tract infection)    Past Surgical History:  Procedure Laterality Date   ABDOMINAL HYSTERECTOMY     BREAST CYST EXCISION     BREAST LUMPECTOMY Bilateral    2018   BREAST LUMPECTOMY WITH RADIOACTIVE SEED AND SENTINEL LYMPH NODE BIOPSY Bilateral 10/15/2016   Procedure: RIGHT BREAST LUMPECTOMY WITH BRACKETED RADIOACTIVE SEEDS AND RIGHT SENTINEL LYMPH NODE BIOPSY, LEFT BREAST LUMPECTOMY WITH RADIOACTIVE SEED AND LEFT SENTINEL LYMPH NODE BIOPSY;  Surgeon: [name], MD;  Location: [facility];  Service: General;  Laterality: Bilateral;   BREAST SURGERY     left breast cyst removed   CATARACT EXTRACTION W/PHACO  03/11/2012   Procedure: CATARACT EXTRACTION PHACO AND INTRAOCULAR LENS PLACEMENT ([place]);  Surgeon: Tonny Branch, MD;  Location: [facility];  Service: Ophthalmology;  Laterality: Right;  CDE:17.71   CATARACT EXTRACTION W/PHACO  03/29/2012   Procedure: CATARACT EXTRACTION PHACO AND INTRAOCULAR LENS PLACEMENT ([facility]);  Surgeon:  Tonny Branch, MD;  Location: [facility];  Service: Ophthalmology;  Laterality: Left;  CDE:17.49   CHOLECYSTECTOMY  10 yrs ago   [place]   COLONOSCOPY     before 2009   EYE SURGERY Bilateral    cataract extraction   MUSCLE BIOPSY     removal of cyst-right calf   TOTAL HIP ARTHROPLASTY Left 12/25/2014   Procedure: TOTAL HIP ARTHROPLASTY;  Surgeon: Dereck Leep, MD;  Location: [facility];  Service: Orthopedics;  Laterality: Left;   TOTAL HIP ARTHROPLASTY Right 06/23/2016   Procedure: TOTAL HIP ARTHROPLASTY;  Surgeon: Dereck Leep, MD;  Location: [facility];  Service: Orthopedics;  Laterality: Right;   YAG LASER APPLICATION Right [id number]   Procedure: YAG LASER APPLICATION;  Surgeon: Williams Che, MD;  Location: [facility];  Service: Ophthalmology;  Laterality: Right;   YAG LASER APPLICATION Left [id number]   Procedure: YAG LASER APPLICATION;  Surgeon: Williams Che, MD;  Location: [facility];  Service: Ophthalmology;  Laterality: Left;    Current Outpatient Medications:    AMBULATORY NON FORMULARY MEDICATION, Medication Name: Nitroglycerin ointment 0.125 mg apply a pea size amount rectally three times a day for 6-8 weeks., Disp: 1 Tube, Rfl: 1   anastrozole (ARIMIDEX) 1 MG tablet, Take 1 tablet (1 mg total) by mouth daily., Disp: 90 tablet, Rfl: 3  atenolol (TENORMIN) 50 MG tablet, Take 50 mg by mouth daily. In am., Disp: , Rfl:    fluorometholone (FML) 0.1 % ophthalmic suspension, Place 0.1 mLs into the right eye 1 day or 1 dose., Disp: , Rfl:    fluticasone (FLONASE) 50 MCG/ACT nasal spray, 2 sprays daily., Disp: , Rfl:    gabapentin (NEURONTIN) 100 MG capsule, , Disp: , Rfl:    hydrochlorothiazide (HYDRODIURIL) 25 MG tablet, Take 25 mg by mouth daily. In am., Disp: , Rfl:    ofloxacin (OCUFLOX) 0.3 % ophthalmic solution, PLACE 1 GTT OD TID FOR 5 DAYS, Disp: , Rfl:    omeprazole (PRILOSEC) 40 MG capsule, TAKE 1 CAPSULE(40 MG) BY MOUTH DAILY, Disp: 90 capsule, Rfl: 2   oxybutynin (DITROPAN) 5  MG tablet, Take 5 mg by mouth daily., Disp: , Rfl:    Potassium Chloride ER 20 MEQ TBCR, TAKE 2 TABLETS BY MOUTH EVERY MORNING AND 1 TABLET EVERY EVENING, Disp: , Rfl:    potassium chloride SA (K-DUR,KLOR-CON) 20 MEQ tablet, Take 20-40 mEq by mouth 2 (two) times daily. 40 MEQ in the morning & 20 MEQ at night after supper, Disp: , Rfl:    rOPINIRole (REQUIP) 1 MG tablet, Take 1 mg by mouth daily at 8 pm. , Disp: , Rfl:    sertraline (ZOLOFT) 100 MG tablet, Take 100 mg by mouth daily. In am., Disp: , Rfl:    simvastatin (ZOCOR) 20 MG tablet, Take 20 mg by mouth daily. In am., Disp: , Rfl:    traZODone (DESYREL) 100 MG tablet, Take 100 mg by mouth at bedtime. , Disp: , Rfl: 2  Allergies  Allergen Reactions   Cefuroxime Rash   Review of Systems Objective:  There were no vitals filed for this visit.  General: Well developed, nourished, in no acute distress, alert and oriented x3   Dermatological: Skin is warm, dry and supple bilateral. Nails x 10 are well maintained; remaining integument appears unremarkable at this time. There are no open sores, no preulcerative lesions, no rash or signs of infection present.  Vascular: Dorsalis Pedis artery and Posterior Tibial artery pedal pulses are 2/4 bilateral with immedate capillary fill time. Pedal hair growth present. No varicosities and no lower extremity edema present bilateral.   Neruologic: Grossly intact via light touch bilateral. Vibratory intact via tuning fork bilateral. Protective threshold with Semmes Wienstein monofilament intact to all pedal sites bilateral. Patellar and Achilles deep tendon reflexes 2+ bilateral. No Babinski or clonus noted bilateral.   Musculoskeletal: No gross boney pedal deformities bilateral. No pain, crepitus, or limitation noted with foot and ankle range of motion bilateral. Muscular strength 5/5 in all groups tested bilateral.  Rigid hammertoe deformity at the PIPJ second digit of the right foot overlapping the hallux  right which does demonstrate limited range of motion secondary to his severe hallux abductovalgus deformity.  Gait: Unassisted, Nonantalgic.    Radiographs:  Radiographs taken today demonstrate an osseously mature individual with considerable demineralization of the bone.  Moderate to severe hallux abductovalgus deformity of the right foot with osteoarthritic changes at the metatarsal phalangeal joint she has medial deviation of all of the lesser digits with hammertoe deformity to the second toe in particular with osteoarthritic changes at the level of the PIPJ.  Second digit appears to be overlapping the first.  Assessment & Plan:   Assessment: Hallux abductovalgus deformity with painful hammertoe deformity rigid in nature at the PIPJ second digit right foot.  Plan: Discussed etiology pathology conservative  versus surgical therapies at this point I discussed amputating the toe as opposed to trying to correct the bunion deformity.  She states that she would like to have the bunion deformity correctable but she is afraid that it would continue to worsen as time goes on.  Unguinal refer her to Dr. Sherryle Lis for second opinion.  I highly recommend amputation and disarticulation of the second toe if any type of procedure to be done.  However I will be up to him as to whether or not he would like to consider correction of her first intermetatarsal angle.     Timberlee Roblero T. [place], [place]

## 2022-06-25 ENCOUNTER — Ambulatory Visit: Payer: Medicare Other | Admitting: Podiatry

## 2022-06-25 DIAGNOSIS — M21611 Bunion of right foot: Secondary | ICD-10-CM | POA: Diagnosis not present

## 2022-06-25 DIAGNOSIS — M2041 Other hammer toe(s) (acquired), right foot: Secondary | ICD-10-CM | POA: Diagnosis not present

## 2022-06-25 DIAGNOSIS — M2011 Hallux valgus (acquired), right foot: Secondary | ICD-10-CM | POA: Diagnosis not present

## 2022-06-25 NOTE — Progress Notes (Signed)
Subjective:  Patient ID: Julie Sosa, female    DOB: 1935/06/06,  MRN: [id number]  Chief Complaint  Patient presents with   Julie Sosa    Consult with Sherryle Lis [facility] referral)    87 y.o. female presents with the above complaint. History confirmed with patient.  She presents today for follow-up of her right foot bunion and hammertoe.  She is referred to me by Dr. Milinda Pointer.  Her primary area of complaint is the big toe rubbing on the second toe and the second toe pushing up and rubbing on her shoes.  They previously discussed amputation of the second toe.  She is not opposed to this but there is concerned that the big toe will drift over further and the lesser toes will drift over further as well.  She also has an ingrown toenail on the right great toe  Objective:  Physical Exam: warm, good capillary refill, no trophic changes or ulcerative lesions, normal DP and PT pulses, normal sensory exam, and crossover second hammertoe over severe hallux valgus deformity.  Hallux valgus deformity is fairly reducible and not track bound.  Reducible hammertoe deformities of third and fourth toes.  Second MTPJ is completely dislocated with pain and rigid toe deformity.  Ingrown toenail right great toe no signs of infection.   Radiographs: Multiple views x-ray of the right foot: Previous radiographs were reviewed and the patient has a significant hallux valgus deformity with metatarsus primus varus, crossover second toe rigid toe deformity with subluxation and dislocation of the MTPJ, digital contractures of the third and fourth with congruency of the joints. Assessment:   1. Hammertoe of right foot   2. Hallux valgus with bunions, right      Plan:  Patient was evaluated and treated and all questions answered.  Regarding the painful ingrowing nail we will be able to treat this as an outpatient with matricectomy.  I recommended not doing this during the time of surgery due to the possibility of spread  of infection.  We again reviewed her radiographs together.  Her daughter was present and reviewed them with me as well.  I discussed with him that I do not think that her second toe deformity is reconstructive all due to the severity and her age and bone stock.  I recommended amputation as well of the second toe.  They did have concerns of worsening deformity of the lesser toes as well as the great toe.  I discussed with him that we could consider modified McBride matricectomy as her hallux valgus deformity is quite reducible with removal of the mid eminence and capsulorrhaphy of the medial capsule with a nonabsorbable suture and release of the lateral sesamoid complex.  We also discussed flexor tenotomy of the third and fourth toes to reduce these contractures.  We discussed the risks of the procedure including but not limited to  pain, swelling, infection, scar, numbness which may be temporary or permanent, chronic pain, stiffness, nerve pain or damage, wound healing problems, bone healing problems including delayed or non-union.  Informed consent was signed and reviewed.  All questions were addressed.   Surgical plan:  Procedure: -Right foot modified McBride bunionectomy, second toe amputation, flexor tenotomy of the third and fourth toes.  Location: -[facility]  Anesthesia plan: -IV sedation with local block  Postoperative pain plan: - Tylenol 1000 mg every 6 hours, oxycodone 5 mg 1-2 tabs every 6 hours only as needed  DVT prophylaxis: -None required  WB Restrictions / DME needs: -WBAT in  surgical shoe which we will dispense at surgical center  No follow-ups on file.

## 2022-07-21 ENCOUNTER — Telehealth: Payer: Self-pay | Admitting: Urology

## 2022-07-21 NOTE — Telephone Encounter (Signed)
DOS - 08/15/22  KELLER/MCBRIDE BUNIONECTOMY RIGHT --- 68341 AMPUTATION MPJ RIGHT --- 96222 TENOTOMY 3,4 RIGHT --- 28011  [facility] EFFECTIVE DATE - 04/07/22  DEDUCTIBLE - $0.00 OOP - $3,600.00 W/ $3,545.00 REMAINING COINSURANCE - 0%  PER [employer] WEBSITE FOR CPT CODES 97989, [phone] AND [phone] Notification or Prior Authorization is not required for the requested services You are not required to submit a notification/prior authorization based on the information provided. If you have general questions about the prior authorization requirements, visit [email or web address] > Clinician Resources > Advance and Admission Notification Requirements. The number above acknowledges your notification. Please write this reference number down for future reference. If you would like to request an organization determination, please call us at [phone].   Decision ID #: [id number]

## 2022-08-15 ENCOUNTER — Other Ambulatory Visit: Payer: Self-pay | Admitting: Podiatry

## 2022-08-15 DIAGNOSIS — M2011 Hallux valgus (acquired), right foot: Secondary | ICD-10-CM | POA: Diagnosis not present

## 2022-08-15 DIAGNOSIS — M2041 Other hammer toe(s) (acquired), right foot: Secondary | ICD-10-CM | POA: Diagnosis not present

## 2022-08-15 MED ORDER — ACETAMINOPHEN 500 MG PO TABS: 1000.0000 mg | ORAL_TABLET | Freq: Four times a day (QID) | ORAL | 0 refills | Status: AC | PRN

## 2022-08-15 MED ORDER — OXYCODONE HCL 5 MG PO TABS: 5.0000 mg | ORAL_TABLET | Freq: Four times a day (QID) | ORAL | 0 refills | Status: AC | PRN

## 2022-08-21 ENCOUNTER — Ambulatory Visit (INDEPENDENT_AMBULATORY_CARE_PROVIDER_SITE_OTHER): Payer: Medicare Other

## 2022-08-21 ENCOUNTER — Ambulatory Visit (INDEPENDENT_AMBULATORY_CARE_PROVIDER_SITE_OTHER): Payer: Medicare Other | Admitting: Podiatry

## 2022-08-21 DIAGNOSIS — M2041 Other hammer toe(s) (acquired), right foot: Secondary | ICD-10-CM

## 2022-08-21 DIAGNOSIS — M2011 Hallux valgus (acquired), right foot: Secondary | ICD-10-CM

## 2022-08-21 DIAGNOSIS — M21611 Bunion of right foot: Secondary | ICD-10-CM | POA: Diagnosis not present

## 2022-08-25 NOTE — Progress Notes (Signed)
  Subjective:  Patient ID: Julie Sosa, female    DOB: 11-16-1935,  MRN: [id number]  Chief Complaint  Patient presents with   Routine Post Op    POV # 1 DOS 08/15/22 --- BUNIONECTOMY OF RIGHT FOOT , 2ND TOE AMPUTATION, TENDON RELEASE TOES 3RD AND 4TH      87 y.o. female returns for post-op check.  She is doing well pain is improving  Review of Systems: Negative except as noted in the HPI. Denies N/V/F/Ch.   Objective:  There were no vitals filed for this visit. There is no height or weight on file to calculate BMI. Constitutional Well developed. Well nourished.  Vascular Foot warm and well perfused. Capillary refill normal to all digits.  Calf is soft and supple, no posterior calf or knee pain, negative Homans' sign  Neurologic Normal speech. Oriented to person, place, and time. Epicritic sensation to light touch grossly present bilaterally.  Dermatologic Skin healing well without signs of infection. Skin edges well coapted without signs of infection.  Orthopedic: Tenderness to palpation noted about the surgical site.   Multiple view plain film radiographs: Good correction noted of bunion and hammertoes with interval amputation of the second toe Assessment:   1. Hallux valgus with bunions, right   2. Hammertoe of right foot    Plan:  Patient was evaluated and treated and all questions answered.  S/p foot surgery right -Progressing as expected post-operatively. -XR: Noted above -WB Status: WBAT in surgical shoe -Sutures: Return in 2 weeks to remove. -Medications: None required -Foot redressed.  Return in about 3 weeks (around 09/11/2022) for suture removal, post op (no x-rays).

## 2022-09-04 ENCOUNTER — Ambulatory Visit (INDEPENDENT_AMBULATORY_CARE_PROVIDER_SITE_OTHER): Payer: Medicare Other | Admitting: Podiatry

## 2022-09-04 DIAGNOSIS — M2041 Other hammer toe(s) (acquired), right foot: Secondary | ICD-10-CM

## 2022-09-04 DIAGNOSIS — M2011 Hallux valgus (acquired), right foot: Secondary | ICD-10-CM

## 2022-09-04 DIAGNOSIS — M21611 Bunion of right foot: Secondary | ICD-10-CM

## 2022-09-05 NOTE — Progress Notes (Signed)
  Subjective:  Patient ID: Julie Sosa, female    DOB: 1935/08/10,  MRN: [id number]  Chief Complaint  Patient presents with   Routine Post Op    POV # 2 DOS 08/15/22 --- BUNIONECTOMY OF RIGHT FOOT , 2ND TOE AMPUTATION, TENDON RELEASE TOES 3RD AND 4TH - NO NURSE SCHEDULE      87 y.o. female returns for post-op check.     Review of Systems: Negative except as noted in the HPI. Denies N/V/F/Ch.   Objective:  There were no vitals filed for this visit. There is no height or weight on file to calculate BMI. Constitutional Well developed. Well nourished.  Vascular Foot warm and well perfused. Capillary refill normal to all digits.  Calf is soft and supple, no posterior calf or knee pain, negative Homans' sign  Neurologic Normal speech. Oriented to person, place, and time. Epicritic sensation to light touch grossly present bilaterally.  Dermatologic Skin healing well without signs of infection. Skin edges well coapted without signs of infection.  Orthopedic: She has no pain   Multiple view plain film radiographs: Good correction noted of bunion and hammertoes with interval amputation of the second toe Assessment:   1. Hallux valgus with bunions, right   2. Hammertoe of right foot    Plan:  Patient was evaluated and treated and all questions answered.  S/p foot surgery right -Sutures removed today she may return to regular shoes.  I will see her back in 2 weeks and we will treat her ingrowing nail at that point with permanent matricectomy  Return in about 2 weeks (around 09/18/2022).

## 2022-09-16 ENCOUNTER — Other Ambulatory Visit: Payer: Self-pay | Admitting: Internal Medicine

## 2022-09-16 DIAGNOSIS — Z1231 Encounter for screening mammogram for malignant neoplasm of breast: Secondary | ICD-10-CM

## 2022-09-17 ENCOUNTER — Telehealth: Payer: Self-pay | Admitting: *Deleted

## 2022-09-17 NOTE — Telephone Encounter (Signed)
"  I got a call today.  I think it is pertaining to an appointment with Dr. Lilian Kapur for an ingrown toenail.  If someone wants to get back with me.  Please return my call."  I returned her call and confirmed that it was a reminder call.

## 2022-09-18 ENCOUNTER — Ambulatory Visit (INDEPENDENT_AMBULATORY_CARE_PROVIDER_SITE_OTHER): Payer: Medicare Other | Admitting: Podiatry

## 2022-09-18 DIAGNOSIS — L600 Ingrowing nail: Secondary | ICD-10-CM | POA: Diagnosis not present

## 2022-09-18 MED ORDER — NEOMYCIN-POLYMYXIN-HC 3.5-10000-1 OT SUSP
OTIC | 0 refills | Status: DC
Start: 2022-09-18 — End: 2023-08-29

## 2022-09-18 NOTE — Patient Instructions (Addendum)

## 2022-09-19 NOTE — Progress Notes (Signed)
  Subjective:  Patient ID: Julie Sosa, female    DOB: 04-05-1936,  MRN: [id number]  Chief Complaint  Patient presents with   Ingrown Toenail    Patient came in today for right foot hallux ingrown toenail, medial border, no drainage     87 y.o. female presents with the above complaint. History confirmed with patient.  Foot continues to improve still having occasional swelling.  Radiator ingrown toenail removed  Objective:  Physical Exam: warm, good capillary refill, no trophic changes or ulcerative lesions, normal DP and PT pulses, normal sensory exam, and incisions well-healed not hypertrophic, minimal edema, she has ingrown nail pincer nail deformity medial and lateral borders of the right hallux no infection   Assessment:   1. Ingrowing right great toenail      Plan:  Patient was evaluated and treated and all questions answered.     Ingrown Nail, right -Patient elects to proceed with minor surgery to remove ingrown toenail today. Consent reviewed and signed by patient. -Ingrown nail excised. See procedure note. -Educated on post-procedure care including soaking. Written instructions provided and reviewed. -Rx for Cortisporin sent to pharmacy. -Advised on signs and symptoms of infection developing.  We discussed that the phenol likely will create some redness and edema and tenderness around the nailbed as long as it is localized this is to be expected.  Will return as needed if any infection signs develop  Procedure: Excision of Ingrown Toenail Location: Right 1st toe  medial and lateral  nail borders. Anesthesia: Lidocaine 1% plain; 1.5 mL and Marcaine 0.5% plain; 1.5 mL, digital block. Skin Prep: Betadine. Dressing: Silvadene; telfa; dry, sterile, compression dressing. Technique: Following skin prep, the toe was exsanguinated and a tourniquet was secured at the base of the toe. The affected nail border was freed, split with a nail splitter, and excised. Chemical  matrixectomy was then performed with phenol and irrigated out with alcohol. The tourniquet was then removed and sterile dressing applied. Disposition: Patient tolerated procedure well.    Return if symptoms worsen or fail to improve.

## 2022-09-25 ENCOUNTER — Ambulatory Visit (INDEPENDENT_AMBULATORY_CARE_PROVIDER_SITE_OTHER): Payer: Medicare Other

## 2022-09-25 ENCOUNTER — Ambulatory Visit (INDEPENDENT_AMBULATORY_CARE_PROVIDER_SITE_OTHER): Payer: Medicare Other | Admitting: Podiatry

## 2022-09-25 DIAGNOSIS — L600 Ingrowing nail: Secondary | ICD-10-CM

## 2022-09-25 DIAGNOSIS — M2011 Hallux valgus (acquired), right foot: Secondary | ICD-10-CM

## 2022-09-25 DIAGNOSIS — M21611 Bunion of right foot: Secondary | ICD-10-CM | POA: Diagnosis not present

## 2022-09-25 DIAGNOSIS — M2041 Other hammer toe(s) (acquired), right foot: Secondary | ICD-10-CM

## 2022-09-27 NOTE — Progress Notes (Signed)
Subjective: Chief Complaint  Patient presents with   Routine Post Op   87 year old female presents the office today with the above concerns.  She presents today after undergoing partial nail avulsion right big toe.  She also recent underwent surgery in her right foot and she has been doing well from both issues.  A scab is formed along the area of the ingrown toenail.  Some discomfort but improving.  No drainage or pus.  She is still soaking her foot.  She been try to keep a spacer, cotton between her first, third toes to help keep the space.  Objective: AAO x3, NAD DP/PT pulses palpable bilaterally, CRT less than 3 seconds Status post partial nail avulsion right hallux and small scab is starting to form.  There is no edema, erythema, drainage approximately signs of infection.  No significant pain on exam today. Incisions from prior surgery well-healed. No pain with calf compression, swelling, warmth, erythema  Assessment: Status partial nail avulsion, healing well; s/p right foot surgery  Plan: -All treatment options discussed with the patient including all alternatives, risks, complications.  -Overall doing better from both standpoints.  For the ingrown toenail continue soaking Epsom salts covered with antibiotic ointment and a bandage in the day but can leave the area open at nighttime.  Monitor for any signs or symptoms of infection or reoccurrence. -Surgery well-healed.  Dispensed different toe spacers to keep between the first, third toes.  Avoid shoes with any excess pressure on the toes. -Monitor for any clinical signs or symptoms of infection and directed to call the office immediately should any occur or go to the ER. -Patient encouraged to call the office with any questions, concerns, change in symptoms.   Vivi Barrack DPM

## 2022-10-22 ENCOUNTER — Ambulatory Visit
Admission: RE | Admit: 2022-10-22 | Discharge: 2022-10-22 | Disposition: A | Discharge status: Home / Self Care | Payer: Medicare Other | Source: Ambulatory Visit | Attending: Internal Medicine | Admitting: Internal Medicine

## 2022-10-22 DIAGNOSIS — Z1231 Encounter for screening mammogram for malignant neoplasm of breast: Secondary | ICD-10-CM

## 2022-11-11 ENCOUNTER — Ambulatory Visit: Payer: Medicare Other | Admitting: Podiatry

## 2022-11-11 DIAGNOSIS — M2041 Other hammer toe(s) (acquired), right foot: Secondary | ICD-10-CM

## 2022-11-11 DIAGNOSIS — M2011 Hallux valgus (acquired), right foot: Secondary | ICD-10-CM

## 2022-11-11 DIAGNOSIS — M21611 Bunion of right foot: Secondary | ICD-10-CM

## 2022-11-11 DIAGNOSIS — L600 Ingrowing nail: Secondary | ICD-10-CM

## 2022-11-12 NOTE — Progress Notes (Signed)
  Subjective:  Patient ID: Julie Sosa, female    DOB: January 01, 1936,  MRN: [id number]  Chief Complaint  Patient presents with   Routine Post Op      87 y.o. female returns for post-op check.  She is doing well does have some swelling around the great toe joint and is using the toe spacer to keep the toe from drifting over further  Review of Systems: Negative except as noted in the HPI. Denies N/V/F/Ch.   Objective:  There were no vitals filed for this visit. There is no height or weight on file to calculate BMI. Constitutional Well developed. Well nourished.  Vascular Foot warm and well perfused. Capillary refill normal to all digits.  Calf is soft and supple, no posterior calf or knee pain, negative Homans' sign  Neurologic Normal speech. Oriented to person, place, and time. Epicritic sensation to light touch grossly present bilaterally.  Dermatologic Incision is well-healed and not hypertrophic there are no signs of infection, no ingrown toenail active  Orthopedic: Hallux has moved further into valgus position, third toe is in adductus position   Multiple view plain film radiographs: Good correction noted of bunion and hammertoes with interval amputation of the second toe Assessment:   1. Hallux valgus with bunions, right   2. Ingrowing right great toenail   3. Hammertoe of right foot     Plan:  Patient was evaluated and treated and all questions answered.  S/p foot surgery right -Has had some deviation of the toes after amputation of the second toe which we expected.  She is utilizing silicone spacers and this is relieving this.  She does have some swelling but minimal pain.  I discussed with her if this worsens or develops into pain in the joint that arthritic changes may be developing and corticosteroid injection could offer her relief.  She may continue her full activity in regular shoe gear.  She has no further ingrowing toenail.  Return to see me as needed Return if  symptoms worsen or fail to improve.

## 2022-12-24 ENCOUNTER — Other Ambulatory Visit (HOSPITAL_COMMUNITY): Payer: Self-pay | Admitting: Internal Medicine

## 2022-12-24 DIAGNOSIS — R443 Hallucinations, unspecified: Secondary | ICD-10-CM

## 2022-12-26 ENCOUNTER — Ambulatory Visit (HOSPITAL_COMMUNITY)
Admission: RE | Admit: 2022-12-26 | Discharge: 2022-12-26 | Disposition: A | Discharge status: Home / Self Care | Payer: Medicare Other | Source: Ambulatory Visit | Attending: Internal Medicine | Admitting: Internal Medicine

## 2022-12-26 DIAGNOSIS — R443 Hallucinations, unspecified: Secondary | ICD-10-CM | POA: Insufficient documentation

## 2023-01-02 ENCOUNTER — Other Ambulatory Visit (HOSPITAL_COMMUNITY): Payer: Self-pay | Admitting: Internal Medicine

## 2023-01-02 DIAGNOSIS — I63521 Cerebral infarction due to unspecified occlusion or stenosis of right anterior cerebral artery: Secondary | ICD-10-CM

## 2023-01-06 ENCOUNTER — Ambulatory Visit (HOSPITAL_COMMUNITY)
Admission: RE | Admit: 2023-01-06 | Discharge: 2023-01-06 | Disposition: A | Discharge status: Home / Self Care | Payer: Medicare Other | Source: Ambulatory Visit | Attending: Internal Medicine | Admitting: Internal Medicine

## 2023-01-06 DIAGNOSIS — I63521 Cerebral infarction due to unspecified occlusion or stenosis of right anterior cerebral artery: Secondary | ICD-10-CM | POA: Insufficient documentation

## 2023-01-06 MED ORDER — IOHEXOL 350 MG/ML SOLN
75.0000 mL | Freq: Once | INTRAVENOUS | Status: AC | PRN
  Administered 2023-01-06: 75 mL via INTRAVENOUS

## 2023-03-16 ENCOUNTER — Telehealth: Payer: Self-pay | Admitting: Neurology

## 2023-03-16 NOTE — Telephone Encounter (Signed)
For Documentation: daughter was checking for an earlier appointment, no call back requested

## 2023-03-19 ENCOUNTER — Encounter: Payer: Self-pay | Admitting: Neurology

## 2023-03-19 ENCOUNTER — Ambulatory Visit: Payer: Medicare Other | Admitting: Neurology

## 2023-03-19 VITALS — BP 118/64 | Ht 61.0 in | Wt 146.0 lb

## 2023-03-19 DIAGNOSIS — I6381 Other cerebral infarction due to occlusion or stenosis of small artery: Secondary | ICD-10-CM | POA: Diagnosis not present

## 2023-03-19 DIAGNOSIS — G3184 Mild cognitive impairment, so stated: Secondary | ICD-10-CM

## 2023-03-19 DIAGNOSIS — R413 Other amnesia: Secondary | ICD-10-CM

## 2023-03-19 MED ORDER — MEMANTINE HCL 28 X 5 MG & 21 X 10 MG PO TABS: ORAL_TABLET | ORAL | 12 refills | Status: AC

## 2023-03-19 MED ORDER — MEMANTINE HCL 10 MG PO TABS: 10.0000 mg | ORAL_TABLET | Freq: Two times a day (BID) | ORAL | 3 refills | Status: DC

## 2023-03-19 NOTE — Progress Notes (Signed)
[facility] [address] [place]. [place] [address] [phone]       OFFICE CONSULT NOTE  Julie Sosa Date of Birth:  [birth year] Medical Record Number:  [id number]   Referring MD: Carylon Perches  Reason for Referral: Stroke  HPI: Julie Sosa is a pleasant 87 year old Caucasian lady seen today for initial office consultation visit.  She is accompanied by his daughter.  History is obtained from them and review of electronic medical records.  I personally reviewed pertinent available imaging films in PACS.  She has past medical history of hypertension, hyperlipidemia, gastroesophageal flux disease, arthritis, anemia, restless leg syndrome. Patient daughter states that they noticed that her confusion and disorientation which came on suddenly 1 day 3 months ago.  When the daughter went to her home she found the patient to not be able to understand very well and if kept repeating herself and was disoriented.  She had taken to days worth of medicine at the same time which is unusual for her.  Since her confusion persisted this her primary physician will order outpatient MRI which was done on 12/26/2022 and showed right thalamic capsular infarct.  CT angiogram of the brain and neck on 01/06/2023 showed no significant large vessel stenosis or occlusion.  Patient's confusion and memory and cognitive difficulties appear to have persisted and seem to be getting worse.  She occasionally sees a dead husband talks to her.  She gets days and nights mixed.  She is still living alone.  She is still some driving but has not gotten lost.  Needs some help with activities of daily living like setting her meals and medications.  Daughter is concerned about her decline in cognition feels she may need more help.  There is no prior history of stroke, significant head injury with loss of consciousness, seizures or headaches.  There is no family history of dementia. ROS:   14 system review of systems is  positive for confusion, disorientation, memory loss and hallucinations and all of systems negative  PMH:  Past Medical History:  Diagnosis Date   Anemia    Anxiety    Arthritis    osteoarthritis   Breast cancer (HCC)    bilat 2018   Cancer (HCC)    melanoma right arm   Depression    Family history of breast cancer    Family history of lung cancer    Gallstone    GERD (gastroesophageal reflux disease)    occasionally uses tums or rolaids   Headache    Hypercholesteremia    Hypertension    Melanoma (HCC) 11/13/2010   in situ right forearm- exc   Personal history of radiation therapy    Restless leg syndrome    UTI (urinary tract infection)     Social History:  Social History   Socioeconomic History   Marital status: Widowed    Spouse name: Not on file   Number of children: Not on file   Years of education: Not on file   Highest education level: Not on file  Occupational History   Occupation: Retired  Tobacco Use   Smoking status: Never   Smokeless tobacco: Never  Vaping Use   Vaping status: Never Used  Substance and Sexual Activity   Alcohol use: Not Currently   Drug use: Never   Sexual activity: Yes    Birth control/protection: Surgical  Other Topics Concern   Not on file  Social History Narrative   Pt lives alone  Retired    Chief Executive Officer D[employer] of Community education officer: Not on [employer] Insecurity: Not on file  Transportation Needs: Not on file  Physical Activity: Not on file  Stress: Not on file  Social Connections: Not on file  Intimate Partner Violence: Not on file    Medications:   Current Outpatient Medications on File Prior to Visit  Medication Sig Dispense Refill   AMBULATORY NON FORMULARY MEDICATION Medication Name: Nitroglycerin ointment 0.125 mg apply a pea size amount rectally three times a day for 6-8 weeks. 1 Tube 1   anastrozole (ARIMIDEX) 1 MG tablet Take 1 tablet (1 mg total) by mouth daily. 90 tablet 3   atenolol  (TENORMIN) 50 MG tablet Take 50 mg by mouth daily. In am.     BAYER ASPIRIN PO Take by mouth.     fluorometholone (FML) 0.1 % ophthalmic suspension Place 0.1 mLs into the right eye 1 day or 1 dose.     fluticasone (FLONASE) 50 MCG/ACT nasal spray 2 sprays daily.     gabapentin (NEURONTIN) 100 MG capsule      hydrochlorothiazide (HYDRODIURIL) 25 MG tablet Take 25 mg by mouth daily. In am.     neomycin-polymyxin-hydrocortisone (CORTISPORIN) 3.5-10000-1 OTIC suspension Apply 1-2 drops daily after soaking and cover with bandaid 10 mL 0   ofloxacin (OCUFLOX) 0.3 % ophthalmic solution PLACE 1 GTT OD TID FOR 5 DAYS     omeprazole (PRILOSEC) 40 MG capsule TAKE 1 CAPSULE(40 MG) BY MOUTH DAILY 90 capsule 2   oxybutynin (DITROPAN) 5 MG tablet Take 5 mg by mouth daily.     Potassium Chloride ER 20 MEQ TBCR TAKE 2 TABLETS BY MOUTH EVERY MORNING AND 1 TABLET EVERY EVENING     rOPINIRole (REQUIP) 1 MG tablet Take 1 mg by mouth daily at 8 pm.      sertraline (ZOLOFT) 100 MG tablet Take 100 mg by mouth daily. In am.     traZODone (DESYREL) 100 MG tablet Take 100 mg by mouth at bedtime.   2   simvastatin (ZOCOR) 20 MG tablet Take 20 mg by mouth daily. In am.     No current facility-administered medications on file prior to visit.    Allergies:   Allergies  Allergen Reactions   Cefuroxime Rash    Physical Exam General: Mildly obese pleasant elderly Caucasian lady seated, in no evident distress Head: head normocephalic and atraumatic.   Neck: supple with no carotid or supraclavicular bruits Cardiovascular: regular rate and rhythm, no murmurs Musculoskeletal: no deformity Skin:  no rash/petichiae Vascular:  Normal pulses all extremities  Neurologic Exam Mental Status: Awake and fully alert. Oriented to place and time. Recent and remote memory i diminished attention span, concentration and fund of knowledge poor. Mood and affect appropriate.  Diminished recall 2/3.  Clock drawing 3/4.  Able to name 10  animals which can walk on 4 legs.  Mini-Mental status exam score 26/30. Cranial Nerves: Fundoscopic exam reveals sharp disc margins. Pupils equal, briskly reactive to light. Extraocular movements full without nystagmus. Visual fields full to confrontation. Hearing slightly diminished bilaterally facial sensation intact. Face, tongue, palate moves normally and symmetrically.  Motor: Normal bulk and tone. Normal strength in all tested extremity muscles. Sensory.: intact to touch , pinprick , position and vibratory sensation.  Coordination: Rapid alternating movements normal in all extremities. Finger-to-nose and heel-to-shin performed accurately bilaterally. Gait and Station: Arises from chair without difficulty. Stance is normal. Gait is broad-based and slightly ataxic.  Unable to stand on either foot unsupported..  Not able to heel, toe and tandem walk without difficulty.  Reflexes: 1+ and symmetric. Toes downgoing.   NIHSS  2 Modified Rankin  2     03/19/2023   10:31 AM  MMSE - Mini Mental State Exam  Orientation to time 4  Orientation to Place 5  Registration 3  Attention/ Calculation 5  Recall 1  Language- name 2 objects 2  Language- repeat 1  Language- follow 3 step command 3  Language- read & follow direction 1  Write a sentence 1  Copy design 0  Total score 26    ASSESSMENT: 87 year old Caucasian lady with memory and cognitive worsening following right thalamic lacunar infarct from small vessel disease in September 2024 likely due to mild cognitive impairment/mild vascular dementia.  Vascular risk factors of hypertension mild obesity and age     PLAN:I had a long d/w patient and her daughter about her recent stroke and memory loss and cognitive deterioration likely due to mild mixed dementia, risk for recurrent stroke/TIAs, personally independently reviewed imaging studies and stroke evaluation results and answered questions.Start aspirin 81 mg daily   for secondary stroke  prevention and maintain strict control of hypertension with blood pressure goal below 130/90, diabetes with hemoglobin A1c goal below 6.5% and lipids with LDL cholesterol goal below 70 mg/dL. I also advised the patient to eat a healthy diet with plenty of whole grains, cereals, fruits and vegetables, exercise regularly and maintain ideal body weight . Check memory panel labs and EEG. Trial of Namenda for cognitive impairment.Followup in the future with me in 4 months or call earlier if needed.  Greater than 50% time during this prolonged 60-minute consultation visit were spent in counseling and coordination of care about a lacunar stroke as well as discussion about memory loss and cognitive impairment plan for evaluation and treatment and answering questions.  Delia Heady, MD Note: This document was prepared with digital dictation and possible smart phrase technology. Any transcriptional errors that result from this process are unintentional.

## 2023-03-19 NOTE — Patient Instructions (Signed)
I had a long d/w patient and her daughter about her recent stroke and memory loss and cognitive deterioration likely due to mild mixed dementia, risk for recurrent stroke/TIAs, personally independently reviewed imaging studies and stroke evaluation results and answered questions.Start aspirin 81 mg daily   for secondary stroke prevention and maintain strict control of hypertension with blood pressure goal below 130/90, diabetes with hemoglobin A1c goal below 6.5% and lipids with LDL cholesterol goal below 70 mg/dL. I also advised the patient to eat a healthy diet with plenty of whole grains, cereals, fruits and vegetables, exercise regularly and maintain ideal body weight . Check memory panel labs and EEG. Trial of Namenda for cognitive impairment.Followup in the future with me in 4 months or call earlier if needed.  Memantine Tablets What is this medication? MEMANTINE (MEM an teen) treats memory loss and confusion (dementia) in people who have Alzheimer disease. It works by improving attention, memory, and the ability to engage in daily activities. It is not a cure for dementia or Alzheimer disease. This medicine may be used for other purposes; ask your health care provider or pharmacist if you have questions. COMMON BRAND NAME(S): Namenda What should I tell my care team before I take this medication? They need to know if you have any of these conditions: Kidney disease Liver disease Seizures Trouble passing urine An unusual or allergic reaction to memantine, other medications, foods, dyes, or preservatives Pregnant or trying to get pregnant Breast-feeding How should I use this medication? Take this medication by mouth with water. Follow the directions on the prescription label. You may take this medication with or without food. Take your doses at regular intervals. Do not take your medication more often than directed. Continue to take your medication even if you feel better. Do not stop taking  except on the advice of your care team. Talk to your care team about the use of this medication in children. Special care may be needed Overdosage: If you think you have taken too much of this medicine contact a poison control center or emergency room at once. NOTE: This medicine is only for you. Do not share this medicine with others. What if I miss a dose? If you miss a dose, take it as soon as you can. If it is almost time for your next dose, take only that dose. Do not take double or extra doses. If you do not take your medication for several days, contact your care team. Your dose may need to be changed. What may interact with this medication? Acetazolamide Amantadine Cimetidine Dextromethorphan Dofetilide Hydrochlorothiazide Ketamine Metformin Methazolamide Quinidine Ranitidine Sodium bicarbonate Triamterene This list may not describe all possible interactions. Give your health care provider a list of all the medicines, herbs, non-prescription drugs, or dietary supplements you use. Also tell them if you smoke, drink alcohol, or use illegal drugs. Some items may interact with your medicine. What should I watch for while using this medication? Visit your care team for regular checks on your progress. Check with your care team if there is no improvement in your symptoms or if they get worse. This medication may affect your coordination, reaction time, or judgment. Do not drive or operate machinery until you know how this medication affects you. Sit up or stand slowly to reduce the risk of dizzy or fainting spells. Drinking alcohol with this medication can increase the risk of these side effects. What side effects may I notice from receiving this medication? Side effects that you  should report to your care team as soon as possible: Allergic reactions--skin rash, itching, hives, swelling of the face, lips, tongue, or throat Side effects that usually do not require medical attention  (report to your care team if they continue or are bothersome): Confusion Constipation Diarrhea Dizziness Headache This list may not describe all possible side effects. Call your doctor for medical advice about side effects. You may report side effects to FDA at 1-800-FDA-1088. Where should I keep my medication? Keep out of the reach of children. Store at room temperature between 15 degrees and 30 degrees C (59 degrees and 86 degrees F). Throw away any unused medication after the expiration date. NOTE: This sheet is a summary. It may not cover all possible information. If you have questions about this medicine, talk to your doctor, pharmacist, or health care provider.  2024 Elsevier/Gold Standard (2021-04-16 00:00:00)

## 2023-03-20 LAB — DEMENTIA PANEL
Homocysteine: 15.9 umol/L (ref 0.0–21.3)
RPR Ser Ql: NONREACTIVE
TSH: 1.99 u[IU]/mL (ref 0.450–4.500)
Vitamin B-12: 365 pg/mL (ref 232–1245)

## 2023-03-21 NOTE — Progress Notes (Signed)
Kindly inform the patient that cholesterol profile is satisfactory

## 2023-03-23 ENCOUNTER — Telehealth: Payer: Self-pay

## 2023-03-23 NOTE — Telephone Encounter (Signed)
-----   Message from Julie Sosa sent at 03/21/2023 11:56 AM EST ----- Kindly inform the patient that cholesterol profile is satisfactory

## 2023-03-23 NOTE — Telephone Encounter (Signed)
I spoke with the patient and informed her of lab results. She verbalized understanding of the results and expressed appreciation for the call.

## 2023-04-13 ENCOUNTER — Encounter: Payer: Self-pay | Admitting: Neurology

## 2023-04-14 NOTE — Telephone Encounter (Signed)
 Spoke  to daughter Judith) (checked DPR) Daughter had questions about Further testing that was mentioned in last office visit. Informed daughter  reviewed last office note and other further testing I seen was EEG which is on 04/2023. Will forward question about further testing  beside EEG to dr Rosemarie today Did review Namenda  dosage orders to daughter and what side effects to look for . Daughter states pt doing well on medication so far. Daughter expressed understanding and thanked me for calling

## 2023-04-28 ENCOUNTER — Ambulatory Visit: Payer: Medicare Other | Admitting: Neurology

## 2023-04-28 DIAGNOSIS — R4182 Altered mental status, unspecified: Secondary | ICD-10-CM | POA: Diagnosis not present

## 2023-04-28 DIAGNOSIS — I6381 Other cerebral infarction due to occlusion or stenosis of small artery: Secondary | ICD-10-CM

## 2023-04-28 DIAGNOSIS — R413 Other amnesia: Secondary | ICD-10-CM

## 2023-05-06 NOTE — Progress Notes (Signed)
Kindly inform the patient that EEG of brainwave study was normal.  No evidence of seizure activity noted.

## 2023-05-07 ENCOUNTER — Encounter: Payer: Self-pay | Admitting: Neurology

## 2023-05-25 ENCOUNTER — Ambulatory Visit: Payer: Medicare Other | Admitting: Neurology

## 2023-05-26 ENCOUNTER — Ambulatory Visit: Payer: Medicare Other | Admitting: Neurology

## 2023-07-16 ENCOUNTER — Ambulatory Visit: Admitting: Urology

## 2023-07-16 DIAGNOSIS — N3941 Urge incontinence: Secondary | ICD-10-CM

## 2023-07-16 NOTE — Progress Notes (Deleted)
 Name: Julie Sosa DOB: 05/26/1935 MRN: [id number]  History of Present Illness: [name] is a 88 y.o. female who presents today as a new patient at [facility] [place]. All available relevant medical records have been reviewed. She is accompanied by ***, who assists with providing history due to patient's dementia. GU History includes: 1. ***.  Today: She reports chief complaint of ***urinary incontinence.  She {Actions; denies-reports:120008} urge incontinence. She {Actions; denies-reports:120008} stress incontinence with ***cough/***laugh/***sneeze/***heavy lifting /***exercise. She reports the ***SUI / ***UUI is predominant.  She leaks *** times per ***. Wears *** ***pads/ ***diapers per day. She reports urinary incontinence {ACTION; IS/IS GEX:[id number]} significantly bothersome.   She reports urinary ***frequency, ***nocturia, and ***urgency. Voiding ***x/day and ***x/night on average.   She {Actions; denies-reports:120008} prior attempted treatment for these symptoms ***including ***  She {Actions; denies-reports:120008} significant caffeine intake (*** caffeinated beverages per day on average). She {does/does not:200015} take a daily diuretic use.  She {Actions; denies-reports:120008} history of obstructive sleep apnea; {Actions; denies-reports:120008} wearing CPAP routinely.  ***She denies ever having a sleep study before. She {Actions; denies-reports:120008} fluid intake within 3 hours prior to bedtime. She {Actions; denies-reports:120008} fluid intake during the night.  She {Actions; denies-reports:120008} caffeine intake within 8 hours prior to bedtime. She {Actions; denies-reports:120008} routinely experiencing lower extremity edema during the day. ***She {Actions; denies-reports:120008} elevating feet during the day and/or wearing compression socks when lower extremity edema is present during the day. ***She {Actions; denies-reports:120008} monitoring  dietary salt and sodium intake.  She {Actions; denies-reports:120008} dysuria, gross hematuria, straining to void, or sensations of incomplete emptying.   Medications: Current Outpatient Medications  Medication Sig Dispense Refill   AMBULATORY NON FORMULARY MEDICATION Medication Name: Nitroglycerin ointment 0.125 mg apply a pea size amount rectally three times a day for 6-8 weeks. 1 Tube 1   anastrozole (ARIMIDEX) 1 MG tablet Take 1 tablet (1 mg total) by mouth daily. 90 tablet 3   atenolol (TENORMIN) 50 MG tablet Take 50 mg by mouth daily. In am.     BAYER ASPIRIN PO Take by mouth.     fluorometholone (FML) 0.1 % ophthalmic suspension Place 0.1 mLs into the right eye 1 day or 1 dose.     fluticasone (FLONASE) 50 MCG/ACT nasal spray 2 sprays daily.     gabapentin (NEURONTIN) 100 MG capsule      hydrochlorothiazide (HYDRODIURIL) 25 MG tablet Take 25 mg by mouth daily. In am.     memantine (NAMENDA TITRATION PAK) tablet pack 5 mg/day for =1 week; 5 mg twice daily for =1 week; 15 mg/day given in 5 mg and 10 mg separated doses for =1 week; then 10 mg twice daily 49 tablet 12   memantine (NAMENDA) 10 MG tablet Take 1 tablet (10 mg total) by mouth 2 (two) times daily. 60 tablet 3   neomycin-polymyxin-hydrocortisone (CORTISPORIN) 3.5-10000-1 OTIC suspension Apply 1-2 drops daily after soaking and cover with bandaid 10 mL 0   ofloxacin (OCUFLOX) 0.3 % ophthalmic solution PLACE 1 GTT OD TID FOR 5 DAYS     omeprazole (PRILOSEC) 40 MG capsule TAKE 1 CAPSULE(40 MG) BY MOUTH DAILY 90 capsule 2   oxybutynin (DITROPAN) 5 MG tablet Take 5 mg by mouth daily.     Potassium Chloride ER 20 MEQ TBCR TAKE 2 TABLETS BY MOUTH EVERY MORNING AND 1 TABLET EVERY EVENING     rOPINIRole (REQUIP) 1 MG tablet Take 1 mg by mouth daily at 8 pm.      sertraline (  ZOLOFT) 100 MG tablet Take 100 mg by mouth daily. In am.     simvastatin (ZOCOR) 20 MG tablet Take 20 mg by mouth daily. In am.     traZODone (DESYREL) 100 MG tablet  Take 100 mg by mouth at bedtime.   2   No current facility-administered medications for this visit.    Allergies: Allergies  Allergen Reactions   Cefuroxime Rash    Past Medical History:  Diagnosis Date   Anemia    Anxiety    Arthritis    osteoarthritis   Breast cancer (HCC)    bilat 2018   Cancer (HCC)    melanoma right arm   Depression    Family history of breast cancer    Family history of lung cancer    Gallstone    GERD (gastroesophageal reflux disease)    occasionally uses tums or rolaids   Headache    Hypercholesteremia    Hypertension    Melanoma (HCC) 11/13/2010   in situ right forearm- exc   Personal history of radiation therapy    Restless leg syndrome    UTI (urinary tract infection)    Past Surgical History:  Procedure Laterality Date   ABDOMINAL HYSTERECTOMY     BREAST CYST EXCISION     BREAST LUMPECTOMY Bilateral    2018   BREAST LUMPECTOMY WITH RADIOACTIVE SEED AND SENTINEL LYMPH NODE BIOPSY Bilateral 10/15/2016   Procedure: RIGHT BREAST LUMPECTOMY WITH BRACKETED RADIOACTIVE SEEDS AND RIGHT SENTINEL LYMPH NODE BIOPSY, LEFT BREAST LUMPECTOMY WITH RADIOACTIVE SEED AND LEFT SENTINEL LYMPH NODE BIOPSY;  Surgeon: Almond Lint, MD;  Location: [facility] OR;  Service: General;  Laterality: Bilateral;   BREAST SURGERY     left breast cyst removed   CATARACT EXTRACTION W/PHACO  03/11/2012   Procedure: CATARACT EXTRACTION PHACO AND INTRAOCULAR LENS PLACEMENT (IOC);  Surgeon: Gemma Payor, MD;  Location: [facility];  Service: Ophthalmology;  Laterality: Right;  CDE:17.71   CATARACT EXTRACTION W/PHACO  03/29/2012   Procedure: CATARACT EXTRACTION PHACO AND INTRAOCULAR LENS PLACEMENT (IOC);  Surgeon: Gemma Payor, MD;  Location: [facility];  Service: Ophthalmology;  Laterality: Left;  CDE:17.49   CHOLECYSTECTOMY  10 yrs ago   [facility]   COLONOSCOPY     before 2009   EYE SURGERY Bilateral    cataract extraction   MUSCLE BIOPSY     removal of cyst-right calf   TOTAL HIP  ARTHROPLASTY Left 12/25/2014   Procedure: TOTAL HIP ARTHROPLASTY;  Surgeon: Donato Heinz, MD;  Location: [facility] ORS;  Service: Orthopedics;  Laterality: Left;   TOTAL HIP ARTHROPLASTY Right 06/23/2016   Procedure: TOTAL HIP ARTHROPLASTY;  Surgeon: Donato Heinz, MD;  Location: [facility];  Service: Orthopedics;  Laterality: Right;   YAG LASER APPLICATION Right 10/22/2015   Procedure: YAG LASER APPLICATION;  Surgeon: Susa Simmonds, MD;  Location: [facility];  Service: Ophthalmology;  Laterality: Right;   YAG LASER APPLICATION Left 11/26/2015   Procedure: YAG LASER APPLICATION;  Surgeon: Susa Simmonds, MD;  Location: [facility];  Service: Ophthalmology;  Laterality: Left;   Family History  Problem Relation Age of Onset   Cancer Brother        lung cancer. Never smoked or drink    Cancer Other 60       breast cancer    Cancer Sister 58       breast cancer    Breast cancer Sister    Thyroid cancer Sister    Colon cancer Neg Hx  Esophageal cancer Neg Hx    Social History   Socioeconomic History   Marital status: Widowed    Spouse name: Not on file   Number of children: Not on file   Years of education: Not on file   Highest education level: Not on file  Occupational History   Occupation: Retired  Tobacco Use   Smoking status: Never   Smokeless tobacco: Never  Vaping Use   Vaping status: Never Used  Substance and Sexual Activity   Alcohol use: Not Currently   Drug use: Never   Sexual activity: Yes    Birth control/protection: Surgical  Other Topics Concern   Not on file  Social History Narrative   Pt lives alone   Retired    [employer] Strain: Not on file  Food Insecurity: Not on file  Transportation Needs: Not on file  Physical Activity: Not on file  Stress: Not on file  Social Connections: Not on file  Intimate Partner Violence: Not on file    SUBJECTIVE  Review of Systems Constitutional: Patient denies any unintentional  weight loss or change in strength lntegumentary: Patient denies any rashes or pruritus Cardiovascular: Patient denies chest pain or syncope Respiratory: Patient denies shortness of breath***eyes Gastrointestinal: ***Patient {Actions; denies-reports:120008} ***nausea, ***vomiting, ***constipation, ***diarrhea ***As per HPI Musculoskeletal: Patient denies muscle cramps or weakness Neurologic: Patient denies convulsions or seizures Allergic/Immunologic: Patient denies recent allergic reaction(s) Hematologic/Lymphatic: Patient denies bleeding tendencies Endocrine: Patient denies heat/cold intolerance  GU: As per HPI.  OBJECTIVE There were no vitals filed for this visit. There is no height or weight on file to calculate BMI.  Physical Examination Constitutional: No obvious distress; patient is non-toxic appearing  Cardiovascular: No visible lower extremity edema.  Respiratory: The patient does not have audible wheezing/stridor; respirations do not appear labored  Gastrointestinal: Abdomen non-distended Musculoskeletal: Normal ROM of UEs  Skin: No obvious rashes/open sores  Neurologic: CN 2-12 grossly intact Psychiatric: Answered questions appropriately with normal affect  Hematologic/Lymphatic/Immunologic: No obvious bruises or sites of spontaneous bleeding  UA: ***negative ***positive for *** leukocytes, *** blood, ***nitrites Urine microscopy: *** WBC/hpf, *** RBC/hpf, *** bacteria ***otherwise unremarkable ***glucosuria (secondary to ***Jardiance ***Farxiga use)  PVR: *** ml  ASSESSMENT Urge incontinence  We discussed the different forms of urinary incontinence, such as stress and urge incontinence, and described how symptoms are consistent with mixed urinary incontinence.  Stress urinary incontinence: The etiology of this condition was explained in detail to include pelvic floor muscle relaxation and detachment of the urethra away from its connection to the pubic bone. Her  risk profile was reviewed, including childbirth.  ***A visual demonstration of the pathophysiology of stress urinary incontinence was reviewed.  The management options were reviewed to include: No intervention, observation. Non-surgical options: Pelvic floor muscle rehabilitation Weight loss Incontinence pessary Surgical consultation  ***Options may include a midurethral sling (with synthetic mesh implant or autologous fascial sling), a Burch urethropexy, or transurethral injection of a bulking agent. Detailed discussion was had regarding the potential risks of both procedures, including: mesh exposure, mesh erosion, failure to resolve SUI, ineffectiveness of these procedures for urge UI, dyspareunia, voiding dysfunction and/or retention.  She elected to proceed with ***.  OAB with urinary frequency, nocturia, urgency, and urge incontinence: We discussed the symptoms of overactive bladder (OAB), which include urinary urgency, frequency, nocturia, with or without urge incontinence.  While we may not know the exact etiology of OAB, several risk factors can be identified.  -  Patient's neurogenic risk factors: ***T2DM ***with neuropathy, ***nicotine use, ***spinal stenosis, ***prior stroke, ***dementia.  - Patient's exacerbating factors include: ***diuretic use, ***caffeine intake, ***glucosuria (due to ***Jardiance / ***Farxiga use), ***ambulatory dysfunction (functional incontinence).   We discussed the following management options in detail including potential benefits, risks, and side effects: Behavioral therapy: Modify fluid intake Minimize / avoid bladder irritants (such as caffeine, spicy foods, acidic foods, alcohol) Bladder retraining / timed voiding Double voiding Medication(s): ***- We discussed potential side effects of anticholinergic medications such as urinary retention, dry eyes, dry mouth, constipation, confusion, cognitive impairment / dementia.  ***- Not a safe candidate for  anticholinergic medications due to risk for side effects based on patient's age, comorbidities, and pre-existing ***dry mouth ***dry eyes ***constipation ***dementia ***Parkinsons disease ***MS.  ***- Beta-3 agonist medications: We discussed potential side effects of beta-3 agonist medications such as urinary retention and (infrequently) elevated blood pressure.  ***- Combination therapy with anticholinergic medication + beta-3 agonist medication. 3. For refractory cases: PTNS (posterior tibial nerve stimulation) ***Not a safe candidate for PTNS due to ***bleeding disorder, ***anticoagulant use, ***pregnancy, ***pacemaker, ***implanted cardiac defibrillator (ICD), ***neuropathy / nerve damage / nerve conduction disorder, ***lower extremity metal implant(s).  Sacral neuromodulation trial (Medtronic lnterStim or Axonics implant) Bladder Botox injections  ***Consider discussing possible alternatives to ***Jardiance ***Farxiga with prescribing provider. This medication causes excess sugar to be excreted into the urine. That can prompt the kidneys to put out more water to dilute that sugar in the urine and it can also irritate the bladder lining, both of which may contribute to OAB symptoms (urinary frequency, urgency, and urge incontinence).  She decided to proceed with *** ***behavioral modifications including ***minimizing / avoiding caffeine intake and working on ***timed voiding / bladder retraining.  Will plan for follow up in *** weeks / *** months or sooner if needed. Pt verbalized understanding and agreement. All questions were answered.   PLAN Advised the following: ***. *** ***. Minimize / avoid caffeine intake. ***. Work on timed voiding. ***. No follow-ups on file.  No orders of the defined types were placed in this encounter.   It has been explained that the patient is to follow regularly with their PCP in addition to all other providers involved in their care and to follow  instructions provided by these respective offices. Patient advised to contact urology clinic if any urologic-pertaining questions, concerns, new symptoms or problems arise in the interim period.  There are no Patient Instructions on file for this visit.  Electronically signed by:  Donnita Falls, MSN, FNP-C, CUNP 07/16/2023 10:08 AM

## 2023-08-29 ENCOUNTER — Ambulatory Visit: Payer: Self-pay

## 2023-08-29 ENCOUNTER — Ambulatory Visit
Admission: EM | Admit: 2023-08-29 | Discharge: 2023-08-29 | Disposition: A | Discharge status: Home / Self Care | Attending: Nurse Practitioner | Admitting: Nurse Practitioner

## 2023-08-29 ENCOUNTER — Encounter: Payer: Self-pay | Admitting: Emergency Medicine

## 2023-08-29 DIAGNOSIS — N3000 Acute cystitis without hematuria: Secondary | ICD-10-CM | POA: Diagnosis present

## 2023-08-29 LAB — POCT URINALYSIS DIP (MANUAL ENTRY)
Bilirubin, UA: NEGATIVE
Glucose, UA: NEGATIVE mg/dL
Ketones, POC UA: NEGATIVE mg/dL
Nitrite, UA: POSITIVE — AB
Protein Ur, POC: >=300 mg/dL — AB
Spec Grav, UA: >=1.03 — AB (ref 1.010–1.025)
Urobilinogen, UA: 2 U/dL — AB
pH, UA: 6 (ref 5.0–8.0)

## 2023-08-29 MED ORDER — NITROFURANTOIN MONOHYD MACRO 100 MG PO CAPS: 100.0000 mg | ORAL_CAPSULE | Freq: Two times a day (BID) | ORAL | 0 refills | Status: AC

## 2023-08-29 NOTE — ED Triage Notes (Signed)
 Burning on urination with frequency x 1 week

## 2023-08-29 NOTE — ED Provider Notes (Signed)
 [facility]    CSN: [id number] Arrival date & time: 08/29/23  1154      History   Chief Complaint No chief complaint on file.   HPI Julie  Melven Stable Sosa is a 88 y.o. female.   The history is provided by the patient and a relative (Daughter).   Patient presents with a 1 week history of burning with urination, urinary frequency, and suprapubic pressure.  Patient denies fever, hematuria, decreased urine stream, low back pain, flank pain, abdominal pain, nausea, vomiting, or diarrhea.  Daughter reports patient did complain of chills last evening.  Daughter reports patient has had a UTI within the past 1 to 2 months.  Daughter states patient sees Dr. Hildy Lowers and urology referral was previously discussed.  Patient with history of recurrent UTIs. Past Medical History:  Diagnosis Date   Anemia    Anxiety    Arthritis    osteoarthritis   Breast cancer (HCC)    bilat 2018   Cancer (HCC)    melanoma right arm   Depression    Family history of breast cancer    Family history of lung cancer    Gallstone    GERD (gastroesophageal reflux disease)    occasionally uses tums or rolaids   Headache    Hypercholesteremia    Hypertension    Melanoma (HCC) 11/13/2010   in situ right forearm- exc   Personal history of radiation therapy    Restless leg syndrome    UTI (urinary tract infection)     Patient Active Problem List   Diagnosis Date Noted   Gastroesophageal reflux disease 06/02/2018   Rhinitis, chronic 06/02/2018   Hoarseness of voice 06/02/2018   Sinus drainage 06/02/2018   Genetic testing 05/18/2018   Family history of breast cancer    Family history of lung cancer    Depression 04/21/2018   Hyperlipidemia 04/21/2018   Bilateral breast cancer (HCC) 10/23/2016   Breast cancer of lower-outer quadrant of left female breast (HCC) 10/23/2016   Breast cancer of upper-inner quadrant of right female breast (HCC) 10/23/2016   Degenerative joint disease (DJD) of hip  12/25/2014   S/P total hip arthroplasty 12/25/2014    Past Surgical History:  Procedure Laterality Date   ABDOMINAL HYSTERECTOMY     BREAST CYST EXCISION     BREAST LUMPECTOMY Bilateral    2018   BREAST LUMPECTOMY WITH RADIOACTIVE SEED AND SENTINEL LYMPH NODE BIOPSY Bilateral 10/15/2016   Procedure: RIGHT BREAST LUMPECTOMY WITH BRACKETED RADIOACTIVE SEEDS AND RIGHT SENTINEL LYMPH NODE BIOPSY, LEFT BREAST LUMPECTOMY WITH RADIOACTIVE SEED AND LEFT SENTINEL LYMPH NODE BIOPSY;  Surgeon: Lockie Rima, MD;  Location: [facility];  Service: General;  Laterality: Bilateral;   BREAST SURGERY     left breast cyst removed   CATARACT EXTRACTION W/PHACO  03/11/2012   Procedure: CATARACT EXTRACTION PHACO AND INTRAOCULAR LENS PLACEMENT (IOC);  Surgeon: Anner Kill, MD;  Location: [facility];  Service: Ophthalmology;  Laterality: Right;  CDE:17.71   CATARACT EXTRACTION W/PHACO  03/29/2012   Procedure: CATARACT EXTRACTION PHACO AND INTRAOCULAR LENS PLACEMENT (IOC);  Surgeon: Anner Kill, MD;  Location: [facility];  Service: Ophthalmology;  Laterality: Left;  CDE:17.49   CHOLECYSTECTOMY  10 yrs ago   [facility]   COLONOSCOPY     before 2009   EYE SURGERY Bilateral    cataract extraction   MUSCLE BIOPSY     removal of cyst-right calf   TOTAL HIP ARTHROPLASTY Left 12/25/2014   Procedure: TOTAL HIP ARTHROPLASTY;  Surgeon:  Arlyne Lame, MD;  Location: [facility];  Service: Orthopedics;  Laterality: Left;   TOTAL HIP ARTHROPLASTY Right 06/23/2016   Procedure: TOTAL HIP ARTHROPLASTY;  Surgeon: Arlyne Lame, MD;  Location: [facility];  Service: Orthopedics;  Laterality: Right;   YAG LASER APPLICATION Right 10/22/2015   Procedure: YAG LASER APPLICATION;  Surgeon: Clay Cummins, MD;  Location: [facility];  Service: Ophthalmology;  Laterality: Right;   YAG LASER APPLICATION Left 11/26/2015   Procedure: YAG LASER APPLICATION;  Surgeon: Clay Cummins, MD;  Location: [facility];  Service: Ophthalmology;  Laterality: Left;    OB  History   No obstetric history on file.      Home Medications    Prior to Admission medications   Medication Sig Start Date End Date Taking? Authorizing Provider  nitrofurantoin, macrocrystal-monohydrate, (MACROBID) 100 MG capsule Take 1 capsule (100 mg total) by mouth 2 (two) times daily for 10 days. 08/29/23 09/08/23 Yes Leath-Warren, Belen Bowers, NP  AMBULATORY NON FORMULARY MEDICATION Medication Name: Nitroglycerin ointment 0.125 mg apply a pea size amount rectally three times a day for 6-8 weeks. 03/09/18   Lindle Rhea, MD  anastrozole  (ARIMIDEX ) 1 MG tablet Take 1 tablet (1 mg total) by mouth daily. 06/11/20   Burton, Lacie K, NP  atenolol  (TENORMIN ) 50 MG tablet Take 50 mg by mouth daily. In am.    [provider]  BAYER ASPIRIN PO Take by mouth.    [provider]  fluorometholone (FML) 0.1 % ophthalmic suspension Place 0.1 mLs into the right eye 1 day or 1 dose. 10/07/19   [provider]  fluticasone (FLONASE) 50 MCG/ACT nasal spray 2 sprays daily.    [provider]  gabapentin (NEURONTIN) 100 MG capsule  04/02/19   [provider]  hydrochlorothiazide  (HYDRODIURIL ) 25 MG tablet Take 25 mg by mouth daily. In am.    [provider]  memantine  (NAMENDA  TITRATION PAK) tablet pack 5 mg/day for =1 week; 5 mg twice daily for =1 week; 15 mg/day given in 5 mg and 10 mg separated doses for =1 week; then 10 mg twice daily 03/19/23   Sethi, Pramod S, MD  memantine  (NAMENDA ) 10 MG tablet Take 1 tablet (10 mg total) by mouth 2 (two) times daily. 03/19/23   Sethi, Pramod S, MD  ofloxacin (OCUFLOX) 0.3 % ophthalmic solution PLACE 1 GTT OD TID FOR 5 DAYS 03/14/19   [provider]  omeprazole  (PRILOSEC) 40 MG capsule TAKE 1 CAPSULE(40 MG) BY MOUTH DAILY 03/18/21   Lindle Rhea, MD  oxybutynin (DITROPAN) 5 MG tablet Take 5 mg by mouth daily.    [provider]  Potassium Chloride  ER 20 MEQ TBCR TAKE 2 TABLETS BY MOUTH EVERY  MORNING AND 1 TABLET EVERY EVENING 08/12/19   [provider]  rOPINIRole  (REQUIP ) 1 MG tablet Take 1 mg by mouth daily at 8 pm.     [provider]  sertraline  (ZOLOFT ) 100 MG tablet Take 100 mg by mouth daily. In am.    [provider]  traZODone  (DESYREL ) 100 MG tablet Take 100 mg by mouth at bedtime.  05/09/16   [provider]    Family History Family History  Problem Relation Age of Onset   Cancer Brother        lung cancer. Never smoked or drink    Cancer Other 60       breast cancer    Cancer Sister 2       breast  cancer    Breast cancer Sister    Thyroid  cancer Sister    Colon cancer Neg Hx    Esophageal cancer Neg Hx     Social History Social History   Tobacco Use   Smoking status: Never   Smokeless tobacco: Never  Vaping Use   Vaping status: Never Used  Substance Use Topics   Alcohol  use: Not Currently   Drug use: Never     Allergies   Cefuroxime   Review of Systems Review of Systems Per HPI  Physical Exam Triage Vital Signs ED Triage Vitals  Encounter Vitals Group     BP 08/29/23 1204 99/66     Systolic BP Percentile --      Diastolic BP Percentile --      Pulse Rate 08/29/23 1204 72     Resp 08/29/23 1204 18     Temp 08/29/23 1204 98.5 F (36.9 C)     Temp Source 08/29/23 1204 Oral     SpO2 08/29/23 1204 95 %     Weight --      Height --      Head Circumference --      Peak Flow --      Pain Score 08/29/23 1205 0     Pain Loc --      Pain Education --      Exclude from Growth Chart --    No data found.  Updated Vital Signs BP 99/66 (BP Location: Right Arm)   Pulse 72   Temp 98.5 F (36.9 C) (Oral)   Resp 18   SpO2 95%   Visual Acuity Right Eye Distance:   Left Eye Distance:   Bilateral Distance:    Right Eye Near:   Left Eye Near:    Bilateral Near:     Physical Exam Vitals and nursing note reviewed.  Constitutional:      General: She is not in acute distress.    Appearance: Normal  appearance.  HENT:     Head: Normocephalic.  Eyes:     Extraocular Movements: Extraocular movements intact.     Conjunctiva/sclera: Conjunctivae normal.     Pupils: Pupils are equal, round, and reactive to light.  Cardiovascular:     Rate and Rhythm: Normal rate and regular rhythm.     Pulses: Normal pulses.     Heart sounds: Normal heart sounds.  Pulmonary:     Effort: Pulmonary effort is normal.     Breath sounds: Normal breath sounds.  Abdominal:     General: Bowel sounds are normal.     Palpations: Abdomen is soft.     Tenderness: There is no abdominal tenderness. There is no right CVA tenderness or left CVA tenderness.  Musculoskeletal:     Cervical back: Normal range of motion.  Lymphadenopathy:     Cervical: No cervical adenopathy.  Skin:    General: Skin is warm and dry.  Neurological:     General: No focal deficit present.     Mental Status: She is alert and oriented to person, place, and time.  Psychiatric:        Mood and Affect: Mood normal.        Behavior: Behavior normal.      UC Treatments / Results  Labs (all labs ordered are listed, but only abnormal results are displayed) Labs Reviewed  POCT URINALYSIS DIP (MANUAL ENTRY) - Abnormal; Notable for the following components:      Result Value   Clarity, UA  cloudy (*)    Spec Grav, UA >=1.030 (*)    Blood, UA large (*)    Protein Ur, POC >=300 (*)    Urobilinogen, UA 2.0 (*)    Nitrite, UA Positive (*)    Leukocytes, UA Small (1+) (*)    All other components within normal limits    EKG   Radiology No results found.  Procedures Procedures (including critical care time)  Medications Ordered in UC Medications - No data to display  Initial Impression / Assessment and Plan / UC Course  I have reviewed the triage vital signs and the nursing notes.  Pertinent labs & imaging results that were available during my care of the patient were reviewed by me and considered in my medical decision making  (see chart for details).  Urinalysis consistent with a urinary tract infection.  Presence of leukocytes, nitrites, protein, and blood consistent with acute cystitis.  Will treat with Macrobid 100 mg twice daily.  Supportive care recommendations were provided and discussed with patient and daughter to include over-the-counter Tylenol , fluids, avoiding caffeine, and developing a toileting schedule.  Discussed indications with patient's daughter regarding ER follow-up.  Daughter advised to follow-up with PCP regarding referral to urology.  Patient and daughter were in agreement with this plan of care and verbalized understanding.  All questions were answered.  Patient stable for discharge.  Final Clinical Impressions(s) / UC Diagnoses   Final diagnoses:  Acute cystitis without hematuria     Discharge Instructions      The urinalysis shows you do have a urinary tract infection. A urine culture has been ordered.  You will be contacted if the urine culture result shows that the medication needs to be changed.  If so, a new prescription will be sent to your preferred pharmacy. You may take over-the-counter Tylenol  as needed for pain, fever, or general discomfort. Make sure you are drinking at least 5-7 8 ounce glasses of water daily while symptoms persist. Avoid caffeine such as tea, soda, or coffee while symptoms persist. Go to the emergency department immediately if you experience worsening urinary symptoms or develop new symptoms of fever, chills, or disorientation. Follow-up with your PCP to discuss referral to urology. Follow-up as needed.   ED Prescriptions     Medication Sig Dispense Auth. Provider   nitrofurantoin, macrocrystal-monohydrate, (MACROBID) 100 MG capsule Take 1 capsule (100 mg total) by mouth 2 (two) times daily for 10 days. 20 capsule Leath-Warren, Belen Bowers, NP      PDMP not reviewed this encounter.   Hardy Lia, NP 08/29/23 1257

## 2023-08-29 NOTE — Discharge Instructions (Addendum)
 The urinalysis shows you do have a urinary tract infection. A urine culture has been ordered.  You will be contacted if the urine culture result shows that the medication needs to be changed.  If so, a new prescription will be sent to your preferred pharmacy. You may take over-the-counter Tylenol  as needed for pain, fever, or general discomfort. Make sure you are drinking at least 5-7 8 ounce glasses of water daily while symptoms persist. Avoid caffeine such as tea, soda, or coffee while symptoms persist. Go to the emergency department immediately if you experience worsening urinary symptoms or develop new symptoms of fever, chills, or disorientation. Follow-up with your PCP to discuss referral to urology. Follow-up as needed.

## 2023-08-31 ENCOUNTER — Ambulatory Visit (HOSPITAL_COMMUNITY): Payer: Self-pay

## 2023-08-31 LAB — URINE CULTURE: Culture: 80000 — AB

## 2023-10-29 DIAGNOSIS — Z8673 Personal history of transient ischemic attack (TIA), and cerebral infarction without residual deficits: Secondary | ICD-10-CM | POA: Insufficient documentation

## 2023-10-29 NOTE — Progress Notes (Signed)
 Name: Julie  LUNABELLA Sosa DOB: Sep 01, 1935 MRN: [id number]  History of Present Illness: Ms. Sosa is a 88 y.o. female who presents today as a new patient at [facility] [place]. All available relevant medical records have been reviewed. She is accompanied by her daughter Julie, who assists with providing history due to patient's dementia.  Today: She reports urinary frequency, nocturia x2-3, urgency, and urge incontinence. She denies stress incontinence. She leaks multiple times per day. Wears 3-4 diapers per day. She reports urinary incontinence is significantly bothersome.   She has been taking Ditropan (Oxybutynin) 5 mg daily with mild improvement.  She denies significant caffeine intake. She takes a daily diuretic (hydrochlorothiazide ).  She denies dysuria, gross hematuria, straining to void, or sensations of incomplete emptying.  Medications: Current Outpatient Medications  Medication Sig Dispense Refill   anastrozole  (ARIMIDEX ) 1 MG tablet Take 1 tablet (1 mg total) by mouth daily. 90 tablet 3   atenolol  (TENORMIN ) 50 MG tablet Take 50 mg by mouth daily. In am.     BAYER ASPIRIN PO Take by mouth.     hydrochlorothiazide  (HYDRODIURIL ) 25 MG tablet Take 25 mg by mouth daily. In am.     memantine  (NAMENDA  TITRATION PAK) tablet pack 5 mg/day for =1 week; 5 mg twice daily for =1 week; 15 mg/day given in 5 mg and 10 mg separated doses for =1 week; then 10 mg twice daily 49 tablet 12   omeprazole  (PRILOSEC) 40 MG capsule TAKE 1 CAPSULE(40 MG) BY MOUTH DAILY 90 capsule 2   Potassium Chloride  ER 20 MEQ TBCR TAKE 2 TABLETS BY MOUTH EVERY MORNING AND 1 TABLET EVERY EVENING     rOPINIRole  (REQUIP ) 1 MG tablet Take 1 mg by mouth daily at 8 pm.      sertraline  (ZOLOFT ) 100 MG tablet Take 100 mg by mouth daily. In am.     traZODone  (DESYREL ) 100 MG tablet Take 100 mg by mouth at bedtime.   2   Vibegron  (GEMTESA ) 75 MG TABS Take 1 tablet (75 mg total) by mouth daily. 30 tablet 11    AMBULATORY NON FORMULARY MEDICATION Medication Name: Nitroglycerin ointment 0.125 mg apply a pea size amount rectally three times a day for 6-8 weeks. (Patient not taking: Reported on 11/02/2023) 1 Tube 1   atorvastatin (LIPITOR) 80 MG tablet Take 80 mg by mouth daily.     fluorometholone (FML) 0.1 % ophthalmic suspension Place 0.1 mLs into the right eye 1 day or 1 dose. (Patient not taking: Reported on 11/02/2023)     fluticasone (FLONASE) 50 MCG/ACT nasal spray 2 sprays daily. (Patient not taking: Reported on 11/02/2023)     gabapentin (NEURONTIN) 100 MG capsule  (Patient not taking: Reported on 11/02/2023)     memantine  (NAMENDA ) 10 MG tablet Take 1 tablet (10 mg total) by mouth 2 (two) times daily. (Patient not taking: Reported on 11/02/2023) 60 tablet 3   ofloxacin (OCUFLOX) 0.3 % ophthalmic solution PLACE 1 GTT OD TID FOR 5 DAYS (Patient not taking: Reported on 11/02/2023)     QUEtiapine (SEROQUEL) 25 MG tablet Take 25 mg by mouth at bedtime.     No current facility-administered medications for this visit.    Allergies: Allergies  Allergen Reactions   Cefuroxime Rash    Past Medical History:  Diagnosis Date   Anemia    Anxiety    Arthritis    osteoarthritis   Breast cancer (HCC)    bilat 2018   Cancer (HCC)  melanoma right arm   Depression    Family history of breast cancer    Family history of lung cancer    Gallstone    GERD (gastroesophageal reflux disease)    occasionally uses tums or rolaids   Headache    Hypercholesteremia    Hypertension    Melanoma (HCC) 11/13/2010   in situ right forearm- exc   Personal history of radiation therapy    Restless leg syndrome    UTI (urinary tract infection)    Past Surgical History:  Procedure Laterality Date   ABDOMINAL HYSTERECTOMY     BREAST CYST EXCISION     BREAST LUMPECTOMY Bilateral    2018   BREAST LUMPECTOMY WITH RADIOACTIVE SEED AND SENTINEL LYMPH NODE BIOPSY Bilateral 10/15/2016   Procedure: RIGHT BREAST  LUMPECTOMY WITH BRACKETED RADIOACTIVE SEEDS AND RIGHT SENTINEL LYMPH NODE BIOPSY, LEFT BREAST LUMPECTOMY WITH RADIOACTIVE SEED AND LEFT SENTINEL LYMPH NODE BIOPSY;  Surgeon: Aron Shoulders, MD;  Location: [facility];  Service: General;  Laterality: Bilateral;   BREAST SURGERY     left breast cyst removed   CATARACT EXTRACTION W/PHACO  03/11/2012   Procedure: CATARACT EXTRACTION PHACO AND INTRAOCULAR LENS PLACEMENT (IOC);  Surgeon: Cherene Mania, MD;  Location: [facility];  Service: Ophthalmology;  Laterality: Right;  CDE:17.71   CATARACT EXTRACTION W/PHACO  03/29/2012   Procedure: CATARACT EXTRACTION PHACO AND INTRAOCULAR LENS PLACEMENT (IOC);  Surgeon: Cherene Mania, MD;  Location: [facility];  Service: Ophthalmology;  Laterality: Left;  CDE:17.49   CHOLECYSTECTOMY  10 yrs ago   [facility]   COLONOSCOPY     before 2009   EYE SURGERY Bilateral    cataract extraction   MUSCLE BIOPSY     removal of cyst-right calf   TOTAL HIP ARTHROPLASTY Left 12/25/2014   Procedure: TOTAL HIP ARTHROPLASTY;  Surgeon: Lynwood SHAUNNA Hue, MD;  Location: [facility];  Service: Orthopedics;  Laterality: Left;   TOTAL HIP ARTHROPLASTY Right 06/23/2016   Procedure: TOTAL HIP ARTHROPLASTY;  Surgeon: Lynwood SHAUNNA Hue, MD;  Location: [facility];  Service: Orthopedics;  Laterality: Right;   YAG LASER APPLICATION Right 10/22/2015   Procedure: YAG LASER APPLICATION;  Surgeon: Dow JULIANNA Burke, MD;  Location: [facility];  Service: Ophthalmology;  Laterality: Right;   YAG LASER APPLICATION Left 11/26/2015   Procedure: YAG LASER APPLICATION;  Surgeon: Dow JULIANNA Burke, MD;  Location: [facility];  Service: Ophthalmology;  Laterality: Left;   Family History  Problem Relation Age of Onset   Cancer Brother        lung cancer. Never smoked or drink    Cancer Other 60       breast cancer    Cancer Sister 71       breast cancer    Breast cancer Sister    Thyroid  cancer Sister    Colon cancer Neg Hx    Esophageal cancer Neg Hx    Social History   Socioeconomic  History   Marital status: Widowed    Spouse name: Not on file   Number of children: Not on file   Years of education: Not on file   Highest education level: Not on file  Occupational History   Occupation: Retired  Tobacco Use   Smoking status: Never   Smokeless tobacco: Never  Vaping Use   Vaping status: Never Used  Substance and Sexual Activity   Alcohol  use: Not Currently   Drug use: Never   Sexual activity: Yes    Birth control/protection: Surgical  Other  Topics Concern   Not on file  Social History Narrative   Pt lives alone   Retired    Social Drivers of Corporate investment banker Strain: Low Risk  (10/02/2023)   Received from [facility]   Overall Financial Resource Strain (CARDIA)    Difficulty of Paying Living Expenses: Not hard at all  Food Insecurity: No Food Insecurity (10/02/2023)   Received from [facility]   Hunger Vital Sign    Within the past 12 months, you worried that your food would run out before you got the money to buy more.: Never true    Within the past 12 months, the food you bought just didn't last and you didn't have money to get more.: Never true  Transportation Needs: No Transportation Needs (10/02/2023)   Received from [facility] - Transportation    In the past 12 months, has lack of transportation kept you from medical appointments or from getting medications?: No    Lack of Transportation (Non-Medical): No  Physical Activity: Not on file  Stress: Not on file  Social Connections: Not on file  Intimate Partner Violence: Not on file    SUBJECTIVE  Review of Systems Constitutional: Patient denies any unintentional weight loss or change in strength lntegumentary: Patient denies any rashes or pruritus Eyes: Patient reports dry eyes ENT: Patient reports dry mouth Cardiovascular: Patient denies chest pain or syncope Respiratory: Patient denies shortness of  breath Gastrointestinal: Patient reports intermittent constipation  Musculoskeletal: Patient denies muscle cramps or weakness Neurologic: Patient denies convulsions or seizures Allergic/Immunologic: Patient denies recent allergic reaction(s) Hematologic/Lymphatic: Patient denies bleeding tendencies Endocrine: Patient denies heat/cold intolerance  GU: As per HPI.  OBJECTIVE Vitals:   11/02/23 1102  BP: 118/78  Pulse: 61   There is no height or weight on file to calculate BMI.  Physical Examination Constitutional: No obvious distress; patient is non-toxic appearing  Cardiovascular: No visible lower extremity edema.  Respiratory: The patient does not have audible wheezing/stridor; respirations do not appear labored  Gastrointestinal: Abdomen non-distended Musculoskeletal: Normal ROM of UEs  Skin: No obvious rashes/open sores  Neurologic: CN 2-12 grossly intact Psychiatric: Answered questions appropriately with normal affect  Hematologic/Lymphatic/Immunologic: No obvious bruises or sites of spontaneous bleeding  UA: negative  PVR: 5 ml  ASSESSMENT OAB (overactive bladder) - Plan: Vibegron  (GEMTESA ) 75 MG TABS  Urge incontinence of urine - Plan: BLADDER SCAN AMB NON-IMAGING, Urinalysis, Routine w reflex microscopic, Vibegron  (GEMTESA ) 75 MG TABS  History of stroke  We discussed the symptoms of overactive bladder (OAB), which include urinary urgency, frequency, nocturia, with or without urge incontinence. While we may not know the exact etiology of OAB, several risk factors can be identified.  - Patient's neurogenic risk factors: prior stroke, dementia.  - Patient's exacerbating factors include: diuretic use (hydrochlorothiazide ), ambulatory dysfunction (functional incontinence).   We discussed the following management options in detail including potential benefits, risks, and side effects: Behavioral therapy: Modify fluid intake Minimize / avoid bladder irritants (such as  caffeine) Bladder retraining / timed voiding Double voiding Medication(s): - Not a safe candidate for anticholinergic medications due to risk for side effects based on patient's age, comorbidities, and pre-existing dry mouth, dry eyes, constipation, dementia . Advised to discontinue Ditropan (Oxybutynin) 5 mg daily. - Beta-3 agonist medications: We discussed potential side effects of beta-3 agonist medications such as urinary retention and (infrequently) elevated blood pressure.   She decided to proceed with trial  of Gemtesa  (Vibegron ) 75 mg daily.   Will plan for follow up in 6 weeks or sooner if needed. Patient verbalized understanding and agreement. All questions were answered.  PLAN Advised the following: 1. Discontinue Ditropan (Oxybutynin) 5 mg daily. 2. Gemtesa  (Vibegron ) 75 mg daily.  3. Minimize / avoid caffeine intake. 4. Work on timed voiding. 5. Return in about 6 weeks (around 12/14/2023) for OAB, with UA & PVR.  Orders Placed This Encounter  Procedures   Urinalysis, Routine w reflex microscopic   BLADDER SCAN AMB NON-IMAGING    It has been explained that the patient is to follow regularly with their PCP in addition to all other providers involved in their care and to follow instructions provided by these respective offices. Patient advised to contact urology clinic if any urologic-pertaining questions, concerns, new symptoms or problems arise in the interim period.  Patient Instructions      Electronically signed by:  Lauraine KYM Oz, MSN, FNP-C, CUNP 11/02/2023 11:37 AM

## 2023-10-30 ENCOUNTER — Other Ambulatory Visit: Payer: Self-pay | Admitting: Internal Medicine

## 2023-10-30 DIAGNOSIS — Z1231 Encounter for screening mammogram for malignant neoplasm of breast: Secondary | ICD-10-CM

## 2023-11-02 ENCOUNTER — Ambulatory Visit: Admitting: Urology

## 2023-11-02 ENCOUNTER — Encounter: Payer: Self-pay | Admitting: Urology

## 2023-11-02 VITALS — BP 118/78 | HR 61

## 2023-11-02 DIAGNOSIS — Z8673 Personal history of transient ischemic attack (TIA), and cerebral infarction without residual deficits: Secondary | ICD-10-CM | POA: Diagnosis not present

## 2023-11-02 DIAGNOSIS — N3281 Overactive bladder: Secondary | ICD-10-CM | POA: Insufficient documentation

## 2023-11-02 DIAGNOSIS — N3941 Urge incontinence: Secondary | ICD-10-CM | POA: Insufficient documentation

## 2023-11-02 LAB — BLADDER SCAN AMB NON-IMAGING: Scan Result: 5

## 2023-11-02 MED ORDER — GEMTESA 75 MG PO TABS
1.0000 | ORAL_TABLET | Freq: Every day | ORAL | 11 refills | Status: DC
Start: 2023-11-02 — End: 2023-12-09

## 2023-11-02 NOTE — Patient Instructions (Signed)
 SABRA

## 2023-11-03 LAB — URINALYSIS, ROUTINE W REFLEX MICROSCOPIC
Bilirubin, UA: NEGATIVE
Glucose, UA: NEGATIVE
Ketones, UA: NEGATIVE
Leukocytes,UA: NEGATIVE
Nitrite, UA: NEGATIVE
Protein,UA: NEGATIVE
RBC, UA: NEGATIVE
Specific Gravity, UA: 1.025 (ref 1.005–1.030)
Urobilinogen, Ur: 1 mg/dL (ref 0.2–1.0)
pH, UA: 6 (ref 5.0–7.5)

## 2023-11-24 ENCOUNTER — Inpatient Hospital Stay
Admission: RE | Admit: 2023-11-24 | Discharge: 2023-11-24 | Discharge status: Home / Self Care | Source: Ambulatory Visit | Attending: Internal Medicine | Admitting: Internal Medicine

## 2023-11-24 DIAGNOSIS — Z1231 Encounter for screening mammogram for malignant neoplasm of breast: Secondary | ICD-10-CM

## 2023-12-08 ENCOUNTER — Telehealth: Payer: Self-pay | Admitting: Urology

## 2023-12-08 MED ORDER — GEMTESA 75 MG PO TABS: 75.0000 mg | ORAL_TABLET | Freq: Every day | ORAL | Status: AC

## 2023-12-08 NOTE — Telephone Encounter (Signed)
 Pt/daughter is made aware Samples of Gemtesa  up front

## 2023-12-08 NOTE — Telephone Encounter (Signed)
 Patient calling the office for samples of medication:   1.  What medication and dosage are you requesting samples for? Gemtesa   2.  Are you currently out of this medication?  Yes needs enough to get to her October appt with Dahlstedt

## 2023-12-09 ENCOUNTER — Ambulatory Visit: Payer: Medicare Other | Admitting: Neurology

## 2023-12-09 ENCOUNTER — Encounter: Payer: Self-pay | Admitting: Neurology

## 2023-12-09 VITALS — BP 108/70 | HR 83 | Ht 61.0 in | Wt 143.4 lb

## 2023-12-09 DIAGNOSIS — I69311 Memory deficit following cerebral infarction: Secondary | ICD-10-CM

## 2023-12-09 DIAGNOSIS — G309 Alzheimer's disease, unspecified: Secondary | ICD-10-CM | POA: Diagnosis not present

## 2023-12-09 DIAGNOSIS — F0280 Dementia in other diseases classified elsewhere without behavioral disturbance: Secondary | ICD-10-CM | POA: Diagnosis not present

## 2023-12-09 DIAGNOSIS — Z8673 Personal history of transient ischemic attack (TIA), and cerebral infarction without residual deficits: Secondary | ICD-10-CM

## 2023-12-09 DIAGNOSIS — F0150 Vascular dementia without behavioral disturbance: Secondary | ICD-10-CM | POA: Diagnosis not present

## 2023-12-09 MED ORDER — ASPIRIN 81 MG PO TBEC: 81.0000 mg | DELAYED_RELEASE_TABLET | Freq: Every day | ORAL | 12 refills | Status: AC

## 2023-12-09 NOTE — Progress Notes (Signed)
 [facility] [address] [place]. [place] [address] [phone]       OFFICE FOLLOW-UP VISIT NOTE  [name] Date of Birth:  [birth year] Medical Record Number:  [id number]   Referring MD: Gaither Langton  Reason for Referral: Stroke  HPI: Initial visit 03/19/2023 Julie Sosa is a pleasant 88 year old Caucasian lady seen today for initial office consultation visit.  She is accompanied by his daughter.  History is obtained from them and review of electronic medical records.  I personally reviewed pertinent available imaging films in PACS.  She has past medical history of hypertension, hyperlipidemia, gastroesophageal flux disease, arthritis, anemia, restless leg syndrome. Patient daughter states that they noticed that her confusion and disorientation which came on suddenly 1 day 3 months ago.  When the daughter went to her home she found the patient to not be able to understand very well and if kept repeating herself and was disoriented.  She had taken to days worth of medicine at the same time which is unusual for her.  Since her confusion persisted this her primary physician will order outpatient MRI which was done on 12/26/2022 and showed right thalamic capsular infarct.  CT angiogram of the brain and neck on 01/06/2023 showed no significant large vessel stenosis or occlusion.  Patient's confusion and memory and cognitive difficulties appear to have persisted and seem to be getting worse.  She occasionally sees a dead husband talks to her.  She gets days and nights mixed.  She is still living alone.  She is still some driving but has not gotten lost.  Needs some help with activities of daily living like setting her meals and medications.  Daughter is concerned about her decline in cognition feels she may need more help.  There is no prior history of stroke, significant head injury with loss of consciousness, seizures or headaches.  There is no family history of dementia. Update  12/09/2023 : She returns for follow-up after last visit 8 months ago.  She is accompanied by his her daughter.  Patient was started on Namenda  at last visit and she is tolerating 10 mg twice daily well without side effects however family is not sure if they have noticed significant benefit.  Patient states she has good days and bad days.  She continues to have short-term memory difficulties and trouble remembering things.  She does have times and delusions and hallucinations.  She does frequent sundowning as well.  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  Her daughter has taken over doing her financials.  Patient states she does do some puzzles and does some coloring books every day.  She does not have any family history of Alzheimer's.  The patient however did not do well on the cognitive testing today and scored 23/30 which is a decline from 26/30 at last visit.  She is able to name 94 animals in Kloefkorn score of only 1/4.  Patient has not had any recurrent stroke or TIA symptoms.  She remains on aspirin  she is tolerating well without side effects. ROS:   14 system review of systems is positive for confusion, disorientation, memory loss and hallucinations and all of systems negative  PMH:  Past Medical History:  Diagnosis Date   Anemia    Anxiety    Arthritis    osteoarthritis   Breast cancer (HCC)    bilat 2018   Cancer (HCC)    melanoma right arm  Depression    Family history of breast cancer    Family history of lung cancer    Gallstone    GERD (gastroesophageal reflux disease)    occasionally uses tums or rolaids   Headache    Hypercholesteremia    Hypertension    Melanoma (HCC) 11/13/2010   in situ right forearm- exc   Personal history of radiation therapy    Restless leg syndrome    UTI (urinary tract infection)     Social History:  Social History   Socioeconomic History   Marital status: Widowed    Spouse name: Not on file   Number  of children: Not on file   Years of education: Not on file   Highest education level: Not on file  Occupational History   Occupation: Retired  Tobacco Use   Smoking status: Never   Smokeless tobacco: Never  Vaping Use   Vaping status: Never Used  Substance and Sexual Activity   Alcohol  use: Not Currently   Drug use: Never   Sexual activity: Yes    Birth control/protection: Surgical  Other Topics Concern   Not on file  Social History Narrative   Pt lives alone   Retired    Social Drivers of Corporate investment banker Strain: Low Risk  (10/02/2023)   Received from [facility]   Overall Financial Resource Strain (CARDIA)    Difficulty of Paying Living Expenses: Not hard at all  Food Insecurity: No Food Insecurity (10/02/2023)   Received from [facility]   Hunger Vital Sign    Within the past 12 months, you worried that your food would run out before you got the money to buy more.: Never true    Within the past 12 months, the food you bought just didn't last and you didn't have money to get more.: Never true  Transportation Needs: No Transportation Needs (10/02/2023)   Received from [facility] - Transportation    In the past 12 months, has lack of transportation kept you from medical appointments or from getting medications?: No    Lack of Transportation (Non-Medical): No  Physical Activity: Not on file  Stress: Not on file  Social Connections: Not on file  Intimate Partner Violence: Not on file    Medications:   Current Outpatient Medications on File Prior to Visit  Medication Sig Dispense Refill   anastrozole  (ARIMIDEX ) 1 MG tablet Take 1 tablet (1 mg total) by mouth daily. 90 tablet 3   atenolol  (TENORMIN ) 50 MG tablet Take 50 mg by mouth daily. In am.     atorvastatin (LIPITOR) 80 MG tablet Take 80 mg by mouth daily.     BAYER ASPIRIN  PO Take by mouth.     Cholecalciferol (VITAMIN D3) 125 MCG (5000 UT)  CAPS Take 1 capsule by mouth daily.     hydrochlorothiazide  (HYDRODIURIL ) 25 MG tablet Take 25 mg by mouth daily. In am.     memantine  (NAMENDA  TITRATION PAK) tablet pack 5 mg/day for =1 week; 5 mg twice daily for =1 week; 15 mg/day given in 5 mg and 10 mg separated doses for =1 week; then 10 mg twice daily 49 tablet 12   omeprazole  (PRILOSEC) 40 MG capsule TAKE 1 CAPSULE(40 MG) BY MOUTH DAILY 90 capsule 2   Potassium Chloride  ER 20 MEQ TBCR TAKE 2 TABLETS BY MOUTH EVERY MORNING AND 1 TABLET EVERY EVENING     QUEtiapine (SEROQUEL) 25 MG tablet  Take 25 mg by mouth at bedtime.     rOPINIRole  (REQUIP ) 1 MG tablet Take 1 mg by mouth daily at 8 pm.      sertraline  (ZOLOFT ) 100 MG tablet Take 100 mg by mouth daily. In am.     traZODone  (DESYREL ) 100 MG tablet Take 100 mg by mouth at bedtime.   2   Vibegron  (GEMTESA ) 75 MG TABS Take 1 tablet (75 mg total) by mouth daily.     No current facility-administered medications on file prior to visit.    Allergies:   Allergies  Allergen Reactions   Cefuroxime Rash    Physical Exam General: Mildly obese pleasant elderly Caucasian lady seated, in no evident distress Head: head normocephalic and atraumatic.   Neck: supple with no carotid or supraclavicular bruits Cardiovascular: regular rate and rhythm, no murmurs Musculoskeletal: no deformity Skin:  no rash/petichiae Vascular:  Normal pulses all extremities  Neurologic Exam Mental Status: Awake and fully alert. Oriented to place and time. Recent and remote memory i diminished attention span, concentration and fund of knowledge poor. Mood and affect appropriate.  Diminished recall 2/3.  Clock drawing 3/4.  Able to name 10 animals which can walk on 4 legs.  Mini-Mental status exam score 26/30. Cranial Nerves: Fundoscopic exam reveals sharp disc margins. Pupils equal, briskly reactive to light. Extraocular movements full without nystagmus. Visual fields full to confrontation. Hearing slightly diminished  bilaterally facial sensation intact. Face, tongue, palate moves normally and symmetrically.  Motor: Normal bulk and tone. Normal strength in all tested extremity muscles. Sensory.: intact to touch , pinprick , position and vibratory sensation.  Coordination: Rapid alternating movements normal in all extremities. Finger-to-nose and heel-to-shin performed accurately bilaterally. Gait and Station: Arises from chair without difficulty. Stance is normal. Gait is broad-based and slightly ataxic.  Unable to stand on either foot unsupported..  Not able to heel, toe and tandem walk without difficulty.  Reflexes: 1+ and symmetric. Toes downgoing.   NIHSS  2 Modified Rankin  2     12/09/2023    2:39 PM 03/19/2023   10:31 AM  MMSE - Mini Mental State Exam  Orientation to time 2 4  Orientation to Place 5 5  Registration 3 3  Attention/ Calculation 4 5  Recall 1 1  Language- name 2 objects 2 2  Language- repeat 1 1  Language- follow 3 step command 3 3  Language- read & follow direction 1 1  Write a sentence 1 1  Copy design 0 0  Total score 23 26    ASSESSMENT: 88 year old Caucasian lady with memory and cognitive worsening following right thalamic lacunar infarct from small vessel disease in September 2024 likely due to mild cognitive impairment/mild vascular dementia.  Vascular risk factors of hypertension mild obesity and age     PLAN:I had a long d/w patient and her daughter about her recent stroke and memory loss and cognitive deterioration likely due to mixed dementia , risk for recurrent stroke/TIAs, personally independently reviewed imaging studies and stroke evaluation results and answered questions.Continue aspirin  81 mg daily   for secondary stroke prevention and maintain strict control of hypertension with blood pressure goal below 130/90, diabetes with hemoglobin A1c goal below 6.5% and lipids with LDL cholesterol goal below 70 mg/dL. I also advised the patient to eat a healthy diet with  plenty of whole grains, cereals, fruits and vegetables, exercise regularly and maintain ideal body weight . Check ATN profile and PET amyloid scan for Alzheimer's and if  positive will consider treatment with  Kisunla or Leqembi if approved .continue 24-hour care at home and if patient deteriorates may have to move into memory care unit soon.  Patient encouraged to increase participation in cognitive challenging activities like following course of puzzles, playing bridge and sudoku discussed memory compensation strategies as well.  Followup in the future with me in 6 months or call earlier if needed.      I personally spent a total of 40 minutes in the care of the patient today including getting/reviewing separately obtained history, performing a medically appropriate exam/evaluation, counseling and educating, placing orders, referring and communicating with other health care professionals, documenting clinical information in the EHR, independently interpreting results, and coordinating care.        Eather Popp, MD Note: This document was prepared with digital dictation and possible smart phrase technology. Any transcriptional errors that result from this process are unintentional.

## 2023-12-09 NOTE — Patient Instructions (Addendum)
 I had a long d/w patient and her daughter about her recent stroke and memory loss and cognitive deterioration likely due to mixed dementia , risk for recurrent stroke/TIAs, personally independently reviewed imaging studies and stroke evaluation results and answered questions.Continue aspirin  81 mg daily   for secondary stroke prevention and maintain strict control of hypertension with blood pressure goal below 130/90, diabetes with hemoglobin A1c goal below 6.5% and lipids with LDL cholesterol goal below 70 mg/dL. I also advised the patient to eat a healthy diet with plenty of whole grains, cereals, fruits and vegetables, exercise regularly and maintain ideal body weight . Check ATN panel and better myeloid for Alzheimer's and if positive will consider treatment with  Kisunla or Leqembi if approved .continue 24-hour care at home and if patient deteriorates may have to move into memory care unit soon.  Patient encouraged to increase participation in cognitive challenging activities like following course of puzzles, playing bridge and sudoku discussed memory compensation strategies as well.  Followup in the future with me in 6 months or call earlier if needed.  Dementia Caregiver Guide Dementia is a condition that affects the way the brain works. It often affects thinking and memory. A person with dementia may: Forget things. Have trouble talking or responding to your questions. Have trouble paying attention. Have trouble thinking clearly and making good decisions. Get lost or wander away from home or other places. Have big changes in their mood or emotions. They may: Feel very worried, nervous, or depressed. Have angry outbursts. Be suspicious or accuse you of things. Have childlike behavior and language. Taking care of someone with dementia can be a challenge. The tips below can help you care for the person. How to help manage lifestyle changes Dementia usually gets worse slowly over time. In the  early stages, people with dementia can stay safe and take care of themselves with some help. In later stages, they need help with daily tasks like getting dressed, grooming, and going to the bathroom. Communicating When the person is talking and seems frustrated, make eye contact and hold the person's hand. Ask questions that can be answered with a yes or no. Use simple words and a calm voice. Only give one direction at a time. Limit choices for the person. Too many choices can be stressful. Avoid correcting the person in a negative way. If the person can't find the right words, gently try to help. Preventing injury  Keep floors clear. Remove rugs, magazine racks, and floor lamps. Keep hallways well lit, especially at night. Put a handrail and nonslip mat in the bathtub or shower. Put childproof locks on cabinets that have dangerous items in them. These items include medicine, alcohol , guns, cleaning products, and sharp tools. For doors to the outside, put locks where the person can't see or reach them. This helps keep the person from going out of the house and getting lost. Be ready for emergencies. Keep a list of emergency phone numbers and addresses close by. Remove car keys and lock garage doors so the person doesn't try to drive. Have the person wear a bracelet that tracks where they are and shows that they're a person with memory loss. This should be worn at all times for safety. Helping with daily life  Keep the person on track with their daily routine. Try to identify areas where the person may need help. Be supportive, patient, calm, and encouraging. Gently remind the person that adjusting to changes takes time. Help with the  tasks that the person has asked for help with. Keep the person involved in daily tasks and decisions as much as you can. Encourage conversation, but try not to get frustrated if the person struggles to find words or doesn't seem to appreciate your help. Other  tips Think about any safety risks and take steps to avoid them. Keep things organized: Organize medicines in a pill box for each day of the week. Keep a calendar in a central place. Use it to remind the person of health care visits or other activities. Create a plan to handle any legal or financial matters. Get help from a professional if needed. Help make sure the person: Takes medicines only as told by their health care providers. Eats regular, healthy meals. They should also drink plenty of fluids. Goes to all scheduled health care appointments. Gets regular sleep. Taking care of yourself Being a caregiver for someone with dementia can be hard. You may feel stressed and have many other emotions. It's important to also take care of yourself. Here are some tips: Find out about services that can provide short-term care for the person. This is called respite care. It can allow you to take a break when you need one. Find healthy ways to deal with stress. Some ways include: Spending time with other people. Exercising. Meditating or doing deep breathing exercises. Take care of your own health by: Getting enough sleep. Eating healthy foods. Getting regular exercise. Join a support group with others who are caregivers. These groups can help you: Learn other ways to deal with stress. Share experiences with others. Get emotional comfort and support. Learn about caregiving as the disease gets worse. Find resources in your community. Where to find support: Many people and organizations offer support. These include: Support groups for people with dementia. Support groups for caregivers. Counselors or therapists. Home health care services. Adult day care centers. Where to find more information Alzheimer's Association: WesternTunes.it Family Caregiver Alliance: caregiver.org Alzheimer's Foundation of [place]: alzfdn.org Contact a health care provider if: The person's health is quickly getting  worse. You're no longer able to care for the person. Caring for the person is affecting your physical and emotional health. You're feeling worried, nervous, or depressed about caring for the person. Get help right away if: You feel like the person may hurt themselves or others. The person has talked about taking their own life. These symptoms may be an emergency. Take one of these steps right away: Go to your nearest emergency room. Call 911. Call the National Suicide Prevention Lifeline at 623-231-6255 or 988. Text the Crisis Text Line at (762)805-8487. This information is not intended to replace advice given to you by your health care provider. Make sure you discuss any questions you have with your health care provider. Document Revised: 07/04/2022 Document Reviewed: 07/04/2022 Elsevier Patient Education  2024 [employer].

## 2023-12-10 ENCOUNTER — Telehealth: Payer: Self-pay | Admitting: Neurology

## 2023-12-10 NOTE — Telephone Encounter (Signed)
 [employer] medicare shara: [id number] exp. 12/10/23-01/24/24 sent to [facility] [phone]

## 2023-12-12 LAB — ATN PROFILE
A -- Beta-amyloid 42/40 Ratio: 0.105 (ref >0.102)
Beta-amyloid 40: 171.9 pg/mL
Beta-amyloid 42: 18.02 pg/mL
N -- NfL, Plasma: 3.98 pg/mL (ref 0.00–9.13)
T -- p-tau181: 1.66 pg/mL — ABNORMAL HIGH (ref 0.00–0.97)

## 2023-12-13 ENCOUNTER — Ambulatory Visit: Payer: Self-pay | Admitting: Neurology

## 2023-12-14 ENCOUNTER — Ambulatory Visit: Admitting: Urology

## 2023-12-14 NOTE — Telephone Encounter (Signed)
 Lvm 1st attempt 12/14/23 by hf

## 2023-12-14 NOTE — Telephone Encounter (Signed)
 Pt daughter returned call regarding lab results. Informed daughter labs results noted by provider lab work for ATN profile is not consistent with Alzheimer's related pathology in the brain. Pt daughter voiced understanding and asked if Pt should go forward with getting PET scan as it is scheduled for Fri 9/12 at 3pm. Informed dtr will send a message to provider to inquire and will let them know once we hear. Dtr voiced understanding and thanks for the info.

## 2023-12-16 NOTE — Telephone Encounter (Signed)
 Called the daughter back and advised that we would need to likely keep the PET imaging apt that she is scheduled for on 9/12 and that would be so that we can get a better idea on what if present on the brain etc. If it shows the protein biomarkers than we would be able to use the result from that testing to move forward with treatment. If its negative than that would be the better test to rule out. They verbalized understanding and will keep upcoming apt

## 2023-12-16 NOTE — Telephone Encounter (Signed)
-----   Message from Zelda KATHEE Louder sent at 12/15/2023  2:13 PM EDT -----  Patient's daughter, Dorthea calling in regards to if patient still need to get Pet Scan based lab results. Would like a call back.

## 2023-12-16 NOTE — Telephone Encounter (Signed)
 Pt is scheduled 9/12 for Pet imaging and should keep this appt in order to be able to get clear diagnosis and how to move forward.

## 2023-12-18 ENCOUNTER — Encounter (HOSPITAL_COMMUNITY): Admission: RE | Admit: 2023-12-18 | Source: Ambulatory Visit

## 2023-12-24 ENCOUNTER — Ambulatory Visit (HOSPITAL_COMMUNITY)
Admission: RE | Admit: 2023-12-24 | Discharge: 2023-12-24 | Disposition: A | Discharge status: Home / Self Care | Source: Ambulatory Visit | Attending: Neurology | Admitting: Neurology

## 2023-12-24 DIAGNOSIS — R413 Other amnesia: Secondary | ICD-10-CM | POA: Insufficient documentation

## 2023-12-24 DIAGNOSIS — G309 Alzheimer's disease, unspecified: Secondary | ICD-10-CM | POA: Insufficient documentation

## 2023-12-24 DIAGNOSIS — G319 Degenerative disease of nervous system, unspecified: Secondary | ICD-10-CM | POA: Diagnosis not present

## 2023-12-24 MED ORDER — FLORBETAPIR F 18 500-1900 MBQ/ML IV SOLN
9.9000 | Freq: Once | INTRAVENOUS | Status: AC
  Administered 2023-12-24: 9.9 via INTRAVENOUS

## 2024-01-24 NOTE — Progress Notes (Unsigned)
 History of Present Illness: Julie  CHRISTELLA Sosa is a 88 y.o. year old female here for f/u of OAB sx's.  Past Medical History:  Diagnosis Date   Anemia    Anxiety    Arthritis    osteoarthritis   Breast cancer (HCC)    bilat 2018   Cancer (HCC)    melanoma right arm   Depression    Family history of breast cancer    Family history of lung cancer    Gallstone    GERD (gastroesophageal reflux disease)    occasionally uses tums or rolaids   Headache    Hypercholesteremia    Hypertension    Melanoma (HCC) 11/13/2010   in situ right forearm- exc   Personal history of radiation therapy    Restless leg syndrome    UTI (urinary tract infection)     Past Surgical History:  Procedure Laterality Date   ABDOMINAL HYSTERECTOMY     BREAST CYST EXCISION     BREAST LUMPECTOMY Bilateral    2018   BREAST LUMPECTOMY WITH RADIOACTIVE SEED AND SENTINEL LYMPH NODE BIOPSY Bilateral 10/15/2016   Procedure: RIGHT BREAST LUMPECTOMY WITH BRACKETED RADIOACTIVE SEEDS AND RIGHT SENTINEL LYMPH NODE BIOPSY, LEFT BREAST LUMPECTOMY WITH RADIOACTIVE SEED AND LEFT SENTINEL LYMPH NODE BIOPSY;  Surgeon: Aron Shoulders, MD;  Location: [facility] OR;  Service: General;  Laterality: Bilateral;   BREAST SURGERY     left breast cyst removed   CATARACT EXTRACTION W/PHACO  03/11/2012   Procedure: CATARACT EXTRACTION PHACO AND INTRAOCULAR LENS PLACEMENT (IOC);  Surgeon: Cherene Mania, MD;  Location: [facility] ORS;  Service: Ophthalmology;  Laterality: Right;  CDE:17.71   CATARACT EXTRACTION W/PHACO  03/29/2012   Procedure: CATARACT EXTRACTION PHACO AND INTRAOCULAR LENS PLACEMENT (IOC);  Surgeon: Cherene Mania, MD;  Location: [facility] ORS;  Service: Ophthalmology;  Laterality: Left;  CDE:17.49   CHOLECYSTECTOMY  10 yrs ago   [facility]   COLONOSCOPY     before 2009   EYE SURGERY Bilateral    cataract extraction   MUSCLE BIOPSY     removal of cyst-right calf   TOTAL HIP ARTHROPLASTY Left 12/25/2014   Procedure: TOTAL HIP ARTHROPLASTY;   Surgeon: Lynwood SHAUNNA Hue, MD;  Location: [facility] ORS;  Service: Orthopedics;  Laterality: Left;   TOTAL HIP ARTHROPLASTY Right 06/23/2016   Procedure: TOTAL HIP ARTHROPLASTY;  Surgeon: Lynwood SHAUNNA Hue, MD;  Location: [facility] ORS;  Service: Orthopedics;  Laterality: Right;   YAG LASER APPLICATION Right 10/22/2015   Procedure: YAG LASER APPLICATION;  Surgeon: Dow JULIANNA Burke, MD;  Location: [facility];  Service: Ophthalmology;  Laterality: Right;   YAG LASER APPLICATION Left 11/26/2015   Procedure: YAG LASER APPLICATION;  Surgeon: Dow JULIANNA Burke, MD;  Location: [facility];  Service: Ophthalmology;  Laterality: Left;    Home Medications:  (Not in a hospital admission)   Allergies:  Allergies  Allergen Reactions   Cefuroxime Rash    Family History  Problem Relation Age of Onset   Breast cancer Sister 69   Thyroid  cancer Sister    Lung cancer Brother        Never smoked or drink   Breast cancer Niece 37   Colon cancer Neg Hx    Esophageal cancer Neg Hx     Social History:  reports that she has never smoked. She has never used smokeless tobacco. She reports that she does not currently use alcohol . She reports that she does not use drugs.  ROS: A complete review  of systems was performed.  All systems are negative except for pertinent findings as noted.  Physical Exam:  Vital signs in last 24 hours: @VSRANGES @ General:  Alert and oriented, No acute distress HEENT: Normocephalic, atraumatic Neck: No JVD or lymphadenopathy Cardiovascular: Regular rate  Lungs: Normal inspiratory/expiratory excursion Abdomen: Soft, nontender, nondistended, no abdominal masses Back: No CVA tenderness Extremities: No edema Neurologic: Grossly intact  I have reviewed prior pt notes  I have reviewed notes from referring/previous physicians  I have reviewed urinalysis results  I have independently reviewed prior imaging  I have reviewed prior urine culture   Impression/Assessment:  ***  Plan:   ***  Garnette HERO Kadin Canipe 01/24/2024, 6:52 PM  Garnette HERO. Wren Gallaga MD

## 2024-01-26 ENCOUNTER — Ambulatory Visit: Admitting: Urology

## 2024-01-26 VITALS — BP 155/77 | HR 65

## 2024-01-26 DIAGNOSIS — N3281 Overactive bladder: Secondary | ICD-10-CM

## 2024-01-26 LAB — URINALYSIS, ROUTINE W REFLEX MICROSCOPIC
Bilirubin, UA: NEGATIVE
Ketones, UA: NEGATIVE
Leukocytes,UA: NEGATIVE
Nitrite, UA: NEGATIVE
Protein,UA: NEGATIVE
RBC, UA: NEGATIVE
Specific Gravity, UA: 1.025 (ref 1.005–1.030)
Urobilinogen, Ur: 1 mg/dL (ref 0.2–1.0)
pH, UA: 5.5 (ref 5.0–7.5)

## 2024-01-26 MED ORDER — TROSPIUM CHLORIDE 20 MG PO TABS
20.0000 mg | ORAL_TABLET | Freq: Two times a day (BID) | ORAL | 11 refills | Status: AC
Start: 2024-01-26 — End: ?

## 2024-01-26 NOTE — Progress Notes (Signed)
 Bladder Scan completed today due to reason of OAB  Patient can void prior to the bladder scan. Bladder scan result: 0  Performed By: Exie T. CMA  Additional notes- Patient is scheduled to follow up with with MD

## 2024-03-17 ENCOUNTER — Other Ambulatory Visit (HOSPITAL_COMMUNITY): Payer: Self-pay | Admitting: Internal Medicine

## 2024-03-17 DIAGNOSIS — I471 Supraventricular tachycardia, unspecified: Secondary | ICD-10-CM

## 2024-03-21 ENCOUNTER — Ambulatory Visit (HOSPITAL_COMMUNITY): Admission: RE | Admit: 2024-03-21 | Discharge: 2024-03-21 | Attending: Internal Medicine | Admitting: Internal Medicine

## 2024-03-21 DIAGNOSIS — I471 Supraventricular tachycardia, unspecified: Secondary | ICD-10-CM

## 2024-03-21 DIAGNOSIS — I361 Nonrheumatic tricuspid (valve) insufficiency: Secondary | ICD-10-CM

## 2024-03-21 LAB — ECHOCARDIOGRAM COMPLETE
AR max vel: 2.08 cm2
AV Area VTI: 2.26 cm2
AV Area mean vel: 2.38 cm2
AV Mean grad: 2.7 mmHg
AV Peak grad: 5.9 mmHg
Ao pk vel: 1.22 m/s
Area-P 1/2: 1.86 cm2
S' Lateral: 2.8 cm

## 2024-03-21 MED ORDER — PERFLUTREN LIPID MICROSPHERE
1.0000 mL | INTRAVENOUS | Status: AC | PRN
  Administered 2024-03-21: 14:00:00 3 mL via INTRAVENOUS

## 2024-03-21 NOTE — Progress Notes (Signed)
*  PRELIMINARY RESULTS* Echocardiogram 2D Echocardiogram has been performed with Definity .  Julie Sosa 03/21/2024, 2:12 PM

## 2024-05-10 ENCOUNTER — Telehealth: Payer: Self-pay

## 2024-05-17 ENCOUNTER — Ambulatory Visit: Admitting: Urology

## 2024-06-03 ENCOUNTER — Ambulatory Visit: Admitting: Urology

## 2024-06-20 ENCOUNTER — Ambulatory Visit: Admitting: Neurology
